# Patient Record
Sex: Male | Born: 1960
Health system: Southern US, Community
[De-identification: ages and names within clinical notes are randomized; demographics above are authoritative.]

## PROBLEM LIST (undated history)

## (undated) DIAGNOSIS — I639 Cerebral infarction, unspecified: Secondary | ICD-10-CM

## (undated) DIAGNOSIS — R569 Unspecified convulsions: Secondary | ICD-10-CM

---

## 2007-08-02 ENCOUNTER — Emergency Department (HOSPITAL_COMMUNITY): Admission: EM | Admit: 2007-08-02 | Discharge: 2007-08-02 | Payer: Self-pay | Admitting: Family Medicine

## 2007-08-07 ENCOUNTER — Emergency Department (HOSPITAL_COMMUNITY): Admission: EM | Admit: 2007-08-07 | Discharge: 2007-08-07 | Payer: Self-pay | Admitting: Emergency Medicine

## 2007-08-14 ENCOUNTER — Emergency Department (HOSPITAL_COMMUNITY): Admission: EM | Admit: 2007-08-14 | Discharge: 2007-08-14 | Payer: Self-pay | Admitting: Family Medicine

## 2010-05-31 DIAGNOSIS — J189 Pneumonia, unspecified organism: Secondary | ICD-10-CM

## 2010-05-31 HISTORY — DX: Pneumonia, unspecified organism: J18.9

## 2011-02-22 LAB — I-STAT 8, (EC8 V) (CONVERTED LAB)
Acid-base deficit: 1
BUN: 10
Bicarbonate: 24.9 — ABNORMAL HIGH
Chloride: 109
Glucose, Bld: 94
HCT: 45
Hemoglobin: 15.3
Operator id: 151321
Potassium: 4.3
Sodium: 139
TCO2: 26
pCO2, Ven: 45.6
pH, Ven: 7.345 — ABNORMAL HIGH

## 2011-02-22 LAB — DIFFERENTIAL
Basophils Absolute: 0
Basophils Relative: 1
Eosinophils Absolute: 0.3
Eosinophils Relative: 6 — ABNORMAL HIGH
Lymphocytes Relative: 28
Lymphs Abs: 1.7
Monocytes Absolute: 0.5
Monocytes Relative: 9
Neutro Abs: 3.3
Neutrophils Relative %: 56

## 2011-02-22 LAB — CBC
HCT: 40.5
Hemoglobin: 13.6
MCHC: 33.7
MCV: 90.6
Platelets: 295
RBC: 4.47
RDW: 13
WBC: 5.9

## 2011-02-22 LAB — POCT I-STAT CREATININE
Creatinine, Ser: 1.2
Operator id: 151321

## 2012-02-19 ENCOUNTER — Emergency Department (HOSPITAL_COMMUNITY): Payer: Self-pay

## 2012-02-19 ENCOUNTER — Encounter (HOSPITAL_COMMUNITY): Payer: Self-pay | Admitting: *Deleted

## 2012-02-19 ENCOUNTER — Emergency Department (HOSPITAL_COMMUNITY)
Admission: EM | Admit: 2012-02-19 | Discharge: 2012-02-19 | Disposition: A | Discharge status: Home / Self Care | Payer: Self-pay | Attending: Emergency Medicine | Admitting: Emergency Medicine

## 2012-02-19 DIAGNOSIS — M25469 Effusion, unspecified knee: Secondary | ICD-10-CM | POA: Insufficient documentation

## 2012-02-19 DIAGNOSIS — M25569 Pain in unspecified knee: Secondary | ICD-10-CM | POA: Insufficient documentation

## 2012-02-19 LAB — BASIC METABOLIC PANEL
BUN: 12 mg/dL (ref 6–23)
CO2: 26 mEq/L (ref 19–32)
Calcium: 9.9 mg/dL (ref 8.4–10.5)
Chloride: 101 mEq/L (ref 96–112)
Creatinine, Ser: 1.17 mg/dL (ref 0.50–1.35)
GFR calc Af Amer: 82 mL/min — ABNORMAL LOW (ref >90)
GFR calc non Af Amer: 71 mL/min — ABNORMAL LOW (ref >90)
Glucose, Bld: 136 mg/dL — ABNORMAL HIGH (ref 70–99)
Potassium: 4.3 mEq/L (ref 3.5–5.1)
Sodium: 136 mEq/L (ref 135–145)

## 2012-02-19 LAB — CBC WITH DIFFERENTIAL/PLATELET
Basophils Absolute: 0 10*3/uL (ref 0.0–0.1)
Basophils Relative: 0 % (ref 0–1)
Eosinophils Absolute: 0 10*3/uL (ref 0.0–0.7)
Eosinophils Relative: 0 % (ref 0–5)
HCT: 41 % (ref 39.0–52.0)
Hemoglobin: 14.1 g/dL (ref 13.0–17.0)
Lymphocytes Relative: 8 % — ABNORMAL LOW (ref 12–46)
Lymphs Abs: 1 10*3/uL (ref 0.7–4.0)
MCH: 31 pg (ref 26.0–34.0)
MCHC: 34.4 g/dL (ref 30.0–36.0)
MCV: 90.1 fL (ref 78.0–100.0)
Monocytes Absolute: 1.2 10*3/uL — ABNORMAL HIGH (ref 0.1–1.0)
Monocytes Relative: 10 % (ref 3–12)
Neutro Abs: 10.4 10*3/uL — ABNORMAL HIGH (ref 1.7–7.7)
Neutrophils Relative %: 82 % — ABNORMAL HIGH (ref 43–77)
Platelets: 268 10*3/uL (ref 150–400)
RBC: 4.55 MIL/uL (ref 4.22–5.81)
RDW: 12.1 % (ref 11.5–15.5)
WBC: 12.6 10*3/uL — ABNORMAL HIGH (ref 4.0–10.5)

## 2012-02-19 LAB — URIC ACID: Uric Acid, Serum: 5.5 mg/dL (ref 4.0–7.8)

## 2012-02-19 LAB — SEDIMENTATION RATE: Sed Rate: 10 mm/hr (ref 0–16)

## 2012-02-19 MED ORDER — OXYCODONE-ACETAMINOPHEN 5-325 MG PO TABS
2.0000 | ORAL_TABLET | Freq: Once | ORAL | Status: AC
  Administered 2012-02-19: 2 via ORAL
  Filled 2012-02-19: qty 2

## 2012-02-19 MED ORDER — OXYCODONE-ACETAMINOPHEN 5-325 MG PO TABS: ORAL_TABLET | ORAL | Status: DC

## 2012-02-19 NOTE — ED Notes (Signed)
Pt c/o R knee pain and swelling starting yesterday afternoon when driving a new vehicle. Pt states pain decreased last night and worsened today after driving same car again. Pt last took 800 mg ibuprofen today at 1400 and applied ice w/o relief today. Pt states he is unable to bend, straighten, or bear weight on R knee at this time.

## 2012-02-19 NOTE — ED Provider Notes (Addendum)
History     CSN: [id number]  Arrival date & time 02/19/12  1818   First MD Initiated Contact with Patient 02/19/12 1924      Chief Complaint  Patient presents with  . Knee Pain    (Consider location/radiation/quality/duration/timing/severity/associated sxs/prior treatment) HPI  51 y.o. male in no acute dress complaining of pain to right knee worsening over the course of the last day. Patient denies any trauma. States that he was driving a new vehicle and it became uncomfortable yesterday. Denies trauma. Pain is minimally relieved with 800mg  Ibuprofen. Denies fever, penile discharge, history of STDs  History reviewed. No pertinent past medical history.  History reviewed. No pertinent past surgical history.  History reviewed. No pertinent family history.  History  Substance Use Topics  . Smoking status: Former Smoker    Types: Cigars  . Smokeless tobacco: Not on file  . Alcohol Use: Yes     occasionally      Review of Systems  Constitutional: Negative for fever.  Respiratory: Negative for shortness of breath.   Cardiovascular: Negative for chest pain.  Gastrointestinal: Negative for nausea, vomiting, abdominal pain and diarrhea.  Musculoskeletal: Positive for arthralgias.  All other systems reviewed and are negative.    Allergies  Review of patient's allergies indicates no known allergies.  Home Medications   Current Outpatient Rx  Name Route Sig Dispense Refill  . IBUPROFEN 200 MG PO TABS Oral Take 800 mg by mouth every 6 (six) hours as needed. pain      BP 115/78  Pulse 85  Temp 98.3 F (36.8 C) (Oral)  Resp 20  SpO2 98%  Physical Exam  Nursing note and vitals reviewed. Constitutional: He is oriented to person, place, and time. He appears well-developed and well-nourished. No distress.  HENT:  Head: Normocephalic.  Eyes: Conjunctivae normal and EOM are normal.  Cardiovascular: Normal rate.   Pulmonary/Chest: Effort normal. No stridor.  Abdominal:  Soft.  Musculoskeletal: Normal range of motion.       Right Knee: No deformity, erythema or abrasions. trace warmth  FROM.  mild effusion, No crepitance. Anterior and posterior drawer show no abnormal laxity. Stable to valgus and varus stress. Joint lines are non-tender. Neurovascularly intact. Pt ambulates with mildly antalgic gait.   Neurological: He is alert and oriented to person, place, and time.  Psychiatric: He has a normal mood and affect.    ED Course  Procedures (including critical care time)  Labs Reviewed  CBC WITH DIFFERENTIAL - Abnormal; Notable for the following:    WBC 12.6 (*)     Neutrophils Relative 82 (*)     Neutro Abs 10.4 (*)     Lymphocytes Relative 8 (*)     Monocytes Absolute 1.2 (*)     All other components within normal limits  BASIC METABOLIC PANEL - Abnormal; Notable for the following:    Glucose, Bld 136 (*)     GFR calc non Af Amer 71 (*)     GFR calc Af Amer 82 (*)     All other components within normal limits  URIC ACID  SEDIMENTATION RATE   Dg Knee Complete 4 Views Right  02/19/2012  *RADIOLOGY REPORT*  Clinical Data: Knee pain/swelling  RIGHT KNEE - COMPLETE 4+ VIEW  Comparison: None.  Findings: No fracture or dislocation is seen.  Mild degenerative changes in the patellofemoral joint. Suprapatellar and infrapatellar enthesopathy.  Moderate suprapatellar knee joint effusion.  IMPRESSION: No fracture or dislocation is seen.  Moderate suprapatellar  knee joint effusion.   Original Report Authenticated By: Charline Bills, M.D.      1. Knee pain, acute   2. Knee effusion       MDM  Afebrile male with significantly worsening right knee pain and swelling over the last 24 hours. The knee is warm to the touch.  X-ray is negative patient has a slightly elevated white blood cell count at 12.6. However uric acid and sedimentation rate are negative. Discussed case with attending Dr. Effie Shy who believes he is stable for DC with orthopedic  followup.  Patient will be given crutches, Ace wrap, prescription for Percocet and orthopedic referral.  New Prescriptions   OXYCODONE-ACETAMINOPHEN (PERCOCET/ROXICET) 5-325 MG PER TABLET    1 to 2 tabs PO q6hrs  PRN for pain        Wynetta Emery, PA-C 02/19/12 2228  Wynetta Emery, PA-C 03/15/12 1544

## 2012-02-22 NOTE — ED Provider Notes (Signed)
Medical screening examination/treatment/procedure(s) were performed by non-physician practitioner and as supervising physician I was immediately available for consultation/collaboration.  Flint Melter, MD 02/22/12 [phone]

## 2012-03-16 NOTE — ED Provider Notes (Signed)
Medical screening examination/treatment/procedure(s) were performed by non-physician practitioner and as supervising physician I was immediately available for consultation/collaboration.  Flint Melter, MD 03/16/12 [phone]

## 2016-03-20 ENCOUNTER — Emergency Department (HOSPITAL_COMMUNITY): Payer: Self-pay

## 2016-03-20 ENCOUNTER — Encounter (HOSPITAL_COMMUNITY): Payer: Self-pay | Admitting: *Deleted

## 2016-03-20 ENCOUNTER — Emergency Department (HOSPITAL_COMMUNITY)
Admission: EM | Admit: 2016-03-20 | Discharge: 2016-03-20 | Disposition: A | Discharge status: Home / Self Care | Payer: Self-pay | Attending: Emergency Medicine | Admitting: Emergency Medicine

## 2016-03-20 DIAGNOSIS — Z87891 Personal history of nicotine dependence: Secondary | ICD-10-CM | POA: Insufficient documentation

## 2016-03-20 DIAGNOSIS — J189 Pneumonia, unspecified organism: Secondary | ICD-10-CM | POA: Insufficient documentation

## 2016-03-20 LAB — URINALYSIS, ROUTINE W REFLEX MICROSCOPIC
Glucose, UA: NEGATIVE mg/dL
Ketones, ur: 40 mg/dL — AB
Nitrite: POSITIVE — AB
Protein, ur: 100 mg/dL — AB
Specific Gravity, Urine: 1.034 — ABNORMAL HIGH (ref 1.005–1.030)
pH: 6 (ref 5.0–8.0)

## 2016-03-20 LAB — COMPREHENSIVE METABOLIC PANEL
ALT: 69 U/L — ABNORMAL HIGH (ref 17–63)
AST: 38 U/L (ref 15–41)
Albumin: 4.2 g/dL (ref 3.5–5.0)
Alkaline Phosphatase: 75 U/L (ref 38–126)
Anion gap: 10 (ref 5–15)
BUN: 16 mg/dL (ref 6–20)
CO2: 24 mmol/L (ref 22–32)
Calcium: 9.4 mg/dL (ref 8.9–10.3)
Chloride: 101 mmol/L (ref 101–111)
Creatinine, Ser: 1.28 mg/dL — ABNORMAL HIGH (ref 0.61–1.24)
GFR calc Af Amer: >60 mL/min (ref >60)
GFR calc non Af Amer: >60 mL/min (ref >60)
Glucose, Bld: 112 mg/dL — ABNORMAL HIGH (ref 65–99)
Potassium: 3.9 mmol/L (ref 3.5–5.1)
Sodium: 135 mmol/L (ref 135–145)
Total Bilirubin: 2.4 mg/dL — ABNORMAL HIGH (ref 0.3–1.2)
Total Protein: 8.6 g/dL — ABNORMAL HIGH (ref 6.5–8.1)

## 2016-03-20 LAB — CBC WITH DIFFERENTIAL/PLATELET
Basophils Absolute: 0 10*3/uL (ref 0.0–0.1)
Basophils Relative: 0 %
Eosinophils Absolute: 0.1 10*3/uL (ref 0.0–0.7)
Eosinophils Relative: 1 %
HCT: 37.1 % — ABNORMAL LOW (ref 39.0–52.0)
Hemoglobin: 12.4 g/dL — ABNORMAL LOW (ref 13.0–17.0)
Lymphocytes Relative: 5 %
Lymphs Abs: 0.6 10*3/uL — ABNORMAL LOW (ref 0.7–4.0)
MCH: 30.5 pg (ref 26.0–34.0)
MCHC: 33.4 g/dL (ref 30.0–36.0)
MCV: 91.2 fL (ref 78.0–100.0)
Monocytes Absolute: 1.3 10*3/uL — ABNORMAL HIGH (ref 0.1–1.0)
Monocytes Relative: 11 %
Neutro Abs: 9.5 10*3/uL — ABNORMAL HIGH (ref 1.7–7.7)
Neutrophils Relative %: 83 %
Platelets: 290 10*3/uL (ref 150–400)
RBC: 4.07 MIL/uL — ABNORMAL LOW (ref 4.22–5.81)
RDW: 12.5 % (ref 11.5–15.5)
WBC: 11.5 10*3/uL — ABNORMAL HIGH (ref 4.0–10.5)

## 2016-03-20 LAB — I-STAT CG4 LACTIC ACID, ED: Lactic Acid, Venous: 1.62 mmol/L (ref 0.5–1.9)

## 2016-03-20 LAB — URINE MICROSCOPIC-ADD ON: Squamous Epithelial / LPF: NONE SEEN

## 2016-03-20 MED ORDER — SODIUM CHLORIDE 0.9 % IV BOLUS (SEPSIS)
1000.0000 mL | Freq: Once | INTRAVENOUS | Status: AC
  Administered 2016-03-20: 1000 mL via INTRAVENOUS

## 2016-03-20 MED ORDER — AZITHROMYCIN 250 MG PO TABS: 250.0000 mg | ORAL_TABLET | Freq: Every day | ORAL | 0 refills | Status: DC

## 2016-03-20 MED ORDER — ACETAMINOPHEN 325 MG PO TABS
650.0000 mg | ORAL_TABLET | Freq: Once | ORAL | Status: AC
  Administered 2016-03-20: 650 mg via ORAL
  Filled 2016-03-20: qty 2

## 2016-03-20 MED ORDER — DIPHENHYDRAMINE HCL 50 MG/ML IJ SOLN
25.0000 mg | Freq: Once | INTRAMUSCULAR | Status: AC
  Administered 2016-03-20: 25 mg via INTRAVENOUS
  Filled 2016-03-20: qty 1

## 2016-03-20 MED ORDER — METOCLOPRAMIDE HCL 5 MG/ML IJ SOLN
10.0000 mg | Freq: Once | INTRAMUSCULAR | Status: AC
  Administered 2016-03-20: 10 mg via INTRAVENOUS
  Filled 2016-03-20: qty 2

## 2016-03-20 MED ORDER — AZITHROMYCIN 250 MG PO TABS
500.0000 mg | ORAL_TABLET | Freq: Once | ORAL | Status: AC
  Administered 2016-03-20: 500 mg via ORAL
  Filled 2016-03-20: qty 2

## 2016-03-20 MED ORDER — DEXTROSE 5 % IV SOLN
1.0000 g | Freq: Once | INTRAVENOUS | Status: AC
  Administered 2016-03-20: 1 g via INTRAVENOUS
  Filled 2016-03-20: qty 10

## 2016-03-20 NOTE — ED Notes (Signed)
RN Ramond Marrow drawing labs

## 2016-03-20 NOTE — ED Triage Notes (Addendum)
Patient states he has had chills, headache, generalized body aches and loss of appetite x4 days.  Patient's highest temperature at home was 103.  He is afebrile in triage, but feels warm to touch.  Patient endorses nausea since yesterday, but denies vomiting and diarrhea.  Patient denies cough, sore throat, otalgia and rhinorrhea.

## 2016-03-20 NOTE — ED Provider Notes (Signed)
[facility] DEPT Provider Note   CSN: [id number] Arrival date & time: 03/20/16  1013     History   Chief Complaint CC: fever  HPI John Watson is a 55 y.o. male.  Patient with no significant PMH -- presents with 4 day history of headache, fever, decreased appetite, nausea and vomiting, mild cough. Patient denies ear pain, nasal congestion, sore throat. No vision change or loss. No neck pain or stiffness. No shortness of breath or chest pain. No diarrhea or urinary symptoms. No skin rashes or tick bites. 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 He has been taking Tylenol and TheraFlu prior to arrival. Denies IV drug use, dental infections. The onset of this condition was acute. The course is constant. Aggravating factors: none. Alleviating factors: none.        No past medical history on file.  There are no active problems to display for this patient.   History reviewed. No pertinent surgical history.     Home Medications    Prior to Admission medications   Medication Sig Start Date End Date Taking? Authorizing Provider  ibuprofen (ADVIL,MOTRIN) 200 MG tablet Take 800 mg by mouth every 6 (six) hours as needed for moderate pain.    Yes Historical Provider, MD  oxyCODONE-acetaminophen (PERCOCET/ROXICET) 5-325 MG per tablet 1 to 2 tabs PO q6hrs  PRN for pain Patient taking differently: Take 1-2 tablets by mouth every 6 (six) hours as needed for moderate pain.  02/19/12  Yes Nicole Pisciotta, PA-C  azithromycin (ZITHROMAX) 250 MG tablet Take 1 tablet (250 mg total) by mouth daily. 03/20/16   Carlisle Cater, PA-C    Family History No family history on file.  Social History Social History  Substance Use Topics  . Smoking status: Former Smoker    Types: Cigars  . Smokeless tobacco: Never Used  . Alcohol use Yes     Comment: occasionally     Allergies   Review of patient's allergies indicates no known allergies.   Review of  Systems Review of Systems  Constitutional: Positive for appetite change, fatigue and fever.  HENT: Negative for rhinorrhea and sore throat.   Eyes: Negative for redness.  Respiratory: Positive for cough (mild). Negative for shortness of breath and wheezing.   Cardiovascular: Negative for chest pain.  Gastrointestinal: Positive for nausea. Negative for abdominal pain, diarrhea and vomiting.  Genitourinary: Negative for dysuria.  Musculoskeletal: Negative for myalgias.  Skin: Negative for rash.  Neurological: Positive for headaches.     Physical Exam Updated Vital Signs BP 145/95   Pulse 107   Temp 98.7 F (37.1 C) (Oral)   Resp 20   Ht 5\' 11"  (1.803 m)   Wt 88 kg   SpO2 97%   BMI 27.06 kg/m   Physical Exam  Constitutional: He appears well-developed and well-nourished.  HENT:  Head: Normocephalic and atraumatic.  Right Ear: Tympanic membrane, external ear and ear canal normal.  Left Ear: Tympanic membrane, external ear and ear canal normal.  Nose: Nose normal.  Mouth/Throat: Oropharynx is clear and moist and mucous membranes are normal. No oropharyngeal exudate, posterior oropharyngeal edema or posterior oropharyngeal erythema.  Eyes: Conjunctivae are normal. Right eye exhibits no discharge. Left eye exhibits no discharge.  Neck: Normal range of motion. Neck supple.  Cardiovascular: Regular rhythm and normal heart sounds.  Tachycardia present.   No murmur heard. Pulmonary/Chest: Effort normal and breath sounds normal. No respiratory distress. He has no wheezes. He has  no rales.  Abdominal: Soft. He exhibits no mass. There is no tenderness. There is no guarding.  Musculoskeletal: Normal range of motion.  Neurological: He is alert.  Skin: Skin is warm and dry.  No splinter hemorrhages or nail findings.   Psychiatric: He has a normal mood and affect.  Nursing note and vitals reviewed.    ED Treatments / Results  Labs (all labs ordered are listed, but only abnormal  results are displayed) Labs Reviewed  COMPREHENSIVE METABOLIC PANEL - Abnormal; Notable for the following:       Result Value   Glucose, Bld 112 (*)    Creatinine, Ser 1.28 (*)    Total Protein 8.6 (*)    ALT 69 (*)    Total Bilirubin 2.4 (*)    All other components within normal limits  CBC WITH DIFFERENTIAL/PLATELET - Abnormal; Notable for the following:    WBC 11.5 (*)    RBC 4.07 (*)    Hemoglobin 12.4 (*)    HCT 37.1 (*)    Neutro Abs 9.5 (*)    Lymphs Abs 0.6 (*)    Monocytes Absolute 1.3 (*)    All other components within normal limits  URINALYSIS, ROUTINE W REFLEX MICROSCOPIC (NOT AT [facility]) - Abnormal; Notable for the following:    Color, Urine ORANGE (*)    Specific Gravity, Urine 1.034 (*)    Hgb urine dipstick LARGE (*)    Bilirubin Urine MODERATE (*)    Ketones, ur 40 (*)    Protein, ur 100 (*)    Nitrite POSITIVE (*)    Leukocytes, UA SMALL (*)    All other components within normal limits  URINE MICROSCOPIC-ADD ON - Abnormal; Notable for the following:    Bacteria, UA RARE (*)    All other components within normal limits  I-STAT CG4 LACTIC ACID, ED    EKG  EKG Interpretation None       Radiology Dg Chest 2 View  Result Date: 03/20/2016 CLINICAL DATA:  Cough and headache for 2 days. EXAM: CHEST  2 VIEW COMPARISON:  None. FINDINGS: Cardiomediastinal silhouette is unremarkable. There is patchy infiltrate/ pneumonia in lingula and left infrahilar region. Follow-up to resolution is recommended. IMPRESSION: Patchy infiltrate/pneumonia in lingula and left infrahilar region. Follow-up to resolution is recommended. No pulmonary edema. Electronically Signed   By: Lahoma Crocker M.D.   On: 03/20/2016 11:22    Procedures Procedures (including critical care time)  Medications Ordered in ED Medications  sodium chloride 0.9 % bolus 1,000 mL (0 mLs Intravenous Stopped 03/20/16 1333)  metoCLOPramide (REGLAN) injection 10 mg (10 mg Intravenous Given 03/20/16 1135)    diphenhydrAMINE (BENADRYL) injection 25 mg (25 mg Intravenous Given 03/20/16 1136)  azithromycin (ZITHROMAX) tablet 500 mg (500 mg Oral Given 03/20/16 1243)  sodium chloride 0.9 % bolus 1,000 mL (0 mLs Intravenous Stopped 03/20/16 1522)  acetaminophen (TYLENOL) tablet 650 mg (650 mg Oral Given 03/20/16 1332)  cefTRIAXone (ROCEPHIN) 1 g in dextrose 5 % 50 mL IVPB (0 g Intravenous Stopped 03/20/16 1606)     Initial Impression / Assessment and Plan / ED Course  I have reviewed the triage vital signs and the nursing notes.  Pertinent labs & imaging results that were available during my care of the patient were reviewed by me and considered in my medical decision making (see chart for details).  Clinical Course  Value Comment By Time  DG Chest 2 View (Reviewed) Leonard Schwartz, MD 10/21 1423   Patient seen and  examined. Medications ordered.   Vital signs reviewed and are as follows: BP 146/96   Pulse 115   Temp 99.6 F (37.6 C) (Oral)   Resp 18   Ht 5\' 11"  (1.803 m)   Wt 88 kg   SpO2 95%   BMI 27.06 kg/m   Additional fluids and tylenol ordered 2/2 elevated heart rate.   CXR Reviewed by myself and demonstrates a left-sided infiltrate. Patient treated with azithromycin.  Urine and case reviewed with Dr. Audie Pinto. Recommends giving a dose of Rocephin here. Otherwise, patient can be discharged to home. Patient told to return for recheck if symptoms worsen, if he experiences shortness of breath, difficulty breathing, chest pain, new symptoms or other concerns. Patient verbalizes understanding.  Heart rate improved with fluids and temperature control. Patient looks great at time of discharge. Breathing without distress.   BP 124/86   Pulse 100   Temp 100.9 F (38.3 C)   Resp 18   Ht 5\' 11"  (1.803 m)   Wt 88 kg   SpO2 93%   BMI 27.06 kg/m    Final Clinical Impressions(s) / ED Diagnoses   Final diagnoses:  Community acquired pneumonia of left lung, unspecified part of lung    Patient with Fever, workup demonstrates pneumonia. Lactate is normal. Patient tachycardic, improved with fluids. No difficulty breathing or respiratory distress. UA likely false positive nitrite due to urine color. Do not suspect UTI. Patient appears well, nontoxic. Vital signs improved with treatment in the ED. He appears well and stable for discharge to home. Return instructions as above.   New Prescriptions Discharge Medication List as of 03/20/2016  3:54 PM    START taking these medications   Details  azithromycin (ZITHROMAX) 250 MG tablet Take 1 tablet (250 mg total) by mouth daily., Starting Sat 03/20/2016, Print         Carlisle Cater, PA-C 03/20/16 1616    Leonard Schwartz, MD 03/21/16 [phone]

## 2016-03-20 NOTE — Discharge Instructions (Signed)
Please read and follow all provided instructions.  Your diagnoses today include:  1. Community acquired pneumonia of left lung, unspecified part of lung     Tests performed today include:  Blood counts and electrolytes  Chest x-ray -- shows pneumonia  Vital signs. See below for your results today.   Medications prescribed:   Azithromycin - antibiotic for respiratory infection  You have been prescribed an antibiotic medicine: take the entire course of medicine even if you are feeling better. Stopping early can cause the antibiotic not to work.  Take any prescribed medications only as directed.  Home care instructions:  Follow any educational materials contained in this packet.  Take the complete course of antibiotics that you were prescribed.   BE VERY CAREFUL not to take multiple medicines containing Tylenol (also called acetaminophen). Doing so can lead to an overdose which can damage your liver and cause liver failure and possibly death.   Follow-up instructions: Please follow-up with your primary care provider in the next 3 days for further evaluation of your symptoms and to ensure resolution of your infection.   Return instructions:   Please return to the Emergency Department if you experience worsening symptoms.   Return immediately with worsening breathing, worsening shortness of breath, or if you feel it is taking you more effort to breathe.   Please return if you have any other emergent concerns.  Additional Information:  Your vital signs today were: BP 125/86    Pulse 104    Temp 100.9 F (38.3 C)    Resp 18    Ht 5\' 11"  (1.803 m)    Wt 88 kg    SpO2 93%    BMI 27.06 kg/m  If your blood pressure (BP) was elevated above 135/85 this visit, please have this repeated by your doctor within one month. --------------

## 2020-01-14 ENCOUNTER — Inpatient Hospital Stay (HOSPITAL_COMMUNITY)
Admission: EM | Admit: 2020-01-14 | Discharge: 2020-01-17 | DRG: 041 | Disposition: A | Discharge status: Home / Self Care | Payer: Self-pay | Attending: Neurology | Admitting: Neurology

## 2020-01-14 ENCOUNTER — Inpatient Hospital Stay (HOSPITAL_COMMUNITY): Payer: Self-pay

## 2020-01-14 ENCOUNTER — Emergency Department (HOSPITAL_COMMUNITY): Payer: Self-pay

## 2020-01-14 ENCOUNTER — Encounter (HOSPITAL_COMMUNITY): Payer: Self-pay

## 2020-01-14 ENCOUNTER — Other Ambulatory Visit: Payer: Self-pay

## 2020-01-14 DIAGNOSIS — G40909 Epilepsy, unspecified, not intractable, without status epilepticus: Secondary | ICD-10-CM

## 2020-01-14 DIAGNOSIS — Z20822 Contact with and (suspected) exposure to covid-19: Secondary | ICD-10-CM | POA: Diagnosis present

## 2020-01-14 DIAGNOSIS — N179 Acute kidney failure, unspecified: Secondary | ICD-10-CM | POA: Diagnosis present

## 2020-01-14 DIAGNOSIS — I6340 Cerebral infarction due to embolism of unspecified cerebral artery: Principal | ICD-10-CM | POA: Diagnosis present

## 2020-01-14 DIAGNOSIS — Z87891 Personal history of nicotine dependence: Secondary | ICD-10-CM

## 2020-01-14 DIAGNOSIS — E785 Hyperlipidemia, unspecified: Secondary | ICD-10-CM | POA: Diagnosis present

## 2020-01-14 DIAGNOSIS — G939 Disorder of brain, unspecified: Secondary | ICD-10-CM | POA: Diagnosis present

## 2020-01-14 DIAGNOSIS — Z83438 Family history of other disorder of lipoprotein metabolism and other lipidemia: Secondary | ICD-10-CM

## 2020-01-14 DIAGNOSIS — Z8249 Family history of ischemic heart disease and other diseases of the circulatory system: Secondary | ICD-10-CM

## 2020-01-14 DIAGNOSIS — F17200 Nicotine dependence, unspecified, uncomplicated: Secondary | ICD-10-CM | POA: Diagnosis present

## 2020-01-14 DIAGNOSIS — R29701 NIHSS score 1: Secondary | ICD-10-CM | POA: Diagnosis present

## 2020-01-14 DIAGNOSIS — Z801 Family history of malignant neoplasm of trachea, bronchus and lung: Secondary | ICD-10-CM

## 2020-01-14 DIAGNOSIS — I639 Cerebral infarction, unspecified: Secondary | ICD-10-CM | POA: Diagnosis present

## 2020-01-14 DIAGNOSIS — G8194 Hemiplegia, unspecified affecting left nondominant side: Secondary | ICD-10-CM | POA: Diagnosis present

## 2020-01-14 DIAGNOSIS — R253 Fasciculation: Secondary | ICD-10-CM | POA: Diagnosis not present

## 2020-01-14 DIAGNOSIS — R569 Unspecified convulsions: Secondary | ICD-10-CM

## 2020-01-14 HISTORY — DX: Cerebral infarction, unspecified: I63.9

## 2020-01-14 LAB — COMPREHENSIVE METABOLIC PANEL
ALT: 18 U/L (ref 0–44)
AST: 18 U/L (ref 15–41)
Albumin: 4.3 g/dL (ref 3.5–5.0)
Alkaline Phosphatase: 43 U/L (ref 38–126)
Anion gap: 10 (ref 5–15)
BUN: 15 mg/dL (ref 6–20)
CO2: 25 mmol/L (ref 22–32)
Calcium: 9.5 mg/dL (ref 8.9–10.3)
Chloride: 104 mmol/L (ref 98–111)
Creatinine, Ser: 1.25 mg/dL — ABNORMAL HIGH (ref 0.61–1.24)
GFR calc Af Amer: >60 mL/min (ref >60)
GFR calc non Af Amer: >60 mL/min (ref >60)
Glucose, Bld: 135 mg/dL — ABNORMAL HIGH (ref 70–99)
Potassium: 4.2 mmol/L (ref 3.5–5.1)
Sodium: 139 mmol/L (ref 135–145)
Total Bilirubin: 1.2 mg/dL (ref 0.3–1.2)
Total Protein: 7.4 g/dL (ref 6.5–8.1)

## 2020-01-14 LAB — DIFFERENTIAL
Abs Immature Granulocytes: 0.02 10*3/uL (ref 0.00–0.07)
Basophils Absolute: 0 10*3/uL (ref 0.0–0.1)
Basophils Relative: 1 %
Eosinophils Absolute: 0.1 10*3/uL (ref 0.0–0.5)
Eosinophils Relative: 1 %
Immature Granulocytes: 0 %
Lymphocytes Relative: 24 %
Lymphs Abs: 1.2 10*3/uL (ref 0.7–4.0)
Monocytes Absolute: 0.5 10*3/uL (ref 0.1–1.0)
Monocytes Relative: 9 %
Neutro Abs: 3.3 10*3/uL (ref 1.7–7.7)
Neutrophils Relative %: 65 %

## 2020-01-14 LAB — URINALYSIS, ROUTINE W REFLEX MICROSCOPIC
Bacteria, UA: NONE SEEN
Bilirubin Urine: NEGATIVE
Glucose, UA: NEGATIVE mg/dL
Hgb urine dipstick: NEGATIVE
Ketones, ur: NEGATIVE mg/dL
Leukocytes,Ua: NEGATIVE
Nitrite: NEGATIVE
Protein, ur: 30 mg/dL — AB
Specific Gravity, Urine: 1.018 (ref 1.005–1.030)
pH: 8 (ref 5.0–8.0)

## 2020-01-14 LAB — CBC
HCT: 42.3 % (ref 39.0–52.0)
Hemoglobin: 13.7 g/dL (ref 13.0–17.0)
MCH: 30.6 pg (ref 26.0–34.0)
MCHC: 32.4 g/dL (ref 30.0–36.0)
MCV: 94.6 fL (ref 80.0–100.0)
Platelets: 270 10*3/uL (ref 150–400)
RBC: 4.47 MIL/uL (ref 4.22–5.81)
RDW: 11.9 % (ref 11.5–15.5)
WBC: 5 10*3/uL (ref 4.0–10.5)
nRBC: 0 % (ref 0.0–0.2)

## 2020-01-14 LAB — RAPID URINE DRUG SCREEN, HOSP PERFORMED
Amphetamines: NOT DETECTED
Barbiturates: NOT DETECTED
Benzodiazepines: NOT DETECTED
Cocaine: NOT DETECTED
Opiates: NOT DETECTED
Tetrahydrocannabinol: NOT DETECTED

## 2020-01-14 LAB — APTT: aPTT: 27 seconds (ref 24–36)

## 2020-01-14 LAB — ETHANOL: Alcohol, Ethyl (B): <10 mg/dL (ref <10)

## 2020-01-14 LAB — CBG MONITORING, ED: Glucose-Capillary: 141 mg/dL — ABNORMAL HIGH (ref 70–99)

## 2020-01-14 LAB — PROTIME-INR
INR: 1 (ref 0.8–1.2)
Prothrombin Time: 13 seconds (ref 11.4–15.2)

## 2020-01-14 LAB — SARS CORONAVIRUS 2 BY RT PCR (HOSPITAL ORDER, PERFORMED IN ~~LOC~~ HOSPITAL LAB): SARS Coronavirus 2: NEGATIVE

## 2020-01-14 MED ORDER — SODIUM CHLORIDE 0.9 % IV SOLN: 50.0000 mL | Freq: Once | INTRAVENOUS | Status: DC

## 2020-01-14 MED ORDER — ALTEPLASE (STROKE) FULL DOSE INFUSION: 0.9000 mg/kg | Freq: Once | INTRAVENOUS | Status: DC

## 2020-01-14 MED ORDER — LEVETIRACETAM IN NACL 1500 MG/100ML IV SOLN
1500.0000 mg | Freq: Once | INTRAVENOUS | Status: AC
  Administered 2020-01-14: 1500 mg via INTRAVENOUS
  Filled 2020-01-14: qty 100

## 2020-01-14 MED ORDER — IOHEXOL 350 MG/ML SOLN: 100.0000 mL | Freq: Once | INTRAVENOUS | Status: DC | PRN

## 2020-01-14 MED ORDER — STROKE: EARLY STAGES OF RECOVERY BOOK
Freq: Once | Status: AC
  Filled 2020-01-14: qty 1

## 2020-01-14 MED ORDER — LEVETIRACETAM IN NACL 500 MG/100ML IV SOLN
500.0000 mg | Freq: Two times a day (BID) | INTRAVENOUS | Status: DC
  Administered 2020-01-14: 500 mg via INTRAVENOUS
  Filled 2020-01-14 (×2): qty 100

## 2020-01-14 MED ORDER — ACETAMINOPHEN 325 MG PO TABS
650.0000 mg | ORAL_TABLET | ORAL | Status: DC | PRN
  Administered 2020-01-15 – 2020-01-16 (×4): 650 mg via ORAL
  Filled 2020-01-14 (×4): qty 2

## 2020-01-14 MED ORDER — GADOBUTROL 1 MMOL/ML IV SOLN
9.0000 mL | Freq: Once | INTRAVENOUS | Status: AC | PRN
  Administered 2020-01-14: 9 mL via INTRAVENOUS

## 2020-01-14 MED ORDER — ACETAMINOPHEN 650 MG RE SUPP: 650.0000 mg | RECTAL | Status: DC | PRN

## 2020-01-14 MED ORDER — ACETAMINOPHEN 160 MG/5ML PO SOLN: 650.0000 mg | ORAL | Status: DC | PRN

## 2020-01-14 MED ORDER — ALTEPLASE 100 MG IV SOLR
INTRAVENOUS | Status: AC
  Administered 2020-01-14: 80.9 mg via INTRAVENOUS
  Filled 2020-01-14: qty 100

## 2020-01-14 MED ORDER — ALTEPLASE (STROKE) FULL DOSE INFUSION
0.9000 mg/kg | Freq: Once | INTRAVENOUS | Status: AC
  Filled 2020-01-14: qty 100

## 2020-01-14 MED ORDER — SODIUM CHLORIDE 0.9 % IV SOLN: INTRAVENOUS | Status: DC

## 2020-01-14 MED ORDER — PANTOPRAZOLE SODIUM 40 MG IV SOLR
40.0000 mg | Freq: Every day | INTRAVENOUS | Status: DC
  Administered 2020-01-14 – 2020-01-15 (×2): 40 mg via INTRAVENOUS
  Filled 2020-01-14 (×2): qty 40

## 2020-01-14 MED ORDER — SENNOSIDES-DOCUSATE SODIUM 8.6-50 MG PO TABS: 1.0000 | ORAL_TABLET | Freq: Every evening | ORAL | Status: DC | PRN

## 2020-01-14 MED ORDER — IOHEXOL 350 MG/ML SOLN
150.0000 mL | Freq: Once | INTRAVENOUS | Status: AC | PRN
  Administered 2020-01-14: 100 mL via INTRAVENOUS

## 2020-01-14 MED ORDER — LORAZEPAM 2 MG/ML IJ SOLN
1.0000 mg | Freq: Once | INTRAMUSCULAR | Status: AC
  Administered 2020-01-14: 1 mg via INTRAVENOUS
  Filled 2020-01-14: qty 1

## 2020-01-14 NOTE — ED Notes (Signed)
CT perfusion in progress

## 2020-01-14 NOTE — ED Provider Notes (Signed)
[facility] EMERGENCY DEPARTMENT Provider Note   CSN: [id number] Arrival date & time: 01/14/20  0865     History Chief Complaint  Patient presents with  . Weakness    John Watson is a 59 y.o. male.  Patient is a 59 year old male with no significant past medical history.  He presents today for evaluation of left arm and leg weakness/numbness.  Patient tells me he woke this morning at 6 AM feeling well.  Close to 7 AM, he went to the bathroom and noticed that his left leg was weak, numb, and not doing what he wanted it to do.  Shortly thereafter, he developed a similar sensation to his left arm.  EMS was called and the patient was transported here.  His arm weakness has since resolved, but symptoms persist in the left leg.  He denies any headache or visual disturbances.  He denies any injury or trauma.  He denies any fevers or chills.  The history is provided by the patient.  Weakness Severity:  Moderate Onset quality:  Sudden Duration:  90 minutes Timing:  Constant Progression:  Improving Chronicity:  New Relieved by:  Nothing Worsened by:  Nothing Ineffective treatments:  None tried      History reviewed. No pertinent past medical history.  There are no problems to display for this patient.   History reviewed. No pertinent surgical history.     No family history on file.  Social History   Tobacco Use  . Smoking status: Former Smoker    Types: Cigars  . Smokeless tobacco: Never Used  Substance Use Topics  . Alcohol use: Yes    Comment: occasionally  . Drug use: No    Home Medications Prior to Admission medications   Medication Sig Start Date End Date Taking? Authorizing Provider  azithromycin (ZITHROMAX) 250 MG tablet Take 1 tablet (250 mg total) by mouth daily. 03/20/16   Carlisle Cater, PA-C  ibuprofen (ADVIL,MOTRIN) 200 MG tablet Take 800 mg by mouth every 6 (six) hours as needed for moderate pain.     [provider]   oxyCODONE-acetaminophen (PERCOCET/ROXICET) 5-325 MG per tablet 1 to 2 tabs PO q6hrs  PRN for pain Patient taking differently: Take 1-2 tablets by mouth every 6 (six) hours as needed for moderate pain.  02/19/12   Pisciotta, Elmyra Ricks, PA-C    Allergies    Patient has no known allergies.  Review of Systems   Review of Systems  Neurological: Positive for weakness.  All other systems reviewed and are negative.   Physical Exam Updated Vital Signs BP (!) 154/89   Pulse 89   Temp 97.7 F (36.5 C) (Oral)   Resp 13   Ht 5\' 11"  (1.803 m)   Wt 83.9 kg   SpO2 95%   BMI 25.80 kg/m   Physical Exam Vitals and nursing note reviewed.  Constitutional:      General: He is not in acute distress.    Appearance: He is well-developed. He is not diaphoretic.  HENT:     Head: Normocephalic and atraumatic.  Eyes:     Extraocular Movements: Extraocular movements intact.     Pupils: Pupils are equal, round, and reactive to light.  Cardiovascular:     Rate and Rhythm: Normal rate and regular rhythm.     Heart sounds: No murmur heard.  No friction rub.  Pulmonary:     Effort: Pulmonary effort is normal. No respiratory distress.     Breath sounds: Normal breath sounds. No  wheezing or rales.  Abdominal:     General: Bowel sounds are normal. There is no distension.     Palpations: Abdomen is soft.     Tenderness: There is no abdominal tenderness.  Musculoskeletal:        General: Normal range of motion.     Cervical back: Normal range of motion and neck supple.  Skin:    General: Skin is warm and dry.  Neurological:     Mental Status: He is alert and oriented to person, place, and time.     Cranial Nerves: No cranial nerve deficit.     Sensory: No sensory deficit.     Motor: Weakness present.     Coordination: Coordination normal.     Comments: Hand grip and biceps flexion is normal to both upper extremities.  Patient has decreased strength with plantar flexion, dorsiflexion of the foot, and  hip flexion.     ED Results / Procedures / Treatments   Labs (all labs ordered are listed, but only abnormal results are displayed) Labs Reviewed  ETHANOL  PROTIME-INR  APTT  CBC  DIFFERENTIAL  COMPREHENSIVE METABOLIC PANEL  RAPID URINE DRUG SCREEN, HOSP PERFORMED  URINALYSIS, ROUTINE W REFLEX MICROSCOPIC  I-STAT CHEM 8, ED    EKG EKG Interpretation  Date/Time:  Monday January 14 2020 08:17:14 EDT Ventricular Rate:  91 PR Interval:    QRS Duration: 83 QT Interval:  373 QTC Calculation: 459 R Axis:   8 Text Interpretation: Sinus rhythm Borderline T wave abnormalities Baseline Wander Confirmed by Veryl Speak [phone]) on 01/14/2020 8:20:13 AM   Radiology No results found.  Procedures Procedures (including critical care time)  Medications Ordered in ED Medications - No data to display  ED Course  I have reviewed the triage vital signs and the nursing notes.  Pertinent labs & imaging results that were available during my care of the patient were reviewed by me and considered in my medical decision making (see chart for details).    MDM Rules/Calculators/A&P  Patient is a 59 year old male with no significant past medical history presenting with complaints of left arm and left leg weakness/numbness.  This began this morning at approximately 7 to 7:30 AM.  Due to the nature and time of onset of the symptoms, I initiated a code stroke after the patient's arrival here in the ER.  Initial head CT was negative and laboratory studies are essentially unremarkable.  Patient was evaluated by teleneurology.  His symptoms appear to be improving during their exam and additional studies including MRI was ordered.  While awaiting this test, the patient's symptoms began to worsen.  He is describing increased numbness and weakness to the left arm and left leg.  I then spoke with the teleneurologist who reevaluated him.  At this point, she felt as though administration of TPA was  appropriate.  This has been ordered and is being infused.  Additional studies including CTA of the head and neck as well as cerebral perfusion scan will be obtained.  I have also discussed the care with Dr. Leonel Ramsay from [facility] neurology.  Patient will be transferred to the [facility] ER due to no inpatient beds.  He will be evaluated by neurology and they will determine the final disposition.  CRITICAL CARE Performed by: Veryl Speak Total critical care time: 45 minutes Critical care time was exclusive of separately billable procedures and treating other patients. Critical care was necessary to treat or prevent imminent or life-threatening deterioration. Critical care  was time spent personally by me on the following activities: development of treatment plan with patient and/or surrogate as well as nursing, discussions with consultants, evaluation of patient's response to treatment, examination of patient, obtaining history from patient or surrogate, ordering and performing treatments and interventions, ordering and review of laboratory studies, ordering and review of radiographic studies, pulse oximetry and re-evaluation of patient's condition.   Final Clinical Impression(s) / ED Diagnoses Final diagnoses:  None    Rx / DC Orders ED Discharge Orders    None       Veryl Speak, MD 01/14/20 1427

## 2020-01-14 NOTE — Consult Note (Addendum)
[facility]urology Consult Services   Date of Service:   01/14/2020 08:38:17  Impression:     .  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 The symptoms are fluctuating whereby currently there is numbness and a heavy feeling in the L hand only. NIHSS 1 for sensory asymmetry. Would recommend stroke work-up as well as C-spine imaging to evaluate.  Metrics: Last Known Well: 01/14/2020 06:30:08 [facility] Notification Time: 01/14/2020 08:38:17 Arrival Time: 01/14/2020 08:10:42 Stamp Time: 01/14/2020 08:38:17 Time First Login Attempt: 01/14/2020 08:41:44 Symptoms: L sided weakness and sensory loss. NIHSS Start Assessment Time: 01/14/2020 08:45:44 Patient is not a candidate for Thrombolytic. Thrombolytic Medical Decision: 01/14/2020 08:50:56 Patient was not deemed candidate for Thrombolytic because of following reasons: Resolved symptoms (no residual disabling symptoms).  CT head showed no acute hemorrhage or acute core infarct.  ED Physician notified of diagnostic impression and management plan on 01/14/2020 08:58:03  Advanced Imaging: Advanced Imaging Not Recommended because:  Clinical Presentation is not Suggestive of LVO and NIHSS is <6   Our recommendations are outlined below.  Recommendations:     .  Activate Stroke Protocol Admission/Order Set     .  Stroke/Telemetry Floor     .  Neuro Checks     .  Bedside Swallow Eval     .  DVT Prophylaxis     .  IV Fluids, Normal Saline     .  Head of Bed 30 Degrees     .  Euglycemia and Avoid Hyperthermia (PRN Acetaminophen)     .  Initiate Aspirin 325 MG Daily     .  MRI brain and C-spine.  Routine Consultation with [facility] for Follow up Care  Sign Out:     .  Discussed with Emergency Department  Provider    ------------------------------------------------------------------------------  History of Present Illness: Patient is a 59 year old Male.  Patient was brought by EMS for symptoms of L sided weakness and sensory loss.  Pt woke up at 6:30am got up and went to the restroom ok but then when he turned to leave the bathroom he had sudden onset LLE weakness followed by LUE numbness. The LUE numbness seems to be fluctuating and LLE is twitching upon arrival. Meds: azithro, oxy, gabapentin    Past Medical History:     . There is NO history of Hypertension     . There is NO history of Diabetes Mellitus     . There is NO history of Hyperlipidemia     . There is NO history of Atrial Fibrillation     . There is NO history of Coronary Artery Disease     . There is NO history of Stroke     . drinks socially  Social History: Smoking: No Alcohol Use: No Drug Use: No  Review of System:  14 Points Review of Systems was performed and was negative except mentioned in HPI.  Anticoagulant use:  No  Antiplatelet use: No     Examination: BP(154/89), Pulse(89), Blood Glucose(141) 1A: Level of Consciousness - Alert; keenly responsive + 0 1B: Ask Month and Age - Both Questions Right + 0 1C: Blink Eyes & Squeeze Hands - Performs Both Tasks + 0 2: Test Horizontal Extraocular Movements - Normal + 0 3: Test Visual Fields - No Visual Loss + 0 4: Test Facial Palsy (Use Grimace if Obtunded) - Normal symmetry + 0  5A: Test Left Arm Motor Drift - No Drift for 10 Seconds + 0 5B: Test Right Arm Motor Drift - No Drift for 10 Seconds + 0 6A: Test Left Leg Motor Drift - No Drift for 5 Seconds + 0 6B: Test Right Leg Motor Drift - No Drift for 5 Seconds + 0 7: Test Limb Ataxia (FNF/Heel-Shin) - No Ataxia + 0 8: Test Sensation - Mild-Moderate Loss: Less Sharp/More Dull + 1 9: Test Language/Aphasia - Normal; No aphasia + 0 10: Test Dysarthria - Normal + 0 11: Test Extinction/Inattention - No  abnormality + 0  NIHSS Score: 1  Pre-Morbid Modified Rankin Scale: 0 Points = No symptoms at all   Patient/Family was informed the Neurology Consult would occur via TeleHealth consult by way of interactive audio and video telecommunications and consented to receiving care in this manner.   Patient is being evaluated for possible acute neurologic impairment and high probability of imminent or life-threatening deterioration. I spent total of 35 minutes providing care to this patient, including time for face to face visit via telemedicine, review of medical records, imaging studies and discussion of findings with providers, the patient and/or family.   Dr Elzie Rings   [facility] [phone]  Case [id number]  ER physician called back to report pt's symptoms are returning. Upon repeat evaluation via video, pt with L sided weakness and sensory loss. He is still within the window for IV tpa therefore after discussion with the pt we administered the drug. CTA head/neck showed no LVO therefore not a candidate for NIR. Would continue plan as above including post-IV tpa monitoring per protocol.  Recommendations: IV Alteplase recommended.  Thrombolytic bolus given Without Complication.   IV Alteplase/Activase Total Dose - 80.9 mg IV Alteplase/Activase Bolus Dose - 8.1 mg IV Alteplase/Activase Infusion Dose - 72.8 mg   Routine post Thrombolytic monitoring including neuro checks and blood pressure control during/after treatment Monitor blood pressure Check blood pressure and neuro assessment every 15 min for 2 h, then every 30 min for 6 h, and finally every hour for 16 h.  Manage Blood Pressure per post Thrombolytic protocol.      .  Admission to ICU     .  CT brain 24 hours post Thrombolytic     .  NPO until swallowing screen performed and passed     .  No antiplatelet agents or anticoagulants (including heparin for DVT prophylaxis) in first 24 hours     .  No Foley  catheter, nasogastric tube, arterial catheter or central venous catheter for 24 hr, unless absolutely necessary     .  Telemetry     .  Bedside swallow evaluation     .  HOB less than 30 degrees     .  Euglycemia     .  Avoid hyperthermia, PRN acetaminophen     .  DVT prophylaxis     .  Inpatient Neurology Consultation     .  Stroke evaluation as per inpatient neurology recommendations  Discussed with ED physician

## 2020-01-14 NOTE — Progress Notes (Signed)
Patient arrived to unit, VSS, NAD noted. [employer] patient information forms provided, patient agreeable, denies questions/concerns at this time.

## 2020-01-14 NOTE — Plan of Care (Signed)
Patient stable, discussed POC with patient, agreeable with plan. Discussed stroke education per stroke handbook via teach back, verbalizes understanding, denies question/concerns at this time.    Problem: Education: Goal: Knowledge of General Education information will improve Description: Including pain rating scale, medication(s)/side effects and non-pharmacologic comfort measures Outcome: Progressing   Problem: Coping: Goal: Level of anxiety will decrease Outcome: Progressing   Problem: Education: Goal: Knowledge of disease or condition will improve Outcome: Progressing

## 2020-01-14 NOTE — ED Notes (Signed)
TPA complete

## 2020-01-14 NOTE — Procedures (Signed)
Patient Name: John Watson  MRN: [id number]  Epilepsy Attending: Lora Havens  Referring Physician/Provider: Etta Quill, PA Date: 01/14/2020 Duration: 22.03 minutes  Patient history: 59 year old male with sudden onset left upper and lower extremity weakness.  EEG to evaluate for seizures.  Level of alertness: Awake  AEDs during EEG study: Keppra  Technical aspects: This EEG study was done with scalp electrodes positioned according to the 10-20 International system of electrode placement. Electrical activity was acquired at a sampling rate of 500Hz  and reviewed with a high frequency filter of 70Hz  and a low frequency filter of 1Hz . EEG data were recorded continuously and digitally stored.   Description: The posterior dominant rhythm consists of 9.5 Hz activity of moderate voltage (25-35 uV) seen predominantly in posterior head regions, symmetric and reactive to eye opening and eye closing. EEG showed intermittent 3 to 6 Hz theta-delta slowing in the right frontotemporal region.  Hyperventilation and photic stimulation were not performed.     ABNORMALITY -Intermittent slow, right frontotemporal region  IMPRESSION: This study is suggestive of cortical dysfunction in right frontotemporal region, nonspecific etiology.  No seizures or epileptiform discharges were seen throughout the recording.  Margot Oriordan Barbra Sarks

## 2020-01-14 NOTE — ED Notes (Signed)
TPA bolus given 957, continuous started 958TPA completed at 1050 hrs. Lett for CT again at 1055.  2nd neuro consult started 935am , on tv screen 936am

## 2020-01-14 NOTE — ED Notes (Signed)
CT calledCode STROKE

## 2020-01-14 NOTE — Progress Notes (Signed)
EEG complete - results pending Dr. Leonel Ramsay would like LTM after MRI new leads to be used

## 2020-01-14 NOTE — ED Notes (Signed)
[facility] called and truck is having issues. A different truck will come

## 2020-01-14 NOTE — ED Notes (Addendum)
Pt reports he woke up approx 0630 and went to BR  Pt states he flushed toilet and turned and felt his left leg did not fell like it was working, then reports at approx 071 his left arm went numb for approx 45 secs. Pt reports at this time left leg twitching and left arm feels numb again. Pt transported to CT

## 2020-01-14 NOTE — ED Triage Notes (Signed)
EMS brought pt from home c/o left side weakness. Pt moved himself from stretcher to bed, left arm moving to get out of the way off techs to hook up to monitors, left leg moving also. Episodes of light headness. Stated po intake good.

## 2020-01-14 NOTE — ED Notes (Signed)
Pt has received Covid vaccine x2.

## 2020-01-14 NOTE — ED Provider Notes (Signed)
Briefly seen after arrival to the ED from [place]. S/p TPA. L side still feels weak but overall improved. No new complaints since transfer. Neurology notified.    Virgel Manifold, MD 01/14/20 1320

## 2020-01-14 NOTE — ED Notes (Signed)
[facility] report

## 2020-01-14 NOTE — H&P (Addendum)
Neurology history and physical    CC: Left-sided decreased sensation of both arm and leg, now only leg.  History is obtained from: Patient  HPI: John Watson is a 59 y.o. male with no past medical history.  Patient states that he got up to go to the bathroom just prior to 630.  After finishing patient went to turn around and noticed that his left leg felt significantly weak and numb.  He was having a hard time walking with his leg.  About 30 minutes after he noticed that his left arm felt extremely heavy and had decreased sensation.  This only lasted for 45 seconds to 1 minute.  However his leg did not improve as far as sensation and it felt like dead weight.  Patient went to [facility], ER.  At that point teleneurology had seen the patient and consent was given to give TPA.  Patient was then transferred to [facility].  Currently patient states he still does have some decrease sensation subjectively, and doing fine movements with the left leg are difficult however the sensation has improved.  While talking to the patient he did mention that his left leg and left arm have been twitching intermittently and it is rhythmic.  At times it would be both leg and arm and at times will be either the leg and or the arm.  He states that this is not normal for him and started today.  He is completely alert and awake when it occurs but does not have control over it.  LKW: 0630 tpa given?: yes Premorbid modified Rankin scale (mRS): 0 NIHSS 1a Level of Conscious.: 0 1b LOC Questions: 0 1c LOC Commands: 0 2 Best Gaze: 0 3 Visual: 0 4 Facial Palsy: 0 5a Motor Arm - left: 0 5b Motor Arm - Right: 0 6a Motor Leg - Left: 0 6b Motor Leg - Right: 0 7 Limb Ataxia: 0 8 Sensory: 0 9 Best Language: 0 10 Dysarthria: 0 11 Extinct. and Inatten.: 0 TOTAL: 0  History reviewed. No pertinent past medical history on file and confirmed by patient   Family History  Problem Relation Age of Onset  .  Hypertension Mother   . Lung cancer Father   . Hyperlipidemia Sister   . Hyperlipidemia Brother    Social History:   reports that he has quit smoking. His smoking use included cigars. He has never used smokeless tobacco. He reports current alcohol use. He reports that he does not use drugs.  Medications  Current Facility-Administered Medications:  .   stroke: mapping our early stages of recovery book, , Does not apply, Once, Marliss Coots, PA-C .  0.9 %  sodium chloride infusion, , Intravenous, Continuous, Marliss Coots, PA-C .  acetaminophen (TYLENOL) tablet 650 mg, 650 mg, Oral, Q4H PRN **OR** acetaminophen (TYLENOL) 160 MG/5ML solution 650 mg, 650 mg, Per Tube, Q4H PRN **OR** acetaminophen (TYLENOL) suppository 650 mg, 650 mg, Rectal, Q4H PRN, Marliss Coots, PA-C .  alteplase (ACTIVASE) 1 mg/mL infusion 80.9 mg, 0.9 mg/kg, Intravenous, Once, Pirmohamed, Fermina, MD .  alteplase (ACTIVASE) 1 mg/mL injection, , , ,  .  iohexol (OMNIPAQUE) 350 MG/ML injection 100 mL, 100 mL, Intravenous, Once PRN, Veryl Speak, MD .  pantoprazole (PROTONIX) injection 40 mg, 40 mg, Intravenous, QHS, Smith, David R, PA-C .  senna-docusate (Senokot-S) tablet 1 tablet, 1 tablet, Oral, QHS PRN, Marliss Coots, PA-C  Current Outpatient Medications:  .  azithromycin (ZITHROMAX) 250 MG tablet,  Take 1 tablet (250 mg total) by mouth daily. (Patient not taking: Reported on 01/14/2020), Disp: 4 tablet, Rfl: 0 .  oxyCODONE-acetaminophen (PERCOCET/ROXICET) 5-325 MG per tablet, 1 to 2 tabs PO q6hrs  PRN for pain (Patient not taking: Reported on 01/14/2020), Disp: 15 tablet, Rfl: 0  ROS: .   General ROS: negative for - chills, fatigue, fever, night sweats, weight gain or weight loss Psychological ROS: negative for - behavioral disorder, hallucinations, memory difficulties, mood swings or suicidal ideation Ophthalmic ROS: negative for - blurry vision, double vision, eye pain or loss of vision ENT ROS: negative for -  epistaxis, nasal discharge, oral lesions, sore throat, tinnitus or vertigo Allergy and Immunology ROS: negative for - hives or itchy/watery eyes Hematological and Lymphatic ROS: negative for - bleeding problems, bruising or swollen lymph nodes Endocrine ROS: negative for - galactorrhea, hair pattern changes, polydipsia/polyuria or temperature intolerance Respiratory ROS: negative for - cough, hemoptysis, shortness of breath or wheezing Cardiovascular ROS: negative for - chest pain, dyspnea on exertion, edema or irregular heartbeat Gastrointestinal ROS: negative for - abdominal pain, diarrhea, hematemesis, nausea/vomiting or stool incontinence Genito-Urinary ROS: negative for - dysuria, hematuria, incontinence or urinary frequency/urgency Musculoskeletal ROS: Positive for - muscular weakness Neurological ROS: as noted in HPI Dermatological ROS: negative for rash and skin lesion changes  Exam: Current vital signs: BP (!) 145/83 (BP Location: Right Arm)   Pulse 95   Temp 98.3 F (36.8 C) (Oral)   Resp 17   Ht 5\' 11"  (1.803 m)   Wt 89.9 kg   SpO2 99%   BMI 27.63 kg/m  Vital signs in last 24 hours: Temp:  [97.7 F (36.5 C)-98.3 F (36.8 C)] 98.3 F (36.8 C) (08/16 1310) Pulse Rate:  [83-102] 95 (08/16 1310) Resp:  [9-24] 17 (08/16 1310) BP: (129-165)/(83-105) 145/83 (08/16 1310) SpO2:  [95 %-100 %] 99 % (08/16 1312) Weight:  [83.9 kg-89.9 kg] 89.9 kg (08/16 0947)   Constitutional: Appears well-developed and well-nourished.  Psych: Affect appropriate to situation Eyes: No scleral injection HENT: No OP obstrucion Head: Normocephalic.  Cardiovascular: Normal rate and regular rhythm.  Respiratory: Effort normal, non-labored breathing GI: Soft.  No distension. There is no tenderness.  Skin: WDI  Neuro: Mental Status: Patient is awake, alert, oriented to person, place, month, year, and situation.  Speech is clear without any dysarthria or aphasia.  Naming, repeating,  comprehension are intact.  Patient is able to follow complex commands.  He is also able to give a good history Cranial Nerves: II: Visual Fields are full.  III,IV, VI: EOMI without ptosis or diploplia. Pupils equal, round and reactive to light V: Facial sensation is symmetric to temperature VII: Facial movement is symmetric.  VIII: hearing is intact to voice X: Palat elevates symmetrically XI: Shoulder shrug is symmetric. XII: tongue is midline without atrophy or fasciculations.  Motor: Tone is normal. Bulk is normal. 5/5 strength was present in all four extremities.  He does not have a drift to the left leg but it does Mikki Santee.  Sensory: Sensation is symmetric to light touch and temperature in the arms and legs. DSS intact Deep Tendon Reflexes: 2+ and symmetric in the biceps and patellae.  Plantars: Mute bilaterally Cerebellar: Finger-nose-finger bilaterally intact heel-to-shin was intact on the right but the left he was having difficulty with.  Labs I have reviewed labs in epic and the results pertinent to this consultation are:  CBC    Component Value Date/Time   WBC 5.0 01/14/2020 0835  RBC 4.47 01/14/2020 0835   HGB 13.9 01/14/2020 0937   HCT 41.0 01/14/2020 0937   PLT 270 01/14/2020 0835   MCV 94.6 01/14/2020 0835   MCH 30.6 01/14/2020 0835   MCHC 32.4 01/14/2020 0835   RDW 11.9 01/14/2020 0835   LYMPHSABS 1.2 01/14/2020 0835   MONOABS 0.5 01/14/2020 0835   EOSABS 0.1 01/14/2020 0835   BASOSABS 0.0 01/14/2020 0835    CMP     Component Value Date/Time   NA 139 01/14/2020 0937   K 6.8 (HH) 01/14/2020 0937   CL 104 01/14/2020 0937   CO2 25 01/14/2020 0835   GLUCOSE 126 (H) 01/14/2020 0937   BUN 19 01/14/2020 0937   CREATININE 1.30 (H) 01/14/2020 0937   CALCIUM 9.5 01/14/2020 0835   PROT 7.4 01/14/2020 0835   ALBUMIN 4.3 01/14/2020 0835   AST 18 01/14/2020 0835   ALT 18 01/14/2020 0835   ALKPHOS 43 01/14/2020 0835   BILITOT 1.2 01/14/2020 0835   GFRNONAA  >60 01/14/2020 0835   GFRAA >60 01/14/2020 0835    Imaging I have reviewed the images obtained:  CT-scan of the brain-no acute intracranial hemorrhage or evidence of acute infarction.  CT angio of head and neck along with perfusion-no large vessel occlusion or hemodynamically significant stenosis.  Perfusion images demonstrates no evidence of core infarction and/or territory at rest.   Assessment:  This is a 59 year old male with no past medical history that he knows of.  Patient does not smoke and or use illicit drugs.  Patient does not take aspirin.  Patient had sudden onset of left arm and leg weakness along with decreased sensation.  Patient's arm weakness resolved quickly however he still feels as though his left leg is heavy and has subjective decreased sensation however not to light touch.  Patient did receive TPA at [facility] and was transferred to [facility].  Currently his main issue is heaviness in his leg.  As noted above with imaging there was no large vessel occlusion and or perfusion images demonstrating cord infarction and/or territory at risk.  Plan: -Admit to: 3 W. stepdown -Start simvastatin at 80 mg daily  -Hold Aspirin until 24 hour post tPA neuroimaging is stable and without evidence of bleeding -Blood pressure control, goal of SYS <180 -MRI/ECHO/A1C/Lipid panel. -PT/OT/ST therapies and recommendations when able  CNS -Close neuro monitoring  CV -Aggressive BP control, goal SBP <180 -Titrate oral agents, if need be use IV agents such as Cleviprex.  IV hydration Normal saline 75 cc/h  Patient has passed swallow screen  Etta Quill PA-C [facility] [phone]  M-F  (9:00 am- 5:00 PM)  01/14/2020, 2:01 PM   I have seen the patient reviewed the above note.  He had an acute change with left-sided deficits that started this morning.  The twitching that he has described could be consistent with simple partial seizures, but new onset  simple partial status epilepticus is unusual, and stroke would need to be considered as an etiology even in that situation.  I would favor giving 1 mg IV Ativan and loading with Keppra, but would not be overly aggressive given his mild deficits and preservation of his mental status.  Nonorganic etiology could also be considered, though I do not have significant reason to suspect that at this time.  He will need to be admitted for post TPA monitoring.  I will get an MRI, and then connected continuous EEG to assess for intermittent seizures.  Roland Rack, MD  [facility] [phone]  If 7pm- 7am, please page neurology on call as listed in Man.

## 2020-01-15 ENCOUNTER — Inpatient Hospital Stay (HOSPITAL_COMMUNITY): Payer: Self-pay

## 2020-01-15 ENCOUNTER — Encounter (HOSPITAL_COMMUNITY): Payer: Self-pay | Admitting: Neurology

## 2020-01-15 DIAGNOSIS — I6389 Other cerebral infarction: Secondary | ICD-10-CM

## 2020-01-15 DIAGNOSIS — I639 Cerebral infarction, unspecified: Secondary | ICD-10-CM

## 2020-01-15 LAB — I-STAT CHEM 8, ED
BUN: 19 mg/dL (ref 6–20)
Calcium, Ion: 1.16 mmol/L (ref 1.15–1.40)
Chloride: 104 mmol/L (ref 98–111)
Creatinine, Ser: 1.3 mg/dL — ABNORMAL HIGH (ref 0.61–1.24)
Glucose, Bld: 126 mg/dL — ABNORMAL HIGH (ref 70–99)
HCT: 41 % (ref 39.0–52.0)
Hemoglobin: 13.9 g/dL (ref 13.0–17.0)
Potassium: 6.8 mmol/L (ref 3.5–5.1)
Sodium: 139 mmol/L (ref 135–145)
TCO2: 27 mmol/L (ref 22–32)

## 2020-01-15 LAB — ECHOCARDIOGRAM COMPLETE
Area-P 1/2: 4.15 cm2
Height: 71 in
S' Lateral: 2.4 cm
Weight: 3169.6 oz

## 2020-01-15 LAB — BASIC METABOLIC PANEL
Anion gap: 8 (ref 5–15)
BUN: 10 mg/dL (ref 6–20)
CO2: 25 mmol/L (ref 22–32)
Calcium: 9.4 mg/dL (ref 8.9–10.3)
Chloride: 107 mmol/L (ref 98–111)
Creatinine, Ser: 1.07 mg/dL (ref 0.61–1.24)
GFR calc Af Amer: >60 mL/min (ref >60)
GFR calc non Af Amer: >60 mL/min (ref >60)
Glucose, Bld: 99 mg/dL (ref 70–99)
Potassium: 3.5 mmol/L (ref 3.5–5.1)
Sodium: 140 mmol/L (ref 135–145)

## 2020-01-15 LAB — HEMOGLOBIN A1C
Hgb A1c MFr Bld: 4.8 % (ref 4.8–5.6)
Mean Plasma Glucose: 91.06 mg/dL

## 2020-01-15 LAB — LIPID PANEL
Cholesterol: 221 mg/dL — ABNORMAL HIGH (ref 0–200)
HDL: 44 mg/dL (ref >40)
LDL Cholesterol: 153 mg/dL — ABNORMAL HIGH (ref 0–99)
Total CHOL/HDL Ratio: 5 RATIO
Triglycerides: 118 mg/dL (ref <150)
VLDL: 24 mg/dL (ref 0–40)

## 2020-01-15 LAB — HIV ANTIBODY (ROUTINE TESTING W REFLEX): HIV Screen 4th Generation wRfx: NONREACTIVE

## 2020-01-15 MED ORDER — KETOROLAC TROMETHAMINE 30 MG/ML IJ SOLN
30.0000 mg | Freq: Three times a day (TID) | INTRAMUSCULAR | Status: DC | PRN
  Administered 2020-01-15: 30 mg via INTRAVENOUS
  Filled 2020-01-15: qty 1

## 2020-01-15 MED ORDER — ONDANSETRON HCL 4 MG/2ML IJ SOLN
4.0000 mg | Freq: Once | INTRAMUSCULAR | Status: AC
  Administered 2020-01-15: 4 mg via INTRAVENOUS
  Filled 2020-01-15: qty 2

## 2020-01-15 MED ORDER — SODIUM CHLORIDE 0.9 % IV SOLN
750.0000 mg | Freq: Two times a day (BID) | INTRAVENOUS | Status: DC
  Administered 2020-01-15 – 2020-01-16 (×2): 750 mg via INTRAVENOUS
  Filled 2020-01-15 (×5): qty 7.5

## 2020-01-15 MED ORDER — ASPIRIN EC 81 MG PO TBEC: 81.0000 mg | DELAYED_RELEASE_TABLET | Freq: Every day | ORAL | Status: DC

## 2020-01-15 MED ORDER — IOHEXOL 9 MG/ML PO SOLN
ORAL | Status: AC
  Administered 2020-01-15: 500 mL
  Filled 2020-01-15: qty 1000

## 2020-01-15 MED ORDER — IOHEXOL 9 MG/ML PO SOLN
500.0000 mL | ORAL | Status: AC
  Administered 2020-01-15: 500 mL via ORAL

## 2020-01-15 MED ORDER — ATORVASTATIN CALCIUM 80 MG PO TABS
80.0000 mg | ORAL_TABLET | Freq: Every day | ORAL | Status: DC
  Administered 2020-01-15 – 2020-01-16 (×2): 80 mg via ORAL
  Filled 2020-01-15 (×2): qty 1

## 2020-01-15 MED ORDER — ONDANSETRON HCL 4 MG/2ML IJ SOLN: 4.0000 mg | Freq: Four times a day (QID) | INTRAMUSCULAR | Status: DC | PRN

## 2020-01-15 MED ORDER — LEVETIRACETAM IN NACL 1000 MG/100ML IV SOLN
1000.0000 mg | Freq: Once | INTRAVENOUS | Status: AC
  Administered 2020-01-15: 1000 mg via INTRAVENOUS
  Filled 2020-01-15: qty 100

## 2020-01-15 MED ORDER — IOHEXOL 350 MG/ML SOLN
100.0000 mL | Freq: Once | INTRAVENOUS | Status: AC | PRN
  Administered 2020-01-15: 100 mL via INTRAVENOUS

## 2020-01-15 NOTE — Evaluation (Signed)
Physical Therapy Evaluation Patient Details Name: John Watson MRN: [id number] DOB: June 20, 1960 Today's Date: 01/15/2020   History of Present Illness  59 y.o. male with no past medical history presenting to [facility] with transient L sided weakness and decreased sensation w/ recurrent episodes. +twitching LUE and LLE with EEG showing slowing R frontotemporal region. Treated w/ tPA 01/14/2020 at 0957 and transferred to [place]. 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 Neurosurgery consulted and does not feel biopsy yet indicated. Recommending repeat MRI 4-6 weeks.   Clinical Impression   Pt admitted with above symptoms with above "unusual" MRI results. Mobility assessment limited by patient's dizziness (he describes it as feeling disoriented) and increasing headache in sitting. Orthostatic BPs assessed in supine and sitting were normal (did not attempt standing due to above symptoms). Did note twitching of LUE while sitting. Asked pt to squeeze my hands and he was then unable to relax his grip with left hand. Twitching appeared to resolve once returned to supine, however pt reported he could still feel his entire left side "tensing and relaxing."  RN made aware. Pt currently with functional limitations due to the deficits listed below (see PT Problem List). Pt will benefit from skilled PT to increase their independence and safety with mobility. Further assessment needed to determine appropriate discharge recommendations.       Follow Up Recommendations Other (comment) (TBD as medically tolerates incr activity)    Equipment Recommendations  Other (comment) (TBA with incr mobility)    Recommendations for Other Services OT consult     Precautions / Restrictions Precautions Precautions: Fall Precaution Comments: +dizziness; not orthostatic      Mobility  Bed Mobility Overal bed mobility: Needs  Assistance Bed Mobility: Supine to Sit;Sit to Supine     Supine to sit: Min guard Sit to supine: Min guard   General bed mobility comments: with return to supine, his torso began to pull/draw to his left while he was trying to lie down to his right; pt able to self-correct  Transfers                 General transfer comment: deferred due to pt's increasing dizziness, headache, and nausea  Ambulation/Gait             General Gait Details: NT; RN reports near fall when walking from bathroom with nursing  Stairs            Wheelchair Mobility    Modified Rankin (Stroke Patients Only) Modified Rankin (Stroke Patients Only) Pre-Morbid Rankin Score: No symptoms Modified Rankin: Severe disability     Balance Overall balance assessment: Needs assistance Sitting-balance support: No upper extremity supported;Feet supported Sitting balance-Leahy Scale: Fair Sitting balance - Comments: >fair not tested due to dizziness                                     Pertinent Vitals/Pain Pain Assessment: No/denies pain (in supine; with sitting incr headache; resolved in supine)    Home Living Family/patient expects to be discharged to:: Private residence Living Arrangements: Other relatives (brother and sister-in-law) Available Help at Discharge: Family;Available 24 hours/day (both retired) Type of Home: House Home Access: Stairs to enter Entrance Stairs-Rails: Psychiatric nurse of Steps: 6 Home Layout: One level Home Equipment: None      Prior Function Level of Independence: Independent  Comments: works in Water engineer at [employer]        Extremity/Trunk Assessment   Upper Extremity Assessment Upper Extremity Assessment: Defer to OT evaluation (noted rhythmic contractions in sitting)    Lower Extremity Assessment Lower Extremity Assessment: LLE deficits/detail LLE Deficits / Details:  strength 5/5; describes LLE feels heavy and feels a tensing and relaxing through his entire left side; reports numbness top/bottom of toes    Cervical / Trunk Assessment Cervical / Trunk Assessment: Normal  Communication   Communication: No difficulties  Cognition Arousal/Alertness: Awake/alert Behavior During Therapy: WFL for tasks assessed/performed (but fatigued) Overall Cognitive Status: Within Functional Limits for tasks assessed                                        General Comments General comments (skin integrity, edema, etc.): Pt described dizziness as "disoriented" and denied sense of spinning (although reports his entire life he gets spinning for a few seconds when he lies down    Exercises     Assessment/Plan    PT Assessment Patient needs continued PT services  PT Problem List Decreased activity tolerance;Decreased balance;Decreased mobility;Decreased knowledge of use of DME;Impaired sensation       PT Treatment Interventions DME instruction;Gait training;Stair training;Functional mobility training;Therapeutic activities;Therapeutic exercise;Balance training;Neuromuscular re-education;Patient/family education    PT Goals (Current goals can be found in the Care Plan section)  Acute Rehab PT Goals Patient Stated Goal: get well and go home PT Goal Formulation: With patient Time For Goal Achievement: 01/15/20 Potential to Achieve Goals: Good    Frequency Min 4X/week   Barriers to discharge        Co-evaluation               AM-PAC PT "6 Clicks" Mobility  Outcome Measure Help needed turning from your back to your side while in a flat bed without using bedrails?: None Help needed moving from lying on your back to sitting on the side of a flat bed without using bedrails?: None Help needed moving to and from a bed to a chair (including a wheelchair)?: A Lot Help needed standing up from a chair using your arms (e.g., wheelchair or bedside  chair)?: A Lot Help needed to walk in hospital room?: A Lot Help needed climbing 3-5 steps with a railing? : Total 6 Click Score: 15    End of Session   Activity Tolerance: Treatment limited secondary to medical complications (Comment) (dizziness) Patient left: in bed;with call bell/phone within reach;with bed alarm set;with family/visitor present;with SCD's reapplied Nurse Communication: Other (comment) (dizziness and LUE ?fasiculations) PT Visit Diagnosis: Dizziness and giddiness (R42);Other symptoms and signs involving the nervous system (R29.898)    Time: 6629-4765 PT Time Calculation (min) (ACUTE ONLY): 32 min   Charges:   PT Evaluation $PT Eval Low Complexity: 1 Low           Arby Barrette, PT Pager [phone]   Rexanne Mano 01/15/2020, 5:27 PM

## 2020-01-15 NOTE — Progress Notes (Signed)
OT Cancellation Note  Patient Details Name: [name] MRN: [id number] DOB: 08/29/1960   Cancelled Treatment:    Reason Eval/Treat Not Completed: Active bedrest order- pt on strict bedrest.  Will follow and see as able once activity orders are updated.    Jolaine Artist, OT Acute Rehabilitation Services Pager [phone] Office [phone]    [name] 01/15/2020, 7:31 AM

## 2020-01-15 NOTE — Consult Note (Signed)
   Providing Compassionate, Quality Care - Together  Neurosurgery Consult  Referring physician: Dr. Leonie Man Reason for referral: Brain lesion  Chief Complaint: Left-sided weakness  History of Present Illness: This is a 59 year old male with no significant past medical history that noted yesterday just after going to the restroom he had left-sided weakness and numbness.  He stated it started in his leg.  He also noted this similarly in his arm about 30 minutes later.  It lasted approximately 45 seconds to 1 minute.  The sensation changes are mostly still present, especially in the foot at this time.  He notes that they seem to come and go but they are more often and they are than not there.  He denies any bowel or bladder changes.  He denies any trauma.  He denies any history of fevers, recent travel, malignancy.  He was originally seen at [facility] in which he was given TPA.  MRI was performed which showed 2 right frontal parietal lesions, one enhancing concerning for either stroke or low-grade glioma.  He also had an episode of twitching on the left side which was concerning for seizures therefore he was given antiepileptics and an EEG was performed which was negative.  He works in the Occupational psychologist at [employer].  He also complains of headaches bilaterally that reach 8 out of 10 pain and are described as a pressure pain.  History reviewed. No pertinent past medical history. History reviewed. No pertinent surgical history.  Medications: I have reviewed the patient's current medications. Allergies: No Known Allergies  History reviewed. No pertinent family history. Social History:  has no history on file for tobacco use, alcohol use, and drug use.  ROS: 14 point review of systems was obtained which all pertinent positives and negatives are listed in HPI above.  Physical Exam:  Vital signs in last 24 hours: Temp:  [98 F (36.7 C)-98.3 F (36.8 C)] 98 F (36.7 C) (07/25 1814) Pulse  Rate:  [58-128] 65 (07/26 0746) Resp:  [11-18] 14 (07/26 0217) BP: (138-182)/(65-125) 153/88 (07/26 0700) SpO2:  [91 %-98 %] 96 % (07/26 0746) PE: A&O x3 NAD NCAT PERRLA Face symmetric Cranial nerves II through XII intact Sensory intact to light touch except for left toes and dorsum of the foot. Strength full bilateral upper and lower extremities Deep tendon reflexes, patellar and Achilles normal. No Hoffmann's bilaterally EOMI    Impression/Assessment:  59 year old male with  1.  Right frontal and right parietal lesions 2.  Left-sided weakness, resolved  Plan:  -MRI reviewed, given the multifocal nature of the lesions I recommend repeat MRI in 4 to 6 weeks at this time.  There is no significant mass-effect or edema surrounding the lesions.  The right frontal lesion has some small enhancement which could be a low-grade glioma however a low-grade glioma would not be multifocal.  Therefore I recommend repeat imaging in the short interval. -CT chest abdomen pelvis for eval of malignancy -Stroke work-up negative -I recommend no steroids at this time given there is no significant mass-effect. -Continue antiepileptics per neurology -PT OT -Pain control    Thank you for allowing me to participate in this patient's care.  Please do not hesitate to call with questions or concerns.   Elwin Sleight, [facility] Cell: [phone]

## 2020-01-15 NOTE — Progress Notes (Signed)
PT Cancellation Note  Patient Details Name: John Watson MRN: [id number] DOB: 03-02-1961   Cancelled Treatment:    Reason Eval/Treat Not Completed: Active bedrest order  pt on strict bedrest.  Will follow and see as able once activity orders are updated.     Arby Barrette, PT Pager [phone]  Rexanne Mano 01/15/2020, 8:44 AM

## 2020-01-15 NOTE — Progress Notes (Signed)
Pt c/o feeling bad with light headiness and dizziness with a 8/10 headache. Notified neurology of findings. Returned page with orders to continue to monitor for now.

## 2020-01-15 NOTE — Progress Notes (Signed)
Brief Neuro Note:  Was notified by RN Collie Siad that patient was experiencing Left sided symptoms. On my evaluation, patient endorses spasms of the left head, arm and leg that have been ongoing for the last 2 hours. I was unable to witness the spasm. He endorses that these are more subjective. He also endorsed headache, about 6-7 out of 10.  Recs: - I ordered Keppra 500mg  IV once and increased maintenance Keppra to 750mg  BID. - I ordered a CTH without contrast, given he is post tPA and has a CNS malignancy, important to rule out ICH.

## 2020-01-15 NOTE — Evaluation (Signed)
Speech Language Pathology Evaluation Patient Details Name: John Watson MRN: [id number] DOB: Jul 16, 1960 Today's Date: 01/15/2020 Time: 4627-0350 SLP Time Calculation (min) (ACUTE ONLY): 22 min  Problem List:  Patient Active Problem List   Diagnosis Date Noted  . 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 No pertinent past medical history. Past Surgical History: History reviewed. No pertinent surgical history. HPI:  Pt is a 59 y.o. male with no past medical history who presented to the ED at [facility] secondary to left leg weakness and numbness. TPA was given and pt was transferred to [facility]. MRI brain w/o contrast: Cortical edema with restricted diffusion in the right medial frontal lobe. Subacute infarct vs seizure related activity, and less likely tumor. 23 mm cortically based lesion in the right parietal lobe with mild mass-effect. MRI brain with contrast: Area of poorly circumscribed contrast enhancement associated with the right frontal lesion, but not with the right parietal lesion. 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 On 8/16 pt reported spasms of the left head, arm and leg that had been ongoing for 2 hours. CT head was conducted to rule out ICH and showed low-density right parietal and high-density right frontal lesions. EEG 8/16: Intermittent slow, right frontotemporal region; cortical dysfunction in right frontotemporal region.   Assessment / Plan / Recommendation Clinical Impression  Pt participated in speech/language/cognition evaluation evaluation with his brother present. Pt reported that he has an associate's degree in criminal justice. He is employed full time at [employer] in the [employer] and is known to this SLP within that context. He denied any baseline deficits or acute changes in speech, language, or cognition. Pt reported that he was fatigued and "has never been good at  [employer]". The impact of these factors on his performance is considered. [facility] Mental Status Examination was completed to evaluate the pt's cognitive-linguistic skills. He achieved a score of 26/30 which is slightly below the normal limits of 27 or more out of 30. No speech or language deficits were noted. His performance on informal cognitive-linguistic tasks was within normal limits. Acute SLP services will be discontinued at this time, but pt has agreed to follow up with his MD if he notices increased cognitive-linguistic difficulty after discharge.     SLP Assessment  SLP Recommendation/Assessment: Patient does not need any further Speech Lanaguage Pathology Services SLP Visit Diagnosis: Cognitive communication deficit (R41.841)    Follow Up Recommendations  None    Frequency and Duration           SLP Evaluation Cognition  Overall Cognitive Status: Within Functional Limits for tasks assessed Arousal/Alertness: Awake/alert Orientation Level: Oriented X4 Attention: Focused;Sustained Focused Attention: Appears intact Sustained Attention: Appears intact Memory: Appears intact (Immediate: 5/5; delayed: 5/5) Awareness: Appears intact Problem Solving: Appears intact Executive Function: Reasoning;Sequencing;Organizing Reasoning: Appears intact Sequencing: Appears intact (Clock drawing 2/4; did not place hour markers) Organizing: Appears intact (Backward digit span: 3/3)       Comprehension  Auditory Comprehension Overall Auditory Comprehension: Appears within functional limits for tasks assessed Yes/No Questions: Within Functional Limits Commands: Within Functional Limits Conversation: Complex Visual Recognition/Discrimination Discrimination: Within Function Limits Reading Comprehension Reading Status: Within funtional limits    Expression Expression Primary Mode of Expression: Verbal Verbal Expression Overall Verbal Expression: Appears within functional  limits for tasks assessed Initiation: No impairment Level of Generative/Spontaneous Verbalization: Conversation Repetition: No impairment Naming: No impairment Pragmatics: No impairment   Oral / Motor  Oral Motor/Sensory Function Overall Oral Motor/Sensory Function: Within functional limits Motor Speech Overall Motor Speech: Appears within functional limits for tasks assessed Respiration: Within functional limits Phonation: Normal Resonance: Within functional limits Articulation: Within functional limitis Intelligibility: Intelligible   John Watson I. Hardin Negus, [place], [place] Office number [phone] Pager [phone]                    Horton Marshall 01/15/2020, 5:33 PM

## 2020-01-15 NOTE — Discharge Summary (Addendum)
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 Swallow whole. Start taking on: January 18, 2020   atorvastatin 80 MG tablet Commonly known as: LIPITOR Take 1 tablet (80 mg total) by mouth daily.   clopidogrel 75 MG tablet Commonly known as: PLAVIX Take 1 tablet (75 mg total) by mouth daily for 21 days. Start taking on: January 18, 2020   levETIRAcetam 750 MG tablet Commonly known as: KEPPRA Take 1 tablet (750 mg total) by mouth 2 (two) times daily.        LABORATORY STUDIES CBC    Component Value Date/Time   WBC 5.0 01/14/2020 0835   RBC 4.47 01/14/2020 0835   HGB 13.9 01/14/2020 0937   HCT 41.0 01/14/2020 0937   PLT 270 01/14/2020 0835   MCV 94.6 01/14/2020 0835   MCH 30.6 01/14/2020 0835   MCHC 32.4 01/14/2020 0835   RDW 11.9 01/14/2020 0835   LYMPHSABS 1.2 01/14/2020 0835   MONOABS 0.5 01/14/2020 0835   EOSABS 0.1 01/14/2020 0835   BASOSABS 0.0 01/14/2020 0835   CMP    Component Value Date/Time   NA 140 01/15/2020 0158   K 3.5 01/15/2020 0158   CL 107 01/15/2020 0158   CO2 25 01/15/2020 0158   GLUCOSE  99 01/15/2020 0158   BUN 10 01/15/2020 0158   CREATININE 1.07 01/15/2020 0158   CALCIUM 9.4 01/15/2020 0158   PROT 7.4 01/14/2020 0835   ALBUMIN 4.3 01/14/2020 0835   AST 18 01/14/2020 0835   ALT 18 01/14/2020 0835   ALKPHOS 43 01/14/2020 0835   BILITOT 1.2 01/14/2020 0835   GFRNONAA >60 01/15/2020 0158   GFRAA >60 01/15/2020 0158   COAGS Lab Results  Component Value Date   INR 1.0 01/14/2020   Lipid Panel    Component Value Date/Time   CHOL 221 (H) 01/15/2020 0158   TRIG 118 01/15/2020 0158   HDL 44 01/15/2020 0158   CHOLHDL 5.0 01/15/2020 0158   VLDL 24 01/15/2020 0158   LDLCALC 153 (H) 01/15/2020 0158   HgbA1C  Lab Results  Component Value Date   HGBA1C 4.8 01/15/2020   Urinalysis    Component Value Date/Time   COLORURINE YELLOW 01/14/2020 1040   APPEARANCEUR CLEAR 01/14/2020 1040   LABSPEC 1.018 01/14/2020 1040   PHURINE 8.0 01/14/2020 1040   GLUCOSEU NEGATIVE 01/14/2020 1040   HGBUR NEGATIVE 01/14/2020 1040   BILIRUBINUR NEGATIVE 01/14/2020 1040   KETONESUR NEGATIVE 01/14/2020 1040   PROTEINUR 30 (A) 01/14/2020 1040   NITRITE NEGATIVE 01/14/2020 1040   LEUKOCYTESUR  NEGATIVE 01/14/2020 1040   Urine Drug Screen     Component Value Date/Time   LABOPIA NONE DETECTED 01/14/2020 1040   COCAINSCRNUR NONE DETECTED 01/14/2020 1040   LABBENZ NONE DETECTED 01/14/2020 1040   AMPHETMU NONE DETECTED 01/14/2020 1040   THCU NONE DETECTED 01/14/2020 1040   LABBARB NONE DETECTED 01/14/2020 1040    Alcohol Level    Component Value Date/Time   ETH <10 01/14/2020 0835     SIGNIFICANT DIAGNOSTIC STUDIES EEG  Result Date: 01/14/2020 Lora Havens, MD     01/14/2020  3:43 PM Patient Name: John Watson MRN: [id number] Epilepsy Attending: Lora Havens Referring Physician/Provider: Etta Quill, PA Date: 01/14/2020 Duration: 22.03 minutes Patient history: 59 year old male with sudden onset left upper and lower extremity weakness.  EEG to evaluate for seizures.  Level of alertness: Awake AEDs during EEG study: Keppra Technical aspects: This EEG study was done with scalp electrodes positioned according to the 10-20 International system of electrode placement. Electrical activity was acquired at a sampling rate of '500Hz'  and reviewed with a high frequency filter of '70Hz'  and a low frequency filter of '1Hz' . EEG data were recorded continuously and digitally stored. Description: The posterior dominant rhythm consists of 9.5 Hz activity of moderate voltage (25-35 uV) seen predominantly in posterior head regions, symmetric and reactive to eye opening and eye closing. EEG showed intermittent 3 to 6 Hz theta-delta slowing in the right frontotemporal region.  Hyperventilation and photic stimulation were not performed.   ABNORMALITY -Intermittent slow, right frontotemporal region IMPRESSION: This study is suggestive of cortical dysfunction in right frontotemporal region, nonspecific etiology.  No seizures or epileptiform discharges were seen throughout the recording. Lora Havens   CT HEAD WO CONTRAST  Result Date: 01/15/2020 CLINICAL DATA:  Stroke follow-up EXAM: CT HEAD WITHOUT CONTRAST TECHNIQUE: Contiguous axial images were obtained from the base of the skull through the vertex without intravenous contrast. COMPARISON:  Head CT from yesterday FINDINGS: Brain: The parasagittal right posterior frontal lesion is mildly dense by CT. The lateral right parietal lesion is low-density. No hematoma, hydrocephalus, shift, or interval finding. Small remote left cerebellar infarction. Vascular: No hyperdense vessel or unexpected calcification. Skull: Negative Sinuses/Orbits: Negative IMPRESSION: Stable low-density right parietal and high-density right frontal lesions, see preceding brain MRI. Electronically Signed   By: Monte Fantasia M.D.   On: 01/15/2020 05:44   MR BRAIN WO CONTRAST  Result Date: 01/14/2020 CLINICAL DATA:  Follow-up stroke EXAM: MRI HEAD WITHOUT CONTRAST  TECHNIQUE: Multiplanar, multiecho pulse sequences of the brain and surrounding structures were obtained without intravenous contrast. COMPARISON:  CT head 01/14/2020 FINDINGS: Brain: Cortical edema in the right medial frontal lobe posteriorly with facilitated diffusion. Cortical edema in the right parietal cortex without abnormality on diffusion. This is higher signal on T2 than the lesion in the right frontal lobe and appears to have some masslike characteristics. No abnormality on diffusion-weighted imaging. Possible cortical tumor. This area measures approximately 23 mm in diameter. Few small scattered hyperintensities in the white matter bilaterally. Negative for hemorrhage or mass. Vascular: Normal arterial flow voids. Skull and upper cervical spine: No focal skeletal lesion. Sinuses/Orbits: Mild mucosal edema paranasal sinuses. Negative orbit Other: None IMPRESSION: Cortical edema with restricted diffusion in the right medial frontal lobe. Differential diagnosis includes subacute infarct, seizure related activity, and less likely tumor. 23 mm cortically based lesion in the right parietal lobe with mild mass-effect. 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 Negative for acute infarct Postcontrast imaging may be helpful.  Follow-up imaging may also be helpful to evaluate for evolutionary changes of ischemia. These results were called by telephone at the time of interpretation on 01/14/2020 at 4:24 pm to provider Leonel Ramsay, who verbally acknowledged these results. Electronically Signed   By: Franchot Gallo M.D.   On: 01/14/2020 16:25   MR BRAIN W CONTRAST  Result Date: 01/14/2020 CLINICAL DATA:  Brain mass EXAM: MRI HEAD WITH CONTRAST TECHNIQUE: Multiplanar, multiecho pulse sequences of the brain and surrounding structures were obtained with intravenous contrast. CONTRAST:  40m GADAVIST GADOBUTROL 1 MMOL/ML IV SOLN COMPARISON:  Brain MRI without contrast 01/14/2020 FINDINGS: In the paramedian superior right  frontal lobe, there is an area of poorly circumscribed contrast enhancement in the region of the FLAIR abnormality shown on the earlier study. The area of enhancement measures approximately 16 x 9 mm. There is no contrast enhancement at the site of the posterior right parietal lobe lesion. IMPRESSION: Area of poorly circumscribed contrast enhancement associated with the right frontal lesion, but not with the right parietal lesion. 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 Follow-up MRI with and without contrast in 4-6 weeks might be helpful for differentiation. Electronically Signed   By: KUlyses JarredM.D.   On: 01/14/2020 21:52   CT CHEST ABDOMEN PELVIS W CONTRAST  Result Date: 01/15/2020 CLINICAL DATA:  Brain lesion, malignancy eval. EXAM: CT CHEST, ABDOMEN, AND PELVIS WITH CONTRAST TECHNIQUE: Multidetector CT imaging of the chest, abdomen and pelvis was performed following the standard protocol during bolus administration of intravenous contrast. CONTRAST:  1070mOMNIPAQUE IOHEXOL 350 MG/ML SOLN COMPARISON:  None. FINDINGS: CT CHEST FINDINGS Cardiovascular: Atherosclerosis of the thoracic aorta. No aortic dissection. No pulmonary embolus to the lobar level. Heart is normal in size. No pericardial effusion. Mediastinum/Nodes: No enlarged mediastinal or hilar lymph nodes. There is no axillary or supraclavicular adenopathy. No esophageal wall thickening. No visualized thyroid nodule. Lungs/Pleura: No dominant pulmonary mass. There is a 3 mm perifissural nodule in the right middle lobe, series 3, image 65. Dependent densities in both lungs typical of hypoventilatory atelectasis. Minimal subsegmental atelectasis in the lingula. Trachea and central bronchi are patent. There is no pleural fluid. No findings of pulmonary edema. Musculoskeletal: No focal osseous lesion. Mild degenerative change in the thoracic spine. No acute osseous abnormality. No chest wall or subcutaneous lesion.  CT ABDOMEN PELVIS FINDINGS Hepatobiliary: There are multiple small subcentimeter low-density lesions throughout the liver that are too small to accurately characterize. Largest lesion measures 8 mm in the subcapsular right lobe, series 3, image 41. Mild background diffusely decreased hepatic density typical of steatosis. High-density material in the gallbladder is likely vicarious excretion from head and neck CTA yesterday. This outlines multiple intraluminal gallstones. There is no biliary dilatation or pericholecystic inflammation. Pancreas: No evidence of pancreatic mass. No ductal dilatation or inflammation. Spleen: Normal in size without focal abnormality. Adrenals/Urinary Tract: No adrenal nodule. No hydronephrosis or perinephric edema. Homogeneous renal enhancement with symmetric excretion on delayed phase imaging. 7 mm low-density lesion in the lower left kidney is nonspecific but likely represent cysts. Urinary bladder is physiologically distended without wall thickening. Stomach/Bowel: Stomach is distended with enteric contrast. There is no gastric wall thickening. No duodenal wall thickening. Normal positioning of the ligament of Treitz. Enteric contrast reaches the mid small bowel. There is no small bowel obstruction or inflammation. Normal appendix. Cecum is located in the midline pelvis. Small to moderate volume of stool throughout the colon. No obvious colonic wall thickening or mass. There  is no pericolonic edema. Vascular/Lymphatic: Mild aorto bi-iliac atherosclerosis. No aortic aneurysm. The portal vein is patent. No enlarged lymph nodes in the abdomen or pelvis. Reproductive: Enlarged prostate gland spans 6.6 cm transverse. Other: No ascites. No omental thickening or evidence of peritoneal disease. Tiny fat containing umbilical hernia. Musculoskeletal: Tiny punctate density in the right iliac bone is nonspecific but favored to represent a bone island. No suspicious bone lesion. No subcutaneous or  soft tissue lesion. IMPRESSION: 1. No definitive evidence of primary malignancy in the chest, abdomen, or pelvis. 2. Multiple small subcentimeter low-density lesions throughout the liver that are too small to accurately characterize, largest measures 8 mm. These are nonspecific in the setting of questioned malignancy, but in the absence of additional signs of malignancy are statistically benign cysts. 3. Tiny 3 mm perifissural right middle lobe pulmonary nodule, nonspecific. This may represent an intrapulmonary lymph node. Consider follow-up in 1 year. 4. Mild hepatic steatosis. 5. Cholelithiasis without gallbladder inflammation. 6. Enlarged prostate gland. Aortic Atherosclerosis (ICD10-I70.0). Electronically Signed   By: Keith Rake M.D.   On: 01/15/2020 20:45   CT CEREBRAL PERFUSION W CONTRAST  Result Date: 01/14/2020 CLINICAL DATA:  Code stroke follow-up, left-sided weakness EXAM: CT ANGIOGRAPHY HEAD AND NECK CT PERFUSION BRAIN TECHNIQUE: Multidetector CT imaging of the head and neck was performed using the standard protocol during bolus administration of intravenous contrast. Multiplanar CT image reconstructions and MIPs were obtained to evaluate the vascular anatomy. Carotid stenosis measurements (when applicable) are obtained utilizing NASCET criteria, using the distal internal carotid diameter as the denominator. Multiphase CT imaging of the brain was performed following IV bolus contrast injection. Subsequent parametric perfusion maps were calculated using RAPID software. CONTRAST:  171m OMNIPAQUE IOHEXOL 350 MG/ML SOLN COMPARISON:  None. FINDINGS: CTA NECK FINDINGS Aortic arch: Great vessel origins are patent. Right carotid system: Patent. No measurable stenosis at the ICA origin. Left carotid system: Patent. Minimal calcified plaque at the ICA origin without measurable stenosis. Vertebral arteries: Patent and codominant. Skeleton: Degenerative changes of the cervical spine Other neck: No mass or  adenopathy. Upper chest: No apical lung mass. Review of the MIP images confirms the above findings CTA HEAD FINDINGS Anterior circulation: Intracranial internal carotid arteries patent. Anterior and middle cerebral arteries are patent. Posterior circulation: Intracranial vertebral arteries, basilar artery and posterior cerebral arteries are patent. Venous sinuses: As permitted by contrast timing, patent. Review of the MIP images confirms the above findings CT Brain Perfusion Findings: CBF (<30%) Volume: 022mPerfusion (Tmax>6.0s) volume: 58m35mismatch Volume: 58mL34mfarction Location: None. IMPRESSION: No large vessel occlusion or hemodynamically significant stenosis. Perfusion imaging demonstrates no evidence of core infarction or territory at risk. These results were called by telephone at the time of interpretation on 01/14/2020 at 11:27 am to provider Dr. DeloStark Jocko verbally acknowledged these results. Electronically Signed   By: PranMacy Mis.   On: 01/14/2020 11:27   VAS US TKoreaNSCRANIAL DOPPLER W BUBBLES  Result Date: 01/17/2020  Transcranial Doppler with Bubble Indications: Stroke. Performing Technologist: MegaAbram Sander  Examination Guidelines: A complete evaluation includes B-mode imaging, spectral Doppler, color Doppler, and power Doppler as needed of all accessible portions of each vessel. Bilateral testing is considered an integral part of a complete examination. Limited examinations for reoccurring indications may be performed as noted.  Summary: No HITS at rest or during Valsalva. Negative transcranial Doppler Bubble study with no evidence of right to left intracardiac communication.  A vascular evaluation was performed. The right middle cerebral  artery was studied. An IV was inserted into the patient's left forearm. Verbal informed consent was obtained.  Negative TCD Bubble study showing no significant right to left shunt *See table(s) above for TCD measurements and observations.  Diagnosing  physician: Antony Contras MD Electronically signed by Antony Contras MD on 01/17/2020 at 12:53:47 PM.    Final    ECHOCARDIOGRAM COMPLETE  Result Date: 01/15/2020    ECHOCARDIOGRAM REPORT   Patient Name:   John Watson Nikolov Date of Exam: 01/15/2020 Medical Rec #:  [id number]        Height:       71.0 in Accession #:    [id number]       Weight:       198.1 lb Date of Birth:  June 05, 1960        BSA:          2.100 m Patient Age:    75 years         BP:           123/75 mmHg Patient Gender: M                HR:           74 bpm. Exam Location:  Inpatient Procedure: 2D Echo, Color Doppler and Cardiac Doppler Indications:    Stroke i163.9  History:        Patient has no prior history of Echocardiogram examinations.  Sonographer:    Raquel Sarna Senior RDCS Referring Phys: [place]  1. Left ventricular ejection fraction, by estimation, is 65 to 70%. The left ventricle has normal function. The left ventricle has no regional wall motion abnormalities. There is moderate asymmetric left ventricular hypertrophy of the basal-septal segment. Left ventricular diastolic parameters were normal.  2. Right ventricular systolic function is normal. The right ventricular size is normal. Tricuspid regurgitation signal is inadequate for assessing PA pressure.  3. The mitral valve is normal in structure. No evidence of mitral valve regurgitation.  4. The aortic valve is tricuspid. Thickened leaflets. Aortic valve regurgitation is not visualized. Mild aortic valve sclerosis is present, with no evidence of aortic valve stenosis.  5. The interatrial septum was not well visualized Conclusion(s)/Recommendation(s): No clear source of embolism seen. Consider TEE if clinically indicated. FINDINGS  Left Ventricle: Left ventricular ejection fraction, by estimation, is 65 to 70%. The left ventricle has normal function. The left ventricle has no regional wall motion abnormalities. The left ventricular internal cavity size was normal in size.  There is  moderate asymmetric left ventricular hypertrophy of the basal-septal segment. Left ventricular diastolic parameters were normal. Right Ventricle: The right ventricular size is normal. Right vetricular wall thickness was not assessed. Right ventricular systolic function is normal. Tricuspid regurgitation signal is inadequate for assessing PA pressure. Left Atrium: Left atrial size was normal in size. Right Atrium: Right atrial size was normal in size. Pericardium: There is no evidence of pericardial effusion. Mitral Valve: The mitral valve is normal in structure. No evidence of mitral valve regurgitation. Tricuspid Valve: The tricuspid valve is normal in structure. Tricuspid valve regurgitation is trivial. Aortic Valve: The aortic valve is tricuspid. Aortic valve regurgitation is not visualized. Mild aortic valve sclerosis is present, with no evidence of aortic valve stenosis. Pulmonic Valve: The pulmonic valve was grossly normal. Pulmonic valve regurgitation is not visualized. Aorta: The aortic root and ascending aorta are structurally normal, with no evidence of dilitation. IAS/Shunts: The interatrial septum was not well visualized.  LEFT  VENTRICLE PLAX 2D LVIDd:         4.10 cm  Diastology LVIDs:         2.40 cm  LV e' lateral:   8.92 cm/s LV PW:         1.20 cm  LV E/e' lateral: 10.6 LV IVS:        1.20 cm  LV e' medial:    8.49 cm/s LVOT diam:     2.20 cm  LV E/e' medial:  11.1 LV SV:         79 LV SV Index:   38 LVOT Area:     3.80 cm  RIGHT VENTRICLE RV S prime:     15.70 cm/s TAPSE (M-mode): 2.4 cm LEFT ATRIUM             Index       RIGHT ATRIUM           Index LA diam:        3.40 cm 1.62 cm/m  RA Area:     13.60 cm LA Vol (A2C):   49.5 ml 23.57 ml/m RA Volume:   30.60 ml  14.57 ml/m LA Vol (A4C):   27.2 ml 12.95 ml/m LA Biplane Vol: 37.9 ml 18.05 ml/m  AORTIC VALVE LVOT Vmax:   104.00 cm/s LVOT Vmean:  71.600 cm/s LVOT VTI:    0.209 m  AORTA Ao Root diam: 3.40 cm Ao Asc diam:  3.50 cm  MITRAL VALVE MV Area (PHT): 4.15 cm    SHUNTS MV Decel Time: 183 msec    Systemic VTI:  0.21 m MV E velocity: 94.30 cm/s  Systemic Diam: 2.20 cm MV A velocity: 60.80 cm/s MV E/A ratio:  1.55 [name] MD Electronically signed by [name] MD Signature Date/Time: 01/15/2020/11:05:03 AM    Final    ECHO TEE  Result Date: 01/17/2020    TRANSESOPHOGEAL ECHO REPORT   Patient Name:   John Watson Date of Exam: 01/17/2020 Medical Rec #:  [id number]        Height:       71.0 in Accession #:    [id number]       Weight:       198.1 lb Date of Birth:  03-22-1961        BSA:          2.100 m Patient Age:    3 years         BP:           160/85 mmHg Patient Gender: M                HR:           87 bpm. Exam Location:  Inpatient Procedure: Transesophageal Echo, 3D Echo and Saline Contrast Bubble Study Indications:     stroke 434.91  History:         Patient has prior history of Echocardiogram examinations, most                  recent 01/15/2020. Risk Factors:Former Smoker and Dyslipidemia.  Sonographer:     Johny Chess Referring Phys:  [id number] [place] Diagnosing Phys: [name] MD PROCEDURE: After discussion of the risks and benefits of a TEE, an informed consent was obtained from the patient. The transesophogeal probe was passed without difficulty through the esophogus of the patient. Sedation performed by different physician. The patient was monitored while under deep sedation. Anesthestetic sedation was provided intravenously by  Anesthesiology: 331m of Propofol, 445mof Lidocaine. The patient developed no complications during the procedure. IMPRESSIONS  1. Left ventricular ejection fraction, by estimation, is 60 to 65%. The left ventricle has normal function. The left ventricle has no regional wall motion abnormalities. There is moderate left ventricular hypertrophy.  2. Right ventricular systolic function is normal. The right ventricular size is mildly enlarged.  3. No  left atrial/left atrial appendage thrombus was detected.  4. The mitral valve is normal in structure. Trivial mitral valve regurgitation.  5. The aortic valve is tricuspid. Aortic valve regurgitation is not visualized.  6. Agitated saline contrast bubble study was minimally positive, which could be consistent with very small PFO. FINDINGS  Left Ventricle: Left ventricular ejection fraction, by estimation, is 60 to 65%. The left ventricle has normal function. The left ventricle has no regional wall motion abnormalities. The left ventricular internal cavity size was normal in size. There is  moderate left ventricular hypertrophy. Right Ventricle: The right ventricular size is mildly enlarged. Right vetricular wall thickness was not assessed. Right ventricular systolic function is normal. Left Atrium: Left atrial size was normal in size. No left atrial/left atrial appendage thrombus was detected. Right Atrium: Right atrial size was normal in size. Pericardium: There is no evidence of pericardial effusion. Mitral Valve: The mitral valve is normal in structure. Trivial mitral valve regurgitation. Tricuspid Valve: The tricuspid valve is normal in structure. Tricuspid valve regurgitation is trivial. Aortic Valve: The aortic valve is tricuspid. Aortic valve regurgitation is not visualized. Pulmonic Valve: The pulmonic valve was grossly normal. Pulmonic valve regurgitation is not visualized. Aorta: The aortic root is normal in size and structure. IAS/Shunts: No atrial level shunt detected by color flow Doppler. Agitated saline contrast was given intravenously to evaluate for intracardiac shunting. Agitated saline contrast bubble study was positive with shunting observed within 3-6 cardiac cycles suggestive of interatrial shunt. ChOswaldo MilianD Electronically signed by ChOswaldo MilianD Signature Date/Time: 01/17/2020/3:28:46 PM    Final    CT HEAD CODE STROKE WO CONTRAST  Result Date: 01/14/2020 CLINICAL  DATA:  Code stroke. EXAM: CT HEAD WITHOUT CONTRAST TECHNIQUE: Contiguous axial images were obtained from the base of the skull through the vertex without intravenous contrast. COMPARISON:  None. FINDINGS: Brain: No acute intracranial hemorrhage, mass effect, or edema. Gray-white differentiation is preserved. Minimal patchy hypoattenuation in the supratentorial white matter, for example in the right parietal subcortical region. Vascular: No hyperdense vessel or unexpected calcification. Skull: Unremarkable. Sinuses/Orbits: No acute finding. Other: Mastoid air cells are clear. ASPECTS ([place]roke Program Early CT Score) - Ganglionic level infarction (caudate, lentiform nuclei, internal capsule, insula, M1-M3 cortex): 7 - Supraganglionic infarction (M4-M6 cortex): 3 Total score (0-10 with 10 being normal): 10 IMPRESSION: No acute intracranial hemorrhage or evidence of acute infarction. ASPECT score is 10. Minimal patchy hypoattenuation in the supratentorial white matter may reflect nonspecific gliosis/demyelination. Focus in the right parietal subcortical white matter could also reflect an area of recent infarction. These results were called by telephone at the time of interpretation on 01/14/2020 at 8:48 am to provider DOVeryl Speak who verbally acknowledged these results. Electronically Signed   By: PrMacy Mis.D.   On: 01/14/2020 08:53   CT ANGIO HEAD CODE STROKE  Result Date: 01/14/2020 CLINICAL DATA:  Code stroke follow-up, left-sided weakness EXAM: CT ANGIOGRAPHY HEAD AND NECK CT PERFUSION BRAIN TECHNIQUE: Multidetector CT imaging of the head and neck was performed using the standard protocol during bolus administration of intravenous contrast.  Multiplanar CT image reconstructions and MIPs were obtained to evaluate the vascular anatomy. Carotid stenosis measurements (when applicable) are obtained utilizing NASCET criteria, using the distal internal carotid diameter as the denominator. Multiphase CT  imaging of the brain was performed following IV bolus contrast injection. Subsequent parametric perfusion maps were calculated using RAPID software. CONTRAST:  184m OMNIPAQUE IOHEXOL 350 MG/ML SOLN COMPARISON:  None. FINDINGS: CTA NECK FINDINGS Aortic arch: Great vessel origins are patent. Right carotid system: Patent. No measurable stenosis at the ICA origin. Left carotid system: Patent. Minimal calcified plaque at the ICA origin without measurable stenosis. Vertebral arteries: Patent and codominant. Skeleton: Degenerative changes of the cervical spine Other neck: No mass or adenopathy. Upper chest: No apical lung mass. Review of the MIP images confirms the above findings CTA HEAD FINDINGS Anterior circulation: Intracranial internal carotid arteries patent. Anterior and middle cerebral arteries are patent. Posterior circulation: Intracranial vertebral arteries, basilar artery and posterior cerebral arteries are patent. Venous sinuses: As permitted by contrast timing, patent. Review of the MIP images confirms the above findings CT Brain Perfusion Findings: CBF (<30%) Volume: 023mPerfusion (Tmax>6.0s) volume: 72m372mismatch Volume: 72mL92mfarction Location: None. IMPRESSION: No large vessel occlusion or hemodynamically significant stenosis. Perfusion imaging demonstrates no evidence of core infarction or territory at risk. These results were called by telephone at the time of interpretation on 01/14/2020 at 11:27 am to provider Dr. DeloStark Jocko verbally acknowledged these results. Electronically Signed   By: PranMacy Mis.   On: 01/14/2020 11:27   CT ANGIO NECK CODE STROKE  Result Date: 01/14/2020 CLINICAL DATA:  Code stroke follow-up, left-sided weakness EXAM: CT ANGIOGRAPHY HEAD AND NECK CT PERFUSION BRAIN TECHNIQUE: Multidetector CT imaging of the head and neck was performed using the standard protocol during bolus administration of intravenous contrast. Multiplanar CT image reconstructions and MIPs were  obtained to evaluate the vascular anatomy. Carotid stenosis measurements (when applicable) are obtained utilizing NASCET criteria, using the distal internal carotid diameter as the denominator. Multiphase CT imaging of the brain was performed following IV bolus contrast injection. Subsequent parametric perfusion maps were calculated using RAPID software. CONTRAST:  1072mL94mIPAQUE IOHEXOL 350 MG/ML SOLN COMPARISON:  None. FINDINGS: CTA NECK FINDINGS Aortic arch: Great vessel origins are patent. Right carotid system: Patent. No measurable stenosis at the ICA origin. Left carotid system: Patent. Minimal calcified plaque at the ICA origin without measurable stenosis. Vertebral arteries: Patent and codominant. Skeleton: Degenerative changes of the cervical spine Other neck: No mass or adenopathy. Upper chest: No apical lung mass. Review of the MIP images confirms the above findings CTA HEAD FINDINGS Anterior circulation: Intracranial internal carotid arteries patent. Anterior and middle cerebral arteries are patent. Posterior circulation: Intracranial vertebral arteries, basilar artery and posterior cerebral arteries are patent. Venous sinuses: As permitted by contrast timing, patent. Review of the MIP images confirms the above findings CT Brain Perfusion Findings: CBF (<30%) Volume: 72mL P106musion (Tmax>6.0s) volume: 72mL Mi24mtch Volume: 72mL Inf71mtion Location: None. IMPRESSION: No large vessel occlusion or hemodynamically significant stenosis. Perfusion imaging demonstrates no evidence of core infarction or territory at risk. These results were called by telephone at the time of interpretation on 01/14/2020 at 11:27 am to provider Dr. Delo, WhStark Jockrbally acknowledged these results. Electronically Signed   By: Praneil Macy MisOn: 01/14/2020 11:27      HISTORY OF PRESENT ILLNESS John Watson John [age] y.o. 9le with no past medical history.  Patient states that he got up to go  to the bathroom just prior to 630  01/14/2020 (LKW).  After finishing patient went to turn around and noticed that his left leg felt significantly weak and numb.  He was having a hard time walking with his leg.  About 30 minutes after he noticed that his left arm felt extremely heavy and had decreased sensation.  This only lasted for 45 seconds to 1 minute.  However his leg did not improve as far as sensation and it felt like dead weight.  Patient went to [facility] ER.  At that point teleneurology had seen the patient and consent was given to give TPA.  Patient was then transferred to [facility].  On arrival patient states he still does have some decrease sensation subjectively, and doing fine movements with the left leg are difficult however the sensation has improved. Premorbid modified Rankin scale (mRS): 0. NIHSS 0.   While talking to the patient he did mention that his left leg and left arm have been twitching intermittently and it is rhythmic.  At times it would be both leg and arm and at times will be either the leg and or the arm.  He states that this is not normal for him and started today.  He is completely alert and awake when it occurs but does not have control over it.    HOSPITAL COURSE John Watson is a 59 y.o. male with no past medical history presenting to [facility] with transient L sided weakness and decreased sensation w/ recurrent L sided weakness when L leg weakness remains. Treated w/ tPA 01/14/2020 at 0957 and transferred to [place]. [facility].    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 ASPECTS 10. R supratentorial white matter patchy attenuation ? Etiology. Focus R parietal subcortical white matter ? Recent infarct  CTA head & neck Unremarkable  CT perfusion no core  MRI w/o  R medial frontal lobe cortical edema w/ restricted  diffusion. R parietal lobe 46m cortical lesion w/ mild mass effect concerning for glioma. No acute infarct.  MRI w/ contrast enhancement Contrast R frontal lesion. No contrast R parietal lesion. Infarct vs low-grade glioma. F/u contrast in 4-6 weeks 2D Echo EF 65-70%. No source of embolus  TEE Minimally positive bubble study, could be consistent with very small PFO  LE doppler not completed prior to d/c  Loop placed 8/19 to look for AF as possible source of stroke TCD bubble neg PFO LDL 153 HgbA1c 4.8 Given his young age, checked hypercoagulable tests (lupus anticoagulant, cardiolipin antibody) and vasculitic labs (C3, C4, CH50, ANA, ESR) as well as HIV and RPR as possible stroke sources. No antithrombotic prior to admission, add aspirin 81 and plavix 75 mg daily. Continue DAPT x 3 weeks then aspirin alone.   Therapy recommendations:  No therapy needs, no SLP Disposition:  Return home  Neurosurgery consult ([name]) - leaning toward embolic infarcts - CT abd neg for malignancy - recommend follow up MRI in 4-6 weeks Arrange outpatient LE dopplers   Seizure Intermittent L leg and arm rhythmic twitching EEG intermittent slowing R frontotemporal region Loaded w/ keppra followed by 750 bid  Continue keppra at d/c   Hyperlipidemia Home meds:  No statin LDL 153, goal < 70 Add Lipitor 80 Continue statin at discharge   Other Stroke Risk Factors Former  Cigarette smoker ETOH use, alcohol level <10, advised to drink no more than 2 drink(s) a day Possible small PFO   Other Active Problems AKI resolved 1.25-1.3-1.07   DISCHARGE EXAM Blood pressure (!) 165/81, pulse 90, temperature 98.3 F (36.8 C), temperature source Axillary, resp. rate 18, height '5\' 11"'  (1.803 m), weight 89.9 kg, SpO2 98 %. Pleasant middle-age African-American male not in distress. . Afebrile. Head is nontraumatic. Neck is supple without bruit.    Cardiac exam no murmur or gallop. Lungs are clear to auscultation. Distal  pulses are well felt. Neurological Exam ;  Awake  Alert oriented x 3. Normal speech and language.eye movements full without nystagmus.fundi were not visualized. Vision acuity and fields appear normal. Hearing is normal. Palatal movements are normal. Face symmetric. Tongue midline. Normal strength, tone, reflexes and coordination except trace left hip flexor weakness on double simultaneous testing.. Normal sensation. Gait deferred.  Discharge Diet   Regular thin liquids  DISCHARGE PLAN Disposition:  Return home  follow up MRI in 4-6 weeks to evaluate for possible glioma Continue Keppra at d/c aspirin 81 mg daily and clopidogrel 75 mg daily for secondary stroke prevention for 3 weeks then aspirin alone. Ongoing stroke risk factor control by Primary Care Physician at time of discharge Follow-up PCP Patient, No Pcp Per in 2 weeks. Follow-up in [facility] in 4 weeks, office to schedule an appointment.   32 minutes were spent preparing discharge.  Burnetta Sabin, MSN, APRN, ANVP-BC, AGPCNP-BC Advanced Practice Stroke Nurse [place] for Schedule & Pager information 01/17/2020 3:42 PM   I have personally obtained history,examined this patient, reviewed notes, independently viewed imaging studies, participated in medical decision making and plan of care.ROS completed by me personally and pertinent positives fully documented  I have made any additions or clarifications directly to the above note. Agree with note above.   Antony Contras, MD Medical Director [facility] Pager: [phone] 01/18/2020 1:35 PM

## 2020-01-15 NOTE — Progress Notes (Addendum)
STROKE TEAM PROGRESS NOTE   INTERVAL HISTORY I personally reviewed history of present illness with the patient, electronic medical records and imaging films in PACS.  John presented with left leg numbness and weakness followed by transient left hand weakness and numbness and MRI scan shows enhancing right parietal parasagittal frontal lesion as well as nonenhancing posterior right parietal wedge-shaped lesion possibly subacute infarcts though low-grade glioma is also possible.  Patient received IV TPA and seems to be doing better and left leg weakness and numbness are improving though not back to baseline.  Blood pressure adequately controlled.  Vital signs stable.  John denies any prior history of strokes TIAs significant neurological problems.  His LDL cholesterol is elevated 155 mg percent hemoglobin A1c is 4.8 John also had focal left-sided seizures and is started on Keppra and has had no further breakthrough seizures.  EEG shows right frontotemporal slowing but no epileptiform activity  Vitals:   01/15/20 0600 01/15/20 0700 01/15/20 0800 01/15/20 0900  BP: 127/87 117/78 115/79 123/75  Pulse: 88 91 79 76  Resp:   (!) 23 16  Temp: 98.1 F (36.7 C) 98.3 F (36.8 C) 98.3 F (36.8 C) 98.3 F (36.8 C)  TempSrc: Oral Oral Oral Oral  SpO2:   97% 99%  Weight:      Height:       CBC:  Recent Labs  Lab 01/14/20 0835 01/14/20 0937  WBC 5.0  --   NEUTROABS 3.3  --   HGB 13.7 13.9  HCT 42.3 41.0  MCV 94.6  --   PLT 270  --    Basic Metabolic Panel:  Recent Labs  Lab 01/14/20 0835 01/14/20 0835 01/14/20 0937 01/15/20 0158  NA 139   < > 139 140  K 4.2   < > 6.8* 3.5  CL 104   < > 104 107  CO2 25  --   --  25  GLUCOSE 135*   < > 126* 99  BUN 15   < > 19 10  CREATININE 1.25*   < > 1.30* 1.07  CALCIUM 9.5  --   --  9.4   < > = values in this interval not displayed.   Lipid Panel:  Recent Labs  Lab 01/15/20 0158  CHOL 221*  TRIG 118  HDL 44  CHOLHDL 5.0  VLDL 24  LDLCALC 153*    HgbA1c:  Recent Labs  Lab 01/15/20 0158  HGBA1C 4.8   Urine Drug Screen:  Recent Labs  Lab 01/14/20 1040  LABOPIA NONE DETECTED  COCAINSCRNUR NONE DETECTED  LABBENZ NONE DETECTED  AMPHETMU NONE DETECTED  THCU NONE DETECTED  LABBARB NONE DETECTED    Alcohol Level  Recent Labs  Lab 01/14/20 0835  ETH <10    IMAGING past 24 hours EEG  Result Date: 01/14/2020 John Watson     01/14/2020  3:43 PM Patient Name: John Watson MRN: [id number] Epilepsy Attending: Lora Watson Referring Physician/Provider: Etta Quill, Watson Date: 01/14/2020 Duration: 22.03 minutes Patient history: 59 year old male with sudden onset left upper and lower extremity weakness.  EEG to evaluate for seizures. Level of alertness: Awake AEDs during EEG study: Keppra Technical aspects: This EEG study was done with scalp electrodes positioned according to the 10-20 International system of electrode placement. Electrical activity was acquired at a sampling rate of '500Hz'  and reviewed with a high frequency filter of '70Hz'  and a low frequency filter of '1Hz' . EEG data were recorded continuously and digitally stored. Description:  The posterior dominant rhythm consists of 9.5 Hz activity of moderate voltage (25-35 uV) seen predominantly in posterior head regions, symmetric and reactive to eye opening and eye closing. EEG showed intermittent 3 to 6 Hz theta-delta slowing in the right frontotemporal region.  Hyperventilation and photic stimulation were not performed.   ABNORMALITY -Intermittent slow, right frontotemporal region IMPRESSION: This study is suggestive of cortical dysfunction in right frontotemporal region, nonspecific etiology.  No seizures or epileptiform discharges were seen throughout the recording. John Watson   CT HEAD WO CONTRAST  Result Date: 01/15/2020 CLINICAL DATA:  Stroke follow-up EXAM: CT HEAD WITHOUT CONTRAST TECHNIQUE: Contiguous axial images were obtained from the base of the skull  through the vertex without intravenous contrast. COMPARISON:  Head CT from yesterday FINDINGS: Brain: The parasagittal right posterior frontal lesion is mildly dense by CT. The lateral right parietal lesion is low-density. No hematoma, hydrocephalus, shift, or interval finding. Small remote left cerebellar infarction. Vascular: No hyperdense vessel or unexpected calcification. Skull: Negative Sinuses/Orbits: Negative IMPRESSION: Stable low-density right parietal and high-density right frontal lesions, see preceding brain MRI. Electronically Signed   By: Monte Fantasia M.D.   On: 01/15/2020 05:44   MR BRAIN WO CONTRAST  Result Date: 01/14/2020 CLINICAL DATA:  Follow-up stroke EXAM: MRI HEAD WITHOUT CONTRAST TECHNIQUE: Multiplanar, multiecho pulse sequences of the brain and surrounding structures were obtained without intravenous contrast. COMPARISON:  CT head 01/14/2020 FINDINGS: Brain: Cortical edema in the right medial frontal lobe posteriorly with facilitated diffusion. Cortical edema in the right parietal cortex without abnormality on diffusion. This is higher signal on T2 than the lesion in the right frontal lobe and appears to have some masslike characteristics. No abnormality on diffusion-weighted imaging. Possible cortical tumor. This area measures approximately 23 mm in diameter. Few small scattered hyperintensities in the white matter bilaterally. Negative for hemorrhage or mass. Vascular: Normal arterial flow voids. Skull and upper cervical spine: No focal skeletal lesion. Sinuses/Orbits: Mild mucosal edema paranasal sinuses. Negative orbit Other: None IMPRESSION: Cortical edema with restricted diffusion in the right medial frontal lobe. Differential diagnosis includes subacute infarct, seizure related activity, and less likely tumor. 23 mm cortically based lesion in the right parietal lobe with mild mass-effect. 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 Negative for acute infarct Postcontrast imaging  may be helpful. Follow-up imaging may also be helpful to evaluate for evolutionary changes of ischemia. These results were called by telephone at the time of interpretation on 01/14/2020 at 4:24 pm to provider Leonel Ramsay, who verbally acknowledged these results. Electronically Signed   By: Franchot Gallo M.D.   On: 01/14/2020 16:25   MR BRAIN W CONTRAST  Result Date: 01/14/2020 CLINICAL DATA:  Brain mass EXAM: MRI HEAD WITH CONTRAST TECHNIQUE: Multiplanar, multiecho pulse sequences of the brain and surrounding structures were obtained with intravenous contrast. CONTRAST:  99m GADAVIST GADOBUTROL 1 MMOL/ML IV SOLN COMPARISON:  Brain MRI without contrast 01/14/2020 FINDINGS: In the paramedian superior right frontal lobe, there is an area of poorly circumscribed contrast enhancement in the region of the FLAIR abnormality shown on the earlier study. The area of enhancement measures approximately 16 x 9 mm. There is no contrast enhancement at the site of the posterior right parietal lobe lesion. IMPRESSION: Area of poorly circumscribed contrast enhancement associated with the right frontal lesion, but not with the right parietal lesion. 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 Follow-up MRI with and without contrast in 4-6 weeks might be helpful for differentiation. Electronically Signed  By: Ulyses Jarred M.D.   On: 01/14/2020 21:52   ECHOCARDIOGRAM COMPLETE  Result Date: 01/15/2020    ECHOCARDIOGRAM REPORT   Patient Name:   SHIVAN HODES Hazelbaker Date of Exam: 01/15/2020 Medical Rec #:  [id number]        Height:       71.0 in Accession #:    [id number]       Weight:       198.1 lb Date of Birth:  [birth year]        BSA:          2.100 m Patient Age:    69 years         BP:           123/75 mmHg Patient Gender: M                HR:           74 bpm. Exam Location:  Inpatient Procedure: 2D Echo, Color Doppler and Cardiac Doppler Indications:    Stroke i163.9  History:        Patient has  no prior history of Echocardiogram examinations.  Sonographer:    Raquel Sarna Senior RDCS Referring Phys: Porter  1. Left ventricular ejection fraction, by estimation, is 65 to 70%. The left ventricle has normal function. The left ventricle has no regional wall motion abnormalities. There is moderate asymmetric left ventricular hypertrophy of the basal-septal segment. Left ventricular diastolic parameters were normal.  2. Right ventricular systolic function is normal. The right ventricular size is normal. Tricuspid regurgitation signal is inadequate for assessing Watson pressure.  3. The mitral valve is normal in structure. No evidence of mitral valve regurgitation.  4. The aortic valve is tricuspid. Thickened leaflets. Aortic valve regurgitation is not visualized. Mild aortic valve sclerosis is present, with no evidence of aortic valve stenosis.  5. The interatrial septum was not well visualized Conclusion(s)/Recommendation(s): No clear source of embolism seen. Consider TEE if clinically indicated. FINDINGS  Left Ventricle: Left ventricular ejection fraction, by estimation, is 65 to 70%. The left ventricle has normal function. The left ventricle has no regional wall motion abnormalities. The left ventricular internal cavity size was normal in size. There is  moderate asymmetric left ventricular hypertrophy of the basal-septal segment. Left ventricular diastolic parameters were normal. Right Ventricle: The right ventricular size is normal. Right vetricular wall thickness was not assessed. Right ventricular systolic function is normal. Tricuspid regurgitation signal is inadequate for assessing Watson pressure. Left Atrium: Left atrial size was normal in size. Right Atrium: Right atrial size was normal in size. Pericardium: There is no evidence of pericardial effusion. Mitral Valve: The mitral valve is normal in structure. No evidence of mitral valve regurgitation. Tricuspid Valve: The tricuspid valve is  normal in structure. Tricuspid valve regurgitation is trivial. Aortic Valve: The aortic valve is tricuspid. Aortic valve regurgitation is not visualized. Mild aortic valve sclerosis is present, with no evidence of aortic valve stenosis. Pulmonic Valve: The pulmonic valve was grossly normal. Pulmonic valve regurgitation is not visualized. Aorta: The aortic root and ascending aorta are structurally normal, with no evidence of dilitation. IAS/Shunts: The interatrial septum was not well visualized.  LEFT VENTRICLE PLAX 2D LVIDd:         4.10 cm  Diastology LVIDs:         2.40 cm  LV e' lateral:   8.92 cm/s LV PW:         1.20 cm  LV  E/e' lateral: 10.6 LV IVS:        1.20 cm  LV e' medial:    8.49 cm/s LVOT diam:     2.20 cm  LV E/e' medial:  11.1 LV SV:         79 LV SV Index:   38 LVOT Area:     3.80 cm  RIGHT VENTRICLE RV S prime:     15.70 cm/s TAPSE (M-mode): 2.4 cm LEFT ATRIUM             Index       RIGHT ATRIUM           Index LA diam:        3.40 cm 1.62 cm/m  RA Area:     13.60 cm LA Vol (A2C):   49.5 ml 23.57 ml/m RA Volume:   30.60 ml  14.57 ml/m LA Vol (A4C):   27.2 ml 12.95 ml/m LA Biplane Vol: 37.9 ml 18.05 ml/m  AORTIC VALVE LVOT Vmax:   104.00 cm/s LVOT Vmean:  71.600 cm/s LVOT VTI:    0.209 m  AORTA Ao Root diam: 3.40 cm Ao Asc diam:  3.50 cm MITRAL VALVE MV Area (PHT): 4.15 cm    SHUNTS MV Decel Time: 183 msec    Systemic VTI:  0.21 m MV E velocity: 94.30 cm/s  Systemic Diam: 2.20 cm MV A velocity: 60.80 cm/s MV E/A ratio:  1.55 [name] Watson Electronically signed by [name] Watson Signature Date/Time: 01/15/2020/11:05:03 AM    Final     PHYSICAL EXAM Pleasant middle-age African-American male not in distress. . Afebrile. Head is nontraumatic. Neck is supple without bruit.    Cardiac exam no murmur or gallop. Lungs are clear to auscultation. Distal pulses are well felt. Neurological Exam ;  Awake  Alert oriented x 3. Normal speech and language.eye movements full without  nystagmus.fundi were not visualized. Vision acuity and fields appear normal. Hearing is normal. Palatal movements are normal. Face symmetric. Tongue midline. Normal strength, tone, reflexes and coordination except trace left hip flexor weakness on double simultaneous testing.. Normal sensation. Gait deferred.  ASSESSMENT/PLAN Mr. OKIE BOGACZ is a 59 y.o. male with no past medical history presenting to [facility] with transient L sided weakness and decreased sensation w/ recurrent L sided weakness when L leg weakness remains. Treated w/ tPA 01/14/2020 at 0957 and transferred to [place].   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 ASPECTS 10. R supratentorial white matter patchy attenuation ? Etiology. Focus R parietal subcortical white matter ? Recent infarct   CTA head & neck Unremarkable   CT perfusion no core   MRI w/o  R medial frontal lobe cortical edema w/ restricted diffusion. R parietal lobe 32m cortical lesion w/ mild mass effect concerning for glioma. No acute infarct.   MRI w/ contrast enhancement Contrast R frontal lesion. No contrast R parietal lesion. Infarct vs low-grade glioma. F/u contrast in 4-6 weeks  2D Echo EF 65-70%. No source of embolus   TEE and Loop  - may do as an OP  pending   TCD bubble pending   LDL 153  HgbA1c 4.8  Given his young age, will check hypercoagulable tests (lupus anticoagulant, cardiolipin antibody) and vasculitic labs (C3, C4, CH50, ANA, ESR) as well as HIV and RPR as possible stroke sources.  VTE prophylaxis - SCDs  No antithrombotic prior to admission, add aspirin 81 mg daily   Therapy recommendations:  pending    Disposition:  Return home  Neurosurgery consult ([name]) - leaning toward embolic infarcts - recommend follow up MRI in 4-6 weeks  Seizure  Intermittent L leg and arm  rhythmic twitching  EEG intermittent slowing R frontotemporal region  Loaded w/ keppra followed by 750 bid   Hyperlipidemia  Home meds:  No statin  LDL 153, goal < 70  Add Lipitor 80  Continue statin at discharge  Other Stroke Risk Factors  Former Cigarette smoker  ETOH use, alcohol level <10, advised to drink no more than 2 drink(s) a day  Other Active Problems  AKI resolved 1.25-1.3-1.07  Hospital day # 1  John presented with a strokelike episode with sudden onset of left leg weakness and numbness but MRI shows enhancing right frontal parasagittal lesion possibly tumor rather than a subacute infarct but however nonenhancing right posterior parietal wedge-shaped lesion is most suspicious for chronic infarct.  Recommend continue ongoing stroke work-up check echocardiogram, transcranial Doppler bubble study for PFO, lab work for vasculitis and hypercoagulability.  TEE for cardiac source of embolism.  Consult neurosurgery for possible biopsy of lesion electively after recovery.  Mobilize out of bed.  Therapy consults.  Continue close neurological monitoring and strict blood pressure control as per post TPA protocol.  Continue Keppra for seizures and the current dose of 750 mg twice daily long discussion patient and answered questions.  Discussed with Dr. Reatha Armour neurosurgery This patient is critically ill and at significant risk of neurological worsening, death and care requires constant monitoring of vital signs, hemodynamics,respiratory and cardiac monitoring, extensive review of multiple databases, frequent neurological assessment, discussion with family, other specialists and medical decision making of high complexity.I have made any additions or clarifications directly to the above note.This critical care time does not reflect procedure time, or teaching time or supervisory time of Watson/NP/Med Resident etc but could involve care discussion time.  I spent 30 minutes of neurocritical care  time  in the care of  this patient.    Antony Contras, Watson  To contact Stroke Continuity provider, please refer to [email or web address] After hours, contact General Neurology

## 2020-01-15 NOTE — Progress Notes (Signed)
Echocardiogram 2D Echocardiogram has been performed.  Oneal Deputy Dylin Breeden 01/15/2020, 8:58 AM

## 2020-01-16 ENCOUNTER — Inpatient Hospital Stay (HOSPITAL_COMMUNITY): Payer: Self-pay

## 2020-01-16 DIAGNOSIS — F17200 Nicotine dependence, unspecified, uncomplicated: Secondary | ICD-10-CM | POA: Diagnosis present

## 2020-01-16 DIAGNOSIS — I639 Cerebral infarction, unspecified: Secondary | ICD-10-CM

## 2020-01-16 DIAGNOSIS — E785 Hyperlipidemia, unspecified: Secondary | ICD-10-CM | POA: Diagnosis present

## 2020-01-16 DIAGNOSIS — G40909 Epilepsy, unspecified, not intractable, without status epilepticus: Secondary | ICD-10-CM

## 2020-01-16 DIAGNOSIS — R569 Unspecified convulsions: Secondary | ICD-10-CM

## 2020-01-16 LAB — SEDIMENTATION RATE: Sed Rate: 5 mm/hr (ref 0–16)

## 2020-01-16 LAB — HIV ANTIBODY (ROUTINE TESTING W REFLEX): HIV Screen 4th Generation wRfx: NONREACTIVE

## 2020-01-16 LAB — SURGICAL PCR SCREEN
MRSA, PCR: NEGATIVE
Staphylococcus aureus: POSITIVE — AB

## 2020-01-16 MED ORDER — LEVETIRACETAM 750 MG PO TABS
750.0000 mg | ORAL_TABLET | Freq: Two times a day (BID) | ORAL | Status: DC
  Administered 2020-01-16 (×2): 750 mg via ORAL
  Filled 2020-01-16 (×2): qty 1

## 2020-01-16 MED ORDER — MUPIROCIN 2 % EX OINT
1.0000 "application " | TOPICAL_OINTMENT | Freq: Two times a day (BID) | CUTANEOUS | Status: DC
  Administered 2020-01-17: 1 via NASAL
  Filled 2020-01-16: qty 22

## 2020-01-16 MED ORDER — PANTOPRAZOLE SODIUM 40 MG PO TBEC
40.0000 mg | DELAYED_RELEASE_TABLET | Freq: Every day | ORAL | Status: DC
  Administered 2020-01-16: 40 mg via ORAL
  Filled 2020-01-16: qty 1

## 2020-01-16 MED ORDER — CLOPIDOGREL BISULFATE 75 MG PO TABS
75.0000 mg | ORAL_TABLET | Freq: Every day | ORAL | Status: DC
  Administered 2020-01-16: 75 mg via ORAL
  Filled 2020-01-16: qty 1

## 2020-01-16 MED ORDER — CHLORHEXIDINE GLUCONATE CLOTH 2 % EX PADS: 6.0000 | MEDICATED_PAD | Freq: Once | CUTANEOUS | Status: DC

## 2020-01-16 MED ORDER — CHLORHEXIDINE GLUCONATE CLOTH 2 % EX PADS: 6.0000 | MEDICATED_PAD | Freq: Every day | CUTANEOUS | Status: DC

## 2020-01-16 MED ORDER — CHLORHEXIDINE GLUCONATE CLOTH 2 % EX PADS
6.0000 | MEDICATED_PAD | Freq: Once | CUTANEOUS | Status: AC
  Administered 2020-01-16: 6 via TOPICAL

## 2020-01-16 MED ORDER — ASPIRIN EC 81 MG PO TBEC
81.0000 mg | DELAYED_RELEASE_TABLET | Freq: Every day | ORAL | Status: DC
  Administered 2020-01-16: 81 mg via ORAL
  Filled 2020-01-16: qty 1

## 2020-01-16 MED ORDER — SODIUM CHLORIDE 0.9 % IV SOLN: INTRAVENOUS | Status: DC

## 2020-01-16 NOTE — Evaluation (Signed)
Occupational Therapy Evaluation Patient Details Name: John Watson MRN: [id number] DOB: Feb 06, 1961 Today's Date: 01/16/2020    History of Present Illness 59 y.o. male with no past medical history presenting to [facility] with transient L sided weakness and decreased sensation w/ recurrent episodes. +twitching LUE and LLE with EEG showing slowing R frontotemporal region. Treated w/ tPA 01/14/2020 at 0957 and transferred to 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 Neurosurgery consulted and does not feel biopsy yet indicated. Recommending repeat MRI 4-6 weeks.    Clinical Impression   PTA patient independent and working. Admitted for above and limited by problem list below, including impaired dynamic balance and intermittent decreased coordination/sensation of L UE.  Patient completing bed mobility with supervision, transfers with min guard, and ADLs with min guard for safety.  He demonstrates 1 episode of L UE discoordination/tingling when donning mask, but otherwise reports L UE feels normal; 1 LOB when exiting bathroom staggering to L with min guard to correct. Patient unable to report pattern or fatigue level changing symptoms, but reports will try to determine today; encouraged him to use his L UE as much as possible.  Will follow acutely, but anticipate no further OT needs after dc home.     Follow Up Recommendations  No OT follow up;Supervision/Assistance - 24 hour (inital)    Equipment Recommendations  None recommended by OT    Recommendations for Other Services       Precautions / Restrictions Precautions Precautions: Fall Precaution Comments: +dizziness; not orthostatic Restrictions Weight Bearing Restrictions: No      Mobility Bed Mobility Overal bed mobility: Needs Assistance Bed Mobility: Supine to Sit     Supine to sit: Supervision     General bed mobility comments:  transiton to EOB with supervision   Transfers Overall transfer level: Needs assistance Equipment used: None Transfers: Sit to/from Stand Sit to Stand: Min guard         General transfer comment: for safety/balance    Balance Overall balance assessment: Needs assistance Sitting-balance support: No upper extremity supported;Feet supported Sitting balance-Leahy Scale: Fair Sitting balance - Comments: able manage socks with supervision    Standing balance support: No upper extremity supported;During functional activity Standing balance-Leahy Scale: Fair Standing balance comment: patient requires support for dynamic balance                           ADL either performed or assessed with clinical judgement   ADL Overall ADL's : Needs assistance/impaired Eating/Feeding: Modified independent Eating/Feeding Details (indicate cue type and reason): per patient report Grooming: Min guard;Standing   Upper Body Bathing: Set up;Sitting   Lower Body Bathing: Sit to/from stand;Minimal assistance   Upper Body Dressing : Set up;Sitting   Lower Body Dressing: Minimal assistance;Sit to/from stand Lower Body Dressing Details (indicate cue type and reason): for dynamic standing tasks  Toilet Transfer: Min guard;Ambulation           Functional mobility during ADLs: Min guard General ADL Comments: pt limited by impaired dynamic balance      Vision Patient Visual Report: No change from baseline Vision Assessment?: No apparent visual deficits     Perception     Praxis      Pertinent Vitals/Pain Pain Assessment: No/denies pain     Hand Dominance Right   Extremity/Trunk Assessment Upper Extremity Assessment Upper Extremity Assessment: LUE deficits/detail LUE Deficits /  Details: strength 5/5, intermittent tingling and decreased Arthur but inconsistent; able to use functionally  LUE Sensation:  (intermittent tingling, 1x resolved in 5 sec )   Lower Extremity  Assessment LLE Deficits / Details: strength 5/5; describes LLE feels heavy and feels a tensing and relaxing through his entire left side; reports numbness top/bottom of toes   Cervical / Trunk Assessment Cervical / Trunk Assessment: Normal   Communication Communication Communication: No difficulties   Cognition Arousal/Alertness: Awake/alert Behavior During Therapy: WFL for tasks assessed/performed Overall Cognitive Status: Within Functional Limits for tasks assessed                                     General Comments  pt has intermittent episode of decreased L hand coordination and tingling when donning mask, lasting for 5-10 seconds, then resolves. Encouraged him to use L hand as much as possible, determine if there is a pattern to symptoms or if fatigue increases symptoms     Exercises     Shoulder Instructions      Home Living Family/patient expects to be discharged to:: Private residence Living Arrangements: Other relatives (brother and sister-in-law) Available Help at Discharge: Family;Available 24 hours/day (both retired) Type of Home: House Home Access: Stairs to enter Technical brewer of Steps: 6 Entrance Stairs-Rails: Right;Left Home Layout: One level     Bathroom Shower/Tub: Corporate investment banker: Standard     Home Equipment: Grab bars - tub/shower          Prior Functioning/Environment Level of Independence: Independent        Comments: works in Games developer Problem List: Impaired balance (sitting and/or standing);Impaired sensation      OT Treatment/Interventions: Self-care/ADL training;Neuromuscular education;DME and/or AE instruction;Therapeutic activities;Patient/family education;Balance training    OT Goals(Current goals can be found in the care plan section) Acute Rehab OT Goals Patient Stated Goal: get well and go home OT Goal Formulation: With patient Time For Goal  Achievement: 01/30/20 Potential to Achieve Goals: Good  OT Frequency: Min 2X/week   Barriers to D/C:            Co-evaluation PT/OT/SLP Co-Evaluation/Treatment: Yes Reason for Co-Treatment: For patient/therapist safety;To address functional/ADL transfers   OT goals addressed during session: ADL's and self-care      AM-PAC OT "6 Clicks" Daily Activity     Outcome Measure Help from another person eating meals?: None Help from another person taking care of personal grooming?: A Little Help from another person toileting, which includes using toliet, bedpan, or urinal?: A Little Help from another person bathing (including washing, rinsing, drying)?: A Little Help from another person to put on and taking off regular upper body clothing?: A Little Help from another person to put on and taking off regular lower body clothing?: A Little 6 Click Score: 19   End of Session Equipment Utilized During Treatment: Gait belt Nurse Communication: Mobility status  Activity Tolerance: Patient tolerated treatment well Patient left: in chair;with call bell/phone within reach;with chair alarm set  OT Visit Diagnosis: Other abnormalities of gait and mobility (R26.89);Other symptoms and signs involving the nervous system (R29.898)                Time: 1607-3710 OT Time Calculation (min): 24 min Charges:  OT General Charges $OT Visit: 1 Visit OT Evaluation $OT Eval Moderate Complexity: 1 Mod  Jolaine Artist, OT Acute Rehabilitation Services Pager [phone] Office [phone]   [name] 01/16/2020, 11:47 AM

## 2020-01-16 NOTE — Progress Notes (Signed)
STROKE TEAM PROGRESS NOTE   INTERVAL HISTORY Patient states is doing well and left leg weakness is improved.  He has no complaints today.  Vital signs are stable.  TCD bubble study done at the bedside is negative for PFO.  TEE scheduled for tomorrow.  Vitals:   01/15/20 2322 01/16/20 0403 01/16/20 0730 01/16/20 0735  BP: 118/83 122/84  121/86  Pulse: 83 74 84 84  Resp: '14 20 16 18  ' Temp: 98.6 F (37 C) 98.2 F (36.8 C)  98.6 F (37 C)  TempSrc: Axillary Oral  Oral  SpO2: 98% 97% 100% 99%  Weight:      Height:       CBC:  Recent Labs  Lab 01/14/20 0835 01/14/20 0937  WBC 5.0  --   NEUTROABS 3.3  --   HGB 13.7 13.9  HCT 42.3 41.0  MCV 94.6  --   PLT 270  --    Basic Metabolic Panel:  Recent Labs  Lab 01/14/20 0835 01/14/20 0835 01/14/20 0937 01/15/20 0158  NA 139   < > 139 140  K 4.2   < > 6.8* 3.5  CL 104   < > 104 107  CO2 25  --   --  25  GLUCOSE 135*   < > 126* 99  BUN 15   < > 19 10  CREATININE 1.25*   < > 1.30* 1.07  CALCIUM 9.5  --   --  9.4   < > = values in this interval not displayed.   Lipid Panel:  Recent Labs  Lab 01/15/20 0158  CHOL 221*  TRIG 118  HDL 44  CHOLHDL 5.0  VLDL 24  LDLCALC 153*   HgbA1c:  Recent Labs  Lab 01/15/20 0158  HGBA1C 4.8   Urine Drug Screen:  Recent Labs  Lab 01/14/20 1040  LABOPIA NONE DETECTED  COCAINSCRNUR NONE DETECTED  LABBENZ NONE DETECTED  AMPHETMU NONE DETECTED  THCU NONE DETECTED  LABBARB NONE DETECTED    Alcohol Level  Recent Labs  Lab 01/14/20 0835  ETH <10    IMAGING past 24 hours CT CHEST ABDOMEN PELVIS W CONTRAST  Result Date: 01/15/2020 CLINICAL DATA:  Brain lesion, malignancy eval. EXAM: CT CHEST, ABDOMEN, AND PELVIS WITH CONTRAST TECHNIQUE: Multidetector CT imaging of the chest, abdomen and pelvis was performed following the standard protocol during bolus administration of intravenous contrast. CONTRAST:  130m OMNIPAQUE IOHEXOL 350 MG/ML SOLN COMPARISON:  None. FINDINGS: CT CHEST  FINDINGS Cardiovascular: Atherosclerosis of the thoracic aorta. No aortic dissection. No pulmonary embolus to the lobar level. Heart is normal in size. No pericardial effusion. Mediastinum/Nodes: No enlarged mediastinal or hilar lymph nodes. There is no axillary or supraclavicular adenopathy. No esophageal wall thickening. No visualized thyroid nodule. Lungs/Pleura: No dominant pulmonary mass. There is a 3 mm perifissural nodule in the right middle lobe, series 3, image 65. Dependent densities in both lungs typical of hypoventilatory atelectasis. Minimal subsegmental atelectasis in the lingula. Trachea and central bronchi are patent. There is no pleural fluid. No findings of pulmonary edema. Musculoskeletal: No focal osseous lesion. Mild degenerative change in the thoracic spine. No acute osseous abnormality. No chest wall or subcutaneous lesion. CT ABDOMEN PELVIS FINDINGS Hepatobiliary: There are multiple small subcentimeter low-density lesions throughout the liver that are too small to accurately characterize. Largest lesion measures 8 mm in the subcapsular right lobe, series 3, image 41. Mild background diffusely decreased hepatic density typical of steatosis. High-density material in the gallbladder is likely vicarious  excretion from head and neck CTA yesterday. This outlines multiple intraluminal gallstones. There is no biliary dilatation or pericholecystic inflammation. Pancreas: No evidence of pancreatic mass. No ductal dilatation or inflammation. Spleen: Normal in size without focal abnormality. Adrenals/Urinary Tract: No adrenal nodule. No hydronephrosis or perinephric edema. Homogeneous renal enhancement with symmetric excretion on delayed phase imaging. 7 mm low-density lesion in the lower left kidney is nonspecific but likely represent cysts. Urinary bladder is physiologically distended without wall thickening. Stomach/Bowel: Stomach is distended with enteric contrast. There is no gastric wall  thickening. No duodenal wall thickening. Normal positioning of the ligament of Treitz. Enteric contrast reaches the mid small bowel. There is no small bowel obstruction or inflammation. Normal appendix. Cecum is located in the midline pelvis. Small to moderate volume of stool throughout the colon. No obvious colonic wall thickening or mass. There is no pericolonic edema. Vascular/Lymphatic: Mild aorto bi-iliac atherosclerosis. No aortic aneurysm. The portal vein is patent. No enlarged lymph nodes in the abdomen or pelvis. Reproductive: Enlarged prostate gland spans 6.6 cm transverse. Other: No ascites. No omental thickening or evidence of peritoneal disease. Tiny fat containing umbilical hernia. Musculoskeletal: Tiny punctate density in the right iliac bone is nonspecific but favored to represent a bone island. No suspicious bone lesion. No subcutaneous or soft tissue lesion. IMPRESSION: 1. No definitive evidence of primary malignancy in the chest, abdomen, or pelvis. 2. Multiple small subcentimeter low-density lesions throughout the liver that are too small to accurately characterize, largest measures 8 mm. These are nonspecific in the setting of questioned malignancy, but in the absence of additional signs of malignancy are statistically benign cysts. 3. Tiny 3 mm perifissural right middle lobe pulmonary nodule, nonspecific. This may represent an intrapulmonary lymph node. Consider follow-up in 1 year. 4. Mild hepatic steatosis. 5. Cholelithiasis without gallbladder inflammation. 6. Enlarged prostate gland. Aortic Atherosclerosis (ICD10-I70.0). Electronically Signed   By: Keith Rake M.D.   On: 01/15/2020 20:45    PHYSICAL EXAM   Pleasant middle-age African-American male not in distress. . Afebrile. Head is nontraumatic. Neck is supple without bruit.    Cardiac exam no murmur or gallop. Lungs are clear to auscultation. Distal pulses are well felt. Neurological Exam ;  Awake  Alert oriented x 3. Normal  speech and language.eye movements full without nystagmus.fundi were not visualized. Vision acuity and fields appear normal. Hearing is normal. Palatal movements are normal. Face symmetric. Tongue midline. Normal strength, tone, reflexes and coordination except trace left hip flexor weakness on double simultaneous testing.. Normal sensation. Gait deferred.   ASSESSMENT/PLAN John Watson is a 59 y.o. male with no past medical history presenting to [facility] with transient L sided weakness and decreased sensation w/ recurrent L sided weakness when L leg weakness remains. Treated w/ tPA 01/14/2020 at 0957 and transferred to [place].   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 ASPECTS 10. R supratentorial white matter patchy attenuation ? Etiology. Focus R parietal subcortical white matter ? Recent infarct   CTA head & neck Unremarkable   CT perfusion no core   MRI w/o  R medial frontal lobe cortical edema w/ restricted diffusion. R parietal lobe 32m cortical lesion w/ mild mass effect concerning for glioma. No acute infarct.   MRI w/ contrast enhancement Contrast R frontal lesion. No contrast R parietal lesion. Infarct vs low-grade glioma. F/u contrast in 4-6  weeks  2D Echo EF 65-70%. No source of embolus   TEE and Loop  - tomorrow   TCD bubble neg  LDL 153  HgbA1c 4.8  Given his young age, will check hypercoagulable tests (lupus anticoagulant, cardiolipin antibody) and vasculitic labs (C3, C4, CH50, ANA, ESR) as well as HIV and RPR as possible stroke sources.  VTE prophylaxis - SCDs   No antithrombotic prior to admission, add aspirin 81 and plavix 75 mg daily. Continue DAPT x 3 weeks then aspirin alone.    Therapy recommendations:  No therapy needs, no SLP  Disposition:  Return home   Neurosurgery consult ([name]) - leaning  toward embolic infarcts - CT abd neg for malignancy - recommend follow up MRI in 4-6 weeks  Seizure  Intermittent L leg and arm rhythmic twitching  EEG intermittent slowing R frontotemporal region  Loaded w/ keppra followed by 750 bid   Hyperlipidemia  Home meds:  No statin  LDL 153, goal < 70  Add Lipitor 80  Continue statin at discharge  Other Stroke Risk Factors  Former Cigarette smoker  ETOH use, alcohol level <10, advised to drink no more than 2 drink(s) a day  Other Active Problems  AKI resolved 1.25-1.3-1.07  Hospital day # 2 Continue aspirin and Plavix for 3 weeks and Keppra for seizures.  Mobilize out of bed.  Ongoing therapy consults.  TEE tomorrow likely discharge home after TEE.  He will need follow-up MRI scan of the brain in 1 to 2 months to follow his unusual brain lesion.  Greater than 50% time during this 25-minute visit was spent on counseling and coordination of care about his stroke and discussion of plan of care and discussion with care team and answering questions   Antony Contras, MD  To contact Stroke Continuity provider, please refer to [email or web address] After hours, contact General Neurology

## 2020-01-16 NOTE — TOC Initial Note (Signed)
Transition of Care [facility]) - Initial/Assessment Note    Patient Details  Name: John Watson MRN: [id number] Date of Birth: March 13, 1961  [facility]) CM/SW Contact:    Pollie Friar, RN Phone Number: 01/16/2020, 3:18 PM  Clinical Narrative:                 Pt live with brother at home. Recommendations for outpatient therapy. CM met with the patient and he prefers therapy in [place]. CM entered in epic and placed information on the AVS. Pt without a PCP. Pt asking for PCP in [place]. CM was able to get an appt and placed it on the AVS.  Pt has transport home when medically ready.  Expected Discharge Plan: OP Rehab Barriers to Discharge: Continued Medical Work up   Patient Goals and CMS Choice     Choice offered to / list presented to : Patient  Expected Discharge Plan and Services Expected Discharge Plan: OP Rehab   Discharge Planning Services: CM Consult   Living arrangements for the past 2 months: Single Family Home                                      Prior Living Arrangements/Services Living arrangements for the past 2 months: Single Family Home Lives with:: Siblings Patient language and need for interpreter reviewed:: Yes Do you feel safe going back to the place where you live?: Yes      Need for Family Participation in Patient Care: No (Comment) Care giver support system in place?: Yes (comment)   Criminal Activity/Legal Involvement Pertinent to Current Situation/Hospitalization: No - Comment as needed  Activities of Daily Living      Permission Sought/Granted                  Emotional Assessment Appearance:: Appears stated age Attitude/Demeanor/Rapport: Engaged Affect (typically observed): Accepting Orientation: : Oriented to Self, Oriented to Place, Oriented to  Time, Oriented to Situation   Psych Involvement: No (comment)  Admission diagnosis:  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 Seizures ([place]) 01/16/2020  . Hyperlipidemia LDL goal <70 01/16/2020  . Tobacco use disorder 01/16/2020  . 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

## 2020-01-16 NOTE — Progress Notes (Signed)
    [facility] has been requested to perform a transesophageal echocardiogram on Jonetta Speak for stroke.  After careful review of history and examination, the risks and benefits of transesophageal echocardiogram have been explained including risks of esophageal damage, perforation (1:10,000 risk), bleeding, pharyngeal hematoma as well as other potential complications associated with conscious sedation including aspiration, arrhythmia, respiratory failure and death. Alternatives to treatment were discussed, questions were answered. Patient is willing to proceed.   Strother Everitt Ninfa Meeker, PA-C  01/16/2020 1:57 PM

## 2020-01-16 NOTE — Progress Notes (Signed)
PHARMACIST - PHYSICIAN COMMUNICATION  DR:   Leonie Man  CONCERNING: IV to Oral Route Change Policy  RECOMMENDATION: This patient is receiving Protonix by the intravenous route.  Based on criteria approved by the Pharmacy and Therapeutics Committee, the intravenous medication(s) is/are being converted to the equivalent oral dose form(s).   DESCRIPTION: These criteria include:  The patient is eating (either orally or via tube) and/or has been taking other orally administered medications for a least 24 hours  The patient has no evidence of active gastrointestinal bleeding or impaired GI absorption (gastrectomy, short bowel, patient on TNA or NPO).  If you have questions about this conversion, please contact the Pharmacy Department  []   [phone] )  [facility] []   [phone] )  [facility] []   [phone] )  [facility] []   [phone] )  [facility] []   [phone] )  [place], [facility] 01/16/2020 9:20 AM

## 2020-01-16 NOTE — Progress Notes (Signed)
Physical Therapy Treatment Patient Details Name: John Watson MRN: [id number] DOB: 1961/03/07 Today's Date: 01/16/2020    History of Present Illness 59 y.o. male with no past medical history presenting to [facility] with transient L sided weakness and decreased sensation w/ recurrent episodes. +twitching LUE and LLE with EEG showing slowing R frontotemporal region. Treated w/ tPA 01/14/2020 at 0957 and transferred to 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 Neurosurgery consulted and does not feel biopsy yet indicated. Recommending repeat MRI 4-6 weeks.     PT Comments    Patient much improved compared to 8/17 PT evaluation. Reports LLE feels weak, but no longer numb or "squeezing" sensation. Patient did have one loss of balance to his left with initial gait (required min assist to recover), however gait continuously improved with incr distance. He scored a 38/56 on Berg Balance assessment which demonstrates he is at high risk for a fall. He reports family will be with him 24/7 and can assist if needed. Encouraged to walk with nursing assist to/from bathroom (vs using urinal) and later today in the hall with nursing assist.     Follow Up Recommendations  Outpatient PT     Equipment Recommendations  None recommended by PT    Recommendations for Other Services       Precautions / Restrictions Precautions Precautions: Fall Precaution Comments: +dizziness; not orthostatic Restrictions Weight Bearing Restrictions: No    Mobility  Bed Mobility Overal bed mobility: Needs Assistance Bed Mobility: Supine to Sit     Supine to sit: Supervision     General bed mobility comments: transiton to EOB with supervision, no rail; no dizziness or headache  Transfers Overall transfer level: Needs assistance Equipment used: None Transfers: Sit to/from Stand Sit to Stand: Min guard          General transfer comment: for safety/balance  Ambulation/Gait Ambulation/Gait assistance: Min assist Gait Distance (Feet): 10 Feet (85 in the room) Assistive device: None Gait Pattern/deviations: Step-through pattern;Wide base of support;Staggering left     General Gait Details: pt with staggering LOB to his left within first 20 feet of ambulation; remainder of time he did not lose his balance while walking and gait dynamics improved   Stairs             Wheelchair Mobility    Modified Rankin (Stroke Patients Only) Modified Rankin (Stroke Patients Only) Pre-Morbid Rankin Score: No symptoms Modified Rankin: Moderately severe disability     Balance Overall balance assessment: Needs assistance Sitting-balance support: No upper extremity supported;Feet supported Sitting balance-Leahy Scale: Fair Sitting balance - Comments: able manage socks with supervision    Standing balance support: No upper extremity supported;During functional activity Standing balance-Leahy Scale: Fair Standing balance comment: patient requires support for dynamic balance                 Standardized Balance Assessment Standardized Balance Assessment : Berg Balance Test Berg Balance Test Sit to Stand: Able to stand  independently using hands Standing Unsupported: Able to stand 2 minutes with supervision Sitting with Back Unsupported but Feet Supported on Floor or Stool: Able to sit safely and securely 2 minutes Stand to Sit: Controls descent by using hands Transfers: Able to transfer with verbal cueing and /or supervision Standing Unsupported with Eyes Closed: Able to stand 10 seconds with supervision Standing Ubsupported with Feet Together: Able to place feet together independently and stand for 1 minute with  supervision From Standing, Reach Forward with Outstretched Arm: Can reach confidently >25 cm (10") From Standing Position, Pick up Object from Floor: Able to pick up shoe safely and  easily From Standing Position, Turn to Look Behind Over each Shoulder: Looks behind one side only/other side shows less weight shift Turn 360 Degrees: Able to turn 360 degrees safely but slowly Standing Unsupported, Alternately Place Feet on Step/Stool: Able to complete >2 steps/needs minimal assist Standing Unsupported, One Foot in Front: Able to take small step independently and hold 30 seconds Standing on One Leg: Tries to lift leg/unable to hold 3 seconds but remains standing independently Total Score: 38        Cognition Arousal/Alertness: Awake/alert Behavior During Therapy: WFL for tasks assessed/performed Overall Cognitive Status: Within Functional Limits for tasks assessed                                        Exercises Other Exercises Other Exercises: Educated to walk with nursing (to/from bathroom; out in hall during second shift when fewer staff members will recognize him and bombard him with questions). Educated to perform AROM of bil legs while seated or supine    General Comments General comments (skin integrity, edema, etc.): denies numbness, tingling, heaviness in LLE; states LLE does feel weak      Pertinent Vitals/Pain Pain Assessment: No/denies pain    Home Living Family/patient expects to be discharged to:: Private residence Living Arrangements: Other relatives (brother and sister-in-law) Available Help at Discharge: Family;Available 24 hours/day (both retired) Type of Home: House Home Access: Stairs to enter Entrance Stairs-Rails: Right;Left Home Layout: One level Home Equipment: Grab bars - tub/shower      Prior Function Level of Independence: Independent      Comments: works in [employer]    PT Goals (current goals can now be found in the care plan section) Acute Rehab PT Goals Patient Stated Goal: get well and go home Time For Goal Achievement: 01/15/20 Potential to Achieve Goals: Good Progress towards PT goals:  Progressing toward goals    Frequency    Min 4X/week      PT Plan Discharge plan needs to be updated    Co-evaluation PT/OT/SLP Co-Evaluation/Treatment: Yes Reason for Co-Treatment: Complexity of the patient's impairments (multi-system involvement);For patient/therapist safety (based on abilities 8/17 PT eval)   OT goals addressed during session: ADL's and self-care      AM-PAC PT "6 Clicks" Mobility   Outcome Measure  Help needed turning from your back to your side while in a flat bed without using bedrails?: None Help needed moving from lying on your back to sitting on the side of a flat bed without using bedrails?: None Help needed moving to and from a bed to a chair (including a wheelchair)?: A Little Help needed standing up from a chair using your arms (e.g., wheelchair or bedside chair)?: A Little Help needed to walk in hospital room?: A Little Help needed climbing 3-5 steps with a railing? : Total 6 Click Score: 18    End of Session Equipment Utilized During Treatment: Gait belt Activity Tolerance: Patient tolerated treatment well Patient left: with call bell/phone within reach;in chair;with chair alarm set Nurse Communication: Mobility status PT Visit Diagnosis: Dizziness and giddiness (R42);Other symptoms and signs involving the nervous system (S93.734)     Time: 2876-8115 PT Time Calculation (min) (ACUTE ONLY): 25 min  Charges:  $  Gait Training: 8-22 mins                      Arby Barrette, PT Pager [phone]    Rexanne Mano 01/16/2020, 12:49 PM

## 2020-01-16 NOTE — Progress Notes (Signed)
TCD Bubble study has been completed.   Preliminary results in CV Proc.   John Watson 01/16/2020 2:31 PM

## 2020-01-16 NOTE — H&P (View-Only) (Signed)
STROKE TEAM PROGRESS NOTE   INTERVAL HISTORY Patient states is doing well and left leg weakness is improved.  He has no complaints today.  Vital signs are stable.  TCD bubble study done at the bedside is negative for PFO.  TEE scheduled for tomorrow.  Vitals:   01/15/20 2322 01/16/20 0403 01/16/20 0730 01/16/20 0735  BP: 118/83 122/84  121/86  Pulse: 83 74 84 84  Resp: '14 20 16 18  ' Temp: 98.6 F (37 C) 98.2 F (36.8 C)  98.6 F (37 C)  TempSrc: Axillary Oral  Oral  SpO2: 98% 97% 100% 99%  Weight:      Height:       CBC:  Recent Labs  Lab 01/14/20 0835 01/14/20 0937  WBC 5.0  --   NEUTROABS 3.3  --   HGB 13.7 13.9  HCT 42.3 41.0  MCV 94.6  --   PLT 270  --    Basic Metabolic Panel:  Recent Labs  Lab 01/14/20 0835 01/14/20 0835 01/14/20 0937 01/15/20 0158  NA 139   < > 139 140  K 4.2   < > 6.8* 3.5  CL 104   < > 104 107  CO2 25  --   --  25  GLUCOSE 135*   < > 126* 99  BUN 15   < > 19 10  CREATININE 1.25*   < > 1.30* 1.07  CALCIUM 9.5  --   --  9.4   < > = values in this interval not displayed.   Lipid Panel:  Recent Labs  Lab 01/15/20 0158  CHOL 221*  TRIG 118  HDL 44  CHOLHDL 5.0  VLDL 24  LDLCALC 153*   HgbA1c:  Recent Labs  Lab 01/15/20 0158  HGBA1C 4.8   Urine Drug Screen:  Recent Labs  Lab 01/14/20 1040  LABOPIA NONE DETECTED  COCAINSCRNUR NONE DETECTED  LABBENZ NONE DETECTED  AMPHETMU NONE DETECTED  THCU NONE DETECTED  LABBARB NONE DETECTED    Alcohol Level  Recent Labs  Lab 01/14/20 0835  ETH <10    IMAGING past 24 hours CT CHEST ABDOMEN PELVIS W CONTRAST  Result Date: 01/15/2020 CLINICAL DATA:  Brain lesion, malignancy eval. EXAM: CT CHEST, ABDOMEN, AND PELVIS WITH CONTRAST TECHNIQUE: Multidetector CT imaging of the chest, abdomen and pelvis was performed following the standard protocol during bolus administration of intravenous contrast. CONTRAST:  194m OMNIPAQUE IOHEXOL 350 MG/ML SOLN COMPARISON:  None. FINDINGS: CT CHEST  FINDINGS Cardiovascular: Atherosclerosis of the thoracic aorta. No aortic dissection. No pulmonary embolus to the lobar level. Heart is normal in size. No pericardial effusion. Mediastinum/Nodes: No enlarged mediastinal or hilar lymph nodes. There is no axillary or supraclavicular adenopathy. No esophageal wall thickening. No visualized thyroid nodule. Lungs/Pleura: No dominant pulmonary mass. There is a 3 mm perifissural nodule in the right middle lobe, series 3, image 65. Dependent densities in both lungs typical of hypoventilatory atelectasis. Minimal subsegmental atelectasis in the lingula. Trachea and central bronchi are patent. There is no pleural fluid. No findings of pulmonary edema. Musculoskeletal: No focal osseous lesion. Mild degenerative change in the thoracic spine. No acute osseous abnormality. No chest wall or subcutaneous lesion. CT ABDOMEN PELVIS FINDINGS Hepatobiliary: There are multiple small subcentimeter low-density lesions throughout the liver that are too small to accurately characterize. Largest lesion measures 8 mm in the subcapsular right lobe, series 3, image 41. Mild background diffusely decreased hepatic density typical of steatosis. High-density material in the gallbladder is likely vicarious  excretion from head and neck CTA yesterday. This outlines multiple intraluminal gallstones. There is no biliary dilatation or pericholecystic inflammation. Pancreas: No evidence of pancreatic mass. No ductal dilatation or inflammation. Spleen: Normal in size without focal abnormality. Adrenals/Urinary Tract: No adrenal nodule. No hydronephrosis or perinephric edema. Homogeneous renal enhancement with symmetric excretion on delayed phase imaging. 7 mm low-density lesion in the lower left kidney is nonspecific but likely represent cysts. Urinary bladder is physiologically distended without wall thickening. Stomach/Bowel: Stomach is distended with enteric contrast. There is no gastric wall  thickening. No duodenal wall thickening. Normal positioning of the ligament of Treitz. Enteric contrast reaches the mid small bowel. There is no small bowel obstruction or inflammation. Normal appendix. Cecum is located in the midline pelvis. Small to moderate volume of stool throughout the colon. No obvious colonic wall thickening or mass. There is no pericolonic edema. Vascular/Lymphatic: Mild aorto bi-iliac atherosclerosis. No aortic aneurysm. The portal vein is patent. No enlarged lymph nodes in the abdomen or pelvis. Reproductive: Enlarged prostate gland spans 6.6 cm transverse. Other: No ascites. No omental thickening or evidence of peritoneal disease. Tiny fat containing umbilical hernia. Musculoskeletal: Tiny punctate density in the right iliac bone is nonspecific but favored to represent a bone island. No suspicious bone lesion. No subcutaneous or soft tissue lesion. IMPRESSION: 1. No definitive evidence of primary malignancy in the chest, abdomen, or pelvis. 2. Multiple small subcentimeter low-density lesions throughout the liver that are too small to accurately characterize, largest measures 8 mm. These are nonspecific in the setting of questioned malignancy, but in the absence of additional signs of malignancy are statistically benign cysts. 3. Tiny 3 mm perifissural right middle lobe pulmonary nodule, nonspecific. This may represent an intrapulmonary lymph node. Consider follow-up in 1 year. 4. Mild hepatic steatosis. 5. Cholelithiasis without gallbladder inflammation. 6. Enlarged prostate gland. Aortic Atherosclerosis (ICD10-I70.0). Electronically Signed   By: Keith Rake M.D.   On: 01/15/2020 20:45    PHYSICAL EXAM   Pleasant middle-age African-American male not in distress. . Afebrile. Head is nontraumatic. Neck is supple without bruit.    Cardiac exam no murmur or gallop. Lungs are clear to auscultation. Distal pulses are well felt. Neurological Exam ;  Awake  Alert oriented x 3. Normal  speech and language.eye movements full without nystagmus.fundi were not visualized. Vision acuity and fields appear normal. Hearing is normal. Palatal movements are normal. Face symmetric. Tongue midline. Normal strength, tone, reflexes and coordination except trace left hip flexor weakness on double simultaneous testing.. Normal sensation. Gait deferred.   ASSESSMENT/PLAN John Watson is a 59 y.o. male with no past medical history presenting to [facility] with transient L sided weakness and decreased sensation w/ recurrent L sided weakness when L leg weakness remains. Treated w/ tPA 01/14/2020 at 0957 and transferred to [facility].   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 ASPECTS 10. R supratentorial white matter patchy attenuation ? Etiology. Focus R parietal subcortical white matter ? Recent infarct   CTA head & neck Unremarkable   CT perfusion no core   MRI w/o  R medial frontal lobe cortical edema w/ restricted diffusion. R parietal lobe 69m cortical lesion w/ mild mass effect concerning for glioma. No acute infarct.   MRI w/ contrast enhancement Contrast R frontal lesion. No contrast R parietal lesion. Infarct vs low-grade glioma. F/u contrast in 4-6  weeks  2D Echo EF 65-70%. No source of embolus   TEE and Loop  - tomorrow   TCD bubble neg  LDL 153  HgbA1c 4.8  Given his young age, will check hypercoagulable tests (lupus anticoagulant, cardiolipin antibody) and vasculitic labs (C3, C4, CH50, ANA, ESR) as well as HIV and RPR as possible stroke sources.  VTE prophylaxis - SCDs   No antithrombotic prior to admission, add aspirin 81 and plavix 75 mg daily. Continue DAPT x 3 weeks then aspirin alone.    Therapy recommendations:  No therapy needs, no SLP  Disposition:  Return home   Neurosurgery consult ([name]) - leaning  toward embolic infarcts - CT abd neg for malignancy - recommend follow up MRI in 4-6 weeks  Seizure  Intermittent L leg and arm rhythmic twitching  EEG intermittent slowing R frontotemporal region  Loaded w/ keppra followed by 750 bid   Hyperlipidemia  Home meds:  No statin  LDL 153, goal < 70  Add Lipitor 80  Continue statin at discharge  Other Stroke Risk Factors  Former Cigarette smoker  ETOH use, alcohol level <10, advised to drink no more than 2 drink(s) a day  Other Active Problems  AKI resolved 1.25-1.3-1.07  Hospital day # 2 Continue aspirin and Plavix for 3 weeks and Keppra for seizures.  Mobilize out of bed.  Ongoing therapy consults.  TEE tomorrow likely discharge home after TEE.  He will need follow-up MRI scan of the brain in 1 to 2 months to follow his unusual brain lesion.  Greater than 50% time during this 25-minute visit was spent on counseling and coordination of care about his stroke and discussion of plan of care and discussion with care team and answering questions   Antony Contras, MD  To contact Stroke Continuity provider, please refer to [email or web address] After hours, contact General Neurology

## 2020-01-16 NOTE — Progress Notes (Signed)
   Providing Compassionate, Quality Care - Together  NEUROSURGERY PROGRESS NOTE   S: No issues overnight. Tingling better in leg, ha improved  O: EXAM:  BP 121/86 (BP Location: Right Arm)   Pulse 84   Temp 98.6 F (37 C) (Oral)   Resp 18   Ht 5\' 11"  (1.803 m)   Wt 89.9 kg   SpO2 99%   BMI 27.63 kg/m   Awake, alert, oriented  Speech fluent, appropriate  CNs grossly intact  RUE 5/5, LUE 4+/5 BLE 5/5 SILT except in distal LUE  ASSESSMENT:  59 y.o. male with  1.  Right frontal and right parietal lesions 2.  Left-sided weakness, resolved  Plan:  -MRI reviewed, given the multifocal nature of the lesions I recommend repeat MRI in 4 to 6 weeks at this time.  There is no significant mass-effect or edema surrounding the lesions.  The right frontal lesion has some small enhancement which could be a low-grade glioma however a low-grade glioma would not be multifocal.  Therefore I recommend repeat imaging in the short interval. -CT chest abdomen pelvis for eval of malignancy, neg -Stroke work-up negative -I recommend no steroids at this time given there is no significant mass-effect. -Continue antiepileptics per neurology -PT OT -Pain control -f/u in 6 weeks with repeat mri w/w/o brain -will sign off, please call with questions or concerns    Thank you for allowing me to participate in this patient's care.  Please do not hesitate to call with questions or concerns.   Elwin Sleight, [facility] Cell: [phone]

## 2020-01-17 ENCOUNTER — Encounter (HOSPITAL_COMMUNITY)
Admission: EM | Disposition: A | Discharge status: Home / Self Care | Payer: Self-pay | Source: Home / Self Care | Attending: Neurology

## 2020-01-17 ENCOUNTER — Inpatient Hospital Stay (HOSPITAL_COMMUNITY): Payer: Self-pay

## 2020-01-17 ENCOUNTER — Inpatient Hospital Stay (HOSPITAL_COMMUNITY): Payer: Self-pay | Admitting: Anesthesiology

## 2020-01-17 ENCOUNTER — Encounter (HOSPITAL_COMMUNITY): Payer: Self-pay | Admitting: Neurology

## 2020-01-17 DIAGNOSIS — I517 Cardiomegaly: Secondary | ICD-10-CM

## 2020-01-17 DIAGNOSIS — I639 Cerebral infarction, unspecified: Secondary | ICD-10-CM

## 2020-01-17 HISTORY — PX: BUBBLE STUDY: SHX6837

## 2020-01-17 HISTORY — PX: TEE WITHOUT CARDIOVERSION: SHX5443

## 2020-01-17 HISTORY — PX: LOOP RECORDER INSERTION: EP1214

## 2020-01-17 LAB — CARDIOLIPIN ANTIBODIES, IGG, IGM, IGA
Anticardiolipin IgA: <9 APL U/mL (ref 0–11)
Anticardiolipin IgG: <9 GPL U/mL (ref 0–14)
Anticardiolipin IgM: <9 MPL U/mL (ref 0–12)

## 2020-01-17 LAB — C4 COMPLEMENT: Complement C4, Body Fluid: 18 mg/dL (ref 12–38)

## 2020-01-17 LAB — C3 COMPLEMENT: C3 Complement: 108 mg/dL (ref 82–167)

## 2020-01-17 LAB — LUPUS ANTICOAGULANT PANEL
DRVVT: 37.4 s (ref 0.0–47.0)
PTT Lupus Anticoagulant: 34.2 s (ref 0.0–51.9)

## 2020-01-17 LAB — ANTINUCLEAR ANTIBODIES, IFA: ANA Ab, IFA: NEGATIVE

## 2020-01-17 LAB — RPR: RPR Ser Ql: NONREACTIVE — AB

## 2020-01-17 LAB — COMPLEMENT, TOTAL: Compl, Total (CH50): >60 U/mL (ref >41)

## 2020-01-17 SURGERY — LOOP RECORDER INSERTION
Anesthesia: LOCAL

## 2020-01-17 SURGERY — ECHOCARDIOGRAM, TRANSESOPHAGEAL
Anesthesia: Monitor Anesthesia Care

## 2020-01-17 MED ORDER — CHLORHEXIDINE GLUCONATE CLOTH 2 % EX PADS: 6.0000 | MEDICATED_PAD | Freq: Every day | CUTANEOUS | Status: DC

## 2020-01-17 MED ORDER — ATORVASTATIN CALCIUM 80 MG PO TABS: 80.0000 mg | ORAL_TABLET | Freq: Every day | ORAL | 2 refills | Status: DC

## 2020-01-17 MED ORDER — LEVETIRACETAM 750 MG PO TABS: 750.0000 mg | ORAL_TABLET | Freq: Two times a day (BID) | ORAL | 2 refills | Status: DC

## 2020-01-17 MED ORDER — PROPOFOL 500 MG/50ML IV EMUL
INTRAVENOUS | Status: DC | PRN
  Administered 2020-01-17: 125 ug/kg/min via INTRAVENOUS

## 2020-01-17 MED ORDER — LIDOCAINE 2% (20 MG/ML) 5 ML SYRINGE
INTRAMUSCULAR | Status: DC | PRN
  Administered 2020-01-17: 40 mg via INTRAVENOUS

## 2020-01-17 MED ORDER — LIDOCAINE-EPINEPHRINE 1 %-1:100000 IJ SOLN
INTRAMUSCULAR | Status: DC | PRN
  Administered 2020-01-17: 30 mL

## 2020-01-17 MED ORDER — LIDOCAINE-EPINEPHRINE 1 %-1:100000 IJ SOLN
INTRAMUSCULAR | Status: AC
  Filled 2020-01-17: qty 1

## 2020-01-17 MED ORDER — ASPIRIN 81 MG PO TBEC: 81.0000 mg | DELAYED_RELEASE_TABLET | Freq: Every day | ORAL | 11 refills | Status: DC

## 2020-01-17 MED ORDER — CLOPIDOGREL BISULFATE 75 MG PO TABS: 75.0000 mg | ORAL_TABLET | Freq: Every day | ORAL | 0 refills | Status: DC

## 2020-01-17 SURGICAL SUPPLY — 2 items
PACK LOOP INSERTION (CUSTOM PROCEDURE TRAY) ×2 IMPLANT
SYSTEM MONITOR REVEAL LINQ II (Prosthesis & Implant Heart) ×1 IMPLANT

## 2020-01-17 NOTE — CV Procedure (Signed)
     TRANSESOPHAGEAL ECHOCARDIOGRAM   NAME:  John Watson   MRN: [id number] DOB:  [birth year]   ADMIT DATE: 01/14/2020  INDICATIONS: CVA  PROCEDURE:   Informed consent was obtained prior to the procedure. The risks, benefits and alternatives for the procedure were discussed and the patient comprehended these risks.  Risks include, but are not limited to, cough, sore throat, vomiting, nausea, somnolence, esophageal and stomach trauma or perforation, bleeding, low blood pressure, aspiration, pneumonia, infection, trauma to the teeth and death.    After a procedural time-out, the oropharynx was anesthetized and the patient was sedated by the anesthesia service. The transesophageal probe was inserted in the esophagus and stomach without difficulty and multiple views were obtained. Anesthesia was monitored by Dr Ola Spurr    COMPLICATIONS:    There were no immediate complications.  FINDINGS:  Minimally positive bubble study, could be consistent with very small PFO   [name] MD [facility]  [address], [place], [place] [address] [phone]   12:04 PM

## 2020-01-17 NOTE — Consult Note (Signed)
ELECTROPHYSIOLOGY CONSULT NOTE  Patient ID: John Watson MRN: [id number], DOB/AGE: 08-03-1960   Admit date: 01/14/2020 Date of Consult: 01/17/2020  Primary Physician: Patient, No Pcp Per Primary Cardiologist: No primary care provider on file.  Primary Electrophysiologist: New to Dr. Quentin Watson Reason for Consultation: Cryptogenic stroke; recommendations regarding Implantable Loop Recorder Insurance: Commercial (Focus?) -> will confirm with CSW.  History of Present Illness EP has been asked to evaluate John Watson for placement of an implantable loop recorder to monitor for atrial fibrillation by Dr Leonie Man.  The patient was admitted on 01/14/2020 with left sided weakness. Treated with TPA 01/14/2020. 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 Neurosurgery consulted and leaning towards embolic infarcts with CT abd negative for malignancy. Planning follow up MRI in 4-6 weeks.  They have undergone workup for stroke including echocardiogram and CTA Head and Neck.  The patient has been monitored on telemetry which has demonstrated sinus rhythm with no arrhythmias.  Inpatient stroke work-up will require a TEE per Neurology.   Echocardiogram this admission demonstrated 65-70%.  Lab work is reviewed.  Prior to admission, the patient denies chest pain, shortness of breath, dizziness, palpitations, or syncope.  They are recovering from their stroke with plans to return home  at discharge.  History reviewed. No pertinent past medical history.   Surgical History: History reviewed. No pertinent surgical history.   Medications Prior to Admission  Medication Sig Dispense Refill Last Dose  . azithromycin (ZITHROMAX) 250 MG tablet Take 1 tablet (250 mg total) by mouth daily. (Patient not taking: Reported on 01/14/2020) 4 tablet 0 Not Taking at Unknown time  . oxyCODONE-acetaminophen (PERCOCET/ROXICET) 5-325 MG per tablet 1 to 2 tabs PO q6hrs  PRN for pain (Patient not  taking: Reported on 01/14/2020) 15 tablet 0 Not Taking at Unknown time    Inpatient Medications:  . aspirin EC  81 mg Oral Daily  . atorvastatin  80 mg Oral Daily  . Chlorhexidine Gluconate Cloth  6 each Topical Q0600  . clopidogrel  75 mg Oral Daily  . levETIRAcetam  750 mg Oral BID  . mupirocin ointment  1 application Nasal BID  . pantoprazole  40 mg Oral QHS    Allergies: No Known Allergies  Social History   Socioeconomic History  . Marital status: Single    Spouse name: Not on file  . Number of children: Not on file  . Years of education: Not on file  . Highest education level: Not on file  Occupational History  . Not on file  Tobacco Use  . Smoking status: Former Smoker    Types: Cigars  . Smokeless tobacco: Never Used  Substance and Sexual Activity  . Alcohol use: Yes    Comment: occasionally  . Drug use: No  . Sexual activity: Yes    Birth control/protection: None  Other Topics Concern  . Not on file  Social History Narrative  . Not on file   Social Determinants of Health   Financial Resource Strain:   . Difficulty of Paying Living Expenses: Not on file  Food Insecurity:   . Worried About Charity fundraiser in the Last Year: Not on file  . Ran Out of Food in the Last Year: Not on file  Transportation Needs:   . Lack of Transportation (Medical): Not on file  . Lack of Transportation (Non-Medical): Not on file  Physical Activity:   . Days of Exercise per Week: Not on file  .  Minutes of Exercise per Session: Not on file  Stress:   . Feeling of Stress : Not on file  Social Connections:   . Frequency of Communication with Friends and Family: Not on file  . Frequency of Social Gatherings with Friends and Family: Not on file  . Attends Religious Services: Not on file  . Active Member of Clubs or Organizations: Not on file  . Attends Archivist Meetings: Not on file  . Marital Status: Not on file  Intimate Partner Violence:   . Fear of Current  or Ex-Partner: Not on file  . Emotionally Abused: Not on file  . Physically Abused: Not on file  . Sexually Abused: Not on file     Family History  Problem Relation Age of Onset  . Hypertension Mother   . Lung cancer Father   . Hyperlipidemia Sister   . Hyperlipidemia Brother       Review of Systems: All other systems reviewed and are otherwise negative except as noted above.  Physical Exam: Vitals:   01/16/20 0735 01/16/20 2017 01/16/20 2354 01/17/20 0441  BP: 121/86 130/89 138/82 (!) 145/90  Pulse: 84 77 73 (!) 54  Resp: 18 17 17 13   Temp: 98.6 F (37 C) 98.2 F (36.8 C) 98.3 F (36.8 C) 97.8 F (36.6 C)  TempSrc: Oral  Oral Oral  SpO2: 99% 98% 100% 98%  Weight:      Height:        GEN- The patient is well appearing, alert and oriented x 3 today.   Head- normocephalic, atraumatic Eyes-  Sclera clear, conjunctiva pink Ears- hearing intact Oropharynx- clear Neck- supple Lungs- Clear to ausculation bilaterally, normal work of breathing Heart- Regular rate and rhythm, no murmurs, rubs or gallops  GI- soft, NT, ND, + BS Extremities- no clubbing, cyanosis, or edema MS- no significant deformity or atrophy Skin- no rash or lesion Psych- euthymic mood, full affect   Labs:   Lab Results  Component Value Date   WBC 5.0 01/14/2020   HGB 13.9 01/14/2020   HCT 41.0 01/14/2020   MCV 94.6 01/14/2020   PLT 270 01/14/2020    Recent Labs  Lab 01/14/20 0835 01/14/20 0937 01/15/20 0158  NA 139   < > 140  K 4.2   < > 3.5  CL 104   < > 107  CO2 25   < > 25  BUN 15   < > 10  CREATININE 1.25*   < > 1.07  CALCIUM 9.5   < > 9.4  PROT 7.4  --   --   BILITOT 1.2  --   --   ALKPHOS 43  --   --   ALT 18  --   --   AST 18  --   --   GLUCOSE 135*   < > 99   < > = values in this interval not displayed.     Radiology/Studies: EEG  Result Date: 01/14/2020 John Havens, MD     01/14/2020  3:43 PM Patient Name: John Watson MRN: [id number] Epilepsy Attending:  Lora Watson Referring Physician/Provider: Etta Quill, PA Date: 01/14/2020 Duration: 22.03 minutes Patient history: 59 year old male with sudden onset left upper and lower extremity weakness.  EEG to evaluate for seizures. Level of alertness: Awake AEDs during EEG study: Keppra Technical aspects: This EEG study was done with scalp electrodes positioned according to the 10-20 International system of electrode placement. Electrical activity was acquired at a  sampling rate of 500Hz  and reviewed with a high frequency filter of 70Hz  and a low frequency filter of 1Hz . EEG data were recorded continuously and digitally stored. Description: The posterior dominant rhythm consists of 9.5 Hz activity of moderate voltage (25-35 uV) seen predominantly in posterior head regions, symmetric and reactive to eye opening and eye closing. EEG showed intermittent 3 to 6 Hz theta-delta slowing in the right frontotemporal region.  Hyperventilation and photic stimulation were not performed.   ABNORMALITY -Intermittent slow, right frontotemporal region IMPRESSION: This study is suggestive of cortical dysfunction in right frontotemporal region, nonspecific etiology.  No seizures or epileptiform discharges were seen throughout the recording. John Watson   CT HEAD WO CONTRAST  Result Date: 01/15/2020 CLINICAL DATA:  Stroke follow-up EXAM: CT HEAD WITHOUT CONTRAST TECHNIQUE: Contiguous axial images were obtained from the base of the skull through the vertex without intravenous contrast. COMPARISON:  Head CT from yesterday FINDINGS: Brain: The parasagittal right posterior frontal lesion is mildly dense by CT. The lateral right parietal lesion is low-density. No hematoma, hydrocephalus, shift, or interval finding. Small remote left cerebellar infarction. Vascular: No hyperdense vessel or unexpected calcification. Skull: Negative Sinuses/Orbits: Negative IMPRESSION: Stable low-density right parietal and high-density right frontal  lesions, see preceding brain MRI. Electronically Signed   By: Monte Fantasia M.D.   On: 01/15/2020 05:44   MR BRAIN WO CONTRAST  Result Date: 01/14/2020 CLINICAL DATA:  Follow-up stroke EXAM: MRI HEAD WITHOUT CONTRAST TECHNIQUE: Multiplanar, multiecho pulse sequences of the brain and surrounding structures were obtained without intravenous contrast. COMPARISON:  CT head 01/14/2020 FINDINGS: Brain: Cortical edema in the right medial frontal lobe posteriorly with facilitated diffusion. Cortical edema in the right parietal cortex without abnormality on diffusion. This is higher signal on T2 than the lesion in the right frontal lobe and appears to have some masslike characteristics. No abnormality on diffusion-weighted imaging. Possible cortical tumor. This area measures approximately 23 mm in diameter. Few small scattered hyperintensities in the white matter bilaterally. Negative for hemorrhage or mass. Vascular: Normal arterial flow voids. Skull and upper cervical spine: No focal skeletal lesion. Sinuses/Orbits: Mild mucosal edema paranasal sinuses. Negative orbit Other: None IMPRESSION: Cortical edema with restricted diffusion in the right medial frontal lobe. Differential diagnosis includes subacute infarct, seizure related activity, and less likely tumor. 23 mm cortically based lesion in the right parietal lobe with mild mass-effect. 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 Negative for acute infarct Postcontrast imaging may be helpful. Follow-up imaging may also be helpful to evaluate for evolutionary changes of ischemia. These results were called by telephone at the time of interpretation on 01/14/2020 at 4:24 pm to provider Leonel Ramsay, who verbally acknowledged these results. Electronically Signed   By: Franchot Gallo M.D.   On: 01/14/2020 16:25   MR BRAIN W CONTRAST  Result Date: 01/14/2020 CLINICAL DATA:  Brain mass EXAM: MRI HEAD WITH CONTRAST TECHNIQUE: Multiplanar, multiecho pulse sequences of the  brain and surrounding structures were obtained with intravenous contrast. CONTRAST:  52mL GADAVIST GADOBUTROL 1 MMOL/ML IV SOLN COMPARISON:  Brain MRI without contrast 01/14/2020 FINDINGS: In the paramedian superior right frontal lobe, there is an area of poorly circumscribed contrast enhancement in the region of the FLAIR abnormality shown on the earlier study. The area of enhancement measures approximately 16 x 9 mm. There is no contrast enhancement at the site of the posterior right parietal lobe lesion. IMPRESSION: Area of poorly circumscribed contrast enhancement associated with the right frontal lesion, but not with the  right parietal lesion. 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 Follow-up MRI with and without contrast in 4-6 weeks might be helpful for differentiation. Electronically Signed   By: Ulyses Jarred M.D.   On: 01/14/2020 21:52   CT CHEST ABDOMEN PELVIS W CONTRAST  Result Date: 01/15/2020 CLINICAL DATA:  Brain lesion, malignancy eval. EXAM: CT CHEST, ABDOMEN, AND PELVIS WITH CONTRAST TECHNIQUE: Multidetector CT imaging of the chest, abdomen and pelvis was performed following the standard protocol during bolus administration of intravenous contrast. CONTRAST:  162mL OMNIPAQUE IOHEXOL 350 MG/ML SOLN COMPARISON:  None. FINDINGS: CT CHEST FINDINGS Cardiovascular: Atherosclerosis of the thoracic aorta. No aortic dissection. No pulmonary embolus to the lobar level. Heart is normal in size. No pericardial effusion. Mediastinum/Nodes: No enlarged mediastinal or hilar lymph nodes. There is no axillary or supraclavicular adenopathy. No esophageal wall thickening. No visualized thyroid nodule. Lungs/Pleura: No dominant pulmonary mass. There is a 3 mm perifissural nodule in the right middle lobe, series 3, image 65. Dependent densities in both lungs typical of hypoventilatory atelectasis. Minimal subsegmental atelectasis in the lingula. Trachea and central bronchi are  patent. There is no pleural fluid. No findings of pulmonary edema. Musculoskeletal: No focal osseous lesion. Mild degenerative change in the thoracic spine. No acute osseous abnormality. No chest wall or subcutaneous lesion. CT ABDOMEN PELVIS FINDINGS Hepatobiliary: There are multiple small subcentimeter low-density lesions throughout the liver that are too small to accurately characterize. Largest lesion measures 8 mm in the subcapsular right lobe, series 3, image 41. Mild background diffusely decreased hepatic density typical of steatosis. High-density material in the gallbladder is likely vicarious excretion from head and neck CTA yesterday. This outlines multiple intraluminal gallstones. There is no biliary dilatation or pericholecystic inflammation. Pancreas: No evidence of pancreatic mass. No ductal dilatation or inflammation. Spleen: Normal in size without focal abnormality. Adrenals/Urinary Tract: No adrenal nodule. No hydronephrosis or perinephric edema. Homogeneous renal enhancement with symmetric excretion on delayed phase imaging. 7 mm low-density lesion in the lower left kidney is nonspecific but likely represent cysts. Urinary bladder is physiologically distended without wall thickening. Stomach/Bowel: Stomach is distended with enteric contrast. There is no gastric wall thickening. No duodenal wall thickening. Normal positioning of the ligament of Treitz. Enteric contrast reaches the mid small bowel. There is no small bowel obstruction or inflammation. Normal appendix. Cecum is located in the midline pelvis. Small to moderate volume of stool throughout the colon. No obvious colonic wall thickening or mass. There is no pericolonic edema. Vascular/Lymphatic: Mild aorto bi-iliac atherosclerosis. No aortic aneurysm. The portal vein is patent. No enlarged lymph nodes in the abdomen or pelvis. Reproductive: Enlarged prostate gland spans 6.6 cm transverse. Other: No ascites. No omental thickening or evidence  of peritoneal disease. Tiny fat containing umbilical hernia. Musculoskeletal: Tiny punctate density in the right iliac bone is nonspecific but favored to represent a bone island. No suspicious bone lesion. No subcutaneous or soft tissue lesion. IMPRESSION: 1. No definitive evidence of primary malignancy in the chest, abdomen, or pelvis. 2. Multiple small subcentimeter low-density lesions throughout the liver that are too small to accurately characterize, largest measures 8 mm. These are nonspecific in the setting of questioned malignancy, but in the absence of additional signs of malignancy are statistically benign cysts. 3. Tiny 3 mm perifissural right middle lobe pulmonary nodule, nonspecific. This may represent an intrapulmonary lymph node. Consider follow-up in 1 year. 4. Mild hepatic steatosis. 5. Cholelithiasis without gallbladder inflammation. 6. Enlarged prostate gland. Aortic Atherosclerosis (ICD10-I70.0). Electronically  Signed   By: Keith Rake M.D.   On: 01/15/2020 20:45   CT CEREBRAL PERFUSION W CONTRAST  Result Date: 01/14/2020 CLINICAL DATA:  Code stroke follow-up, left-sided weakness EXAM: CT ANGIOGRAPHY HEAD AND NECK CT PERFUSION BRAIN TECHNIQUE: Multidetector CT imaging of the head and neck was performed using the standard protocol during bolus administration of intravenous contrast. Multiplanar CT image reconstructions and MIPs were obtained to evaluate the vascular anatomy. Carotid stenosis measurements (when applicable) are obtained utilizing NASCET criteria, using the distal internal carotid diameter as the denominator. Multiphase CT imaging of the brain was performed following IV bolus contrast injection. Subsequent parametric perfusion maps were calculated using RAPID software. CONTRAST:  174mL OMNIPAQUE IOHEXOL 350 MG/ML SOLN COMPARISON:  None. FINDINGS: CTA NECK FINDINGS Aortic arch: Great vessel origins are patent. Right carotid system: Patent. No measurable stenosis at the ICA  origin. Left carotid system: Patent. Minimal calcified plaque at the ICA origin without measurable stenosis. Vertebral arteries: Patent and codominant. Skeleton: Degenerative changes of the cervical spine Other neck: No mass or adenopathy. Upper chest: No apical lung mass. Review of the MIP images confirms the above findings CTA HEAD FINDINGS Anterior circulation: Intracranial internal carotid arteries patent. Anterior and middle cerebral arteries are patent. Posterior circulation: Intracranial vertebral arteries, basilar artery and posterior cerebral arteries are patent. Venous sinuses: As permitted by contrast timing, patent. Review of the MIP images confirms the above findings CT Brain Perfusion Findings: CBF (<30%) Volume: 82mL Perfusion (Tmax>6.0s) volume: 66mL Mismatch Volume: 64mL Infarction Location: None. IMPRESSION: No large vessel occlusion or hemodynamically significant stenosis. Perfusion imaging demonstrates no evidence of core infarction or territory at risk. These results were called by telephone at the time of interpretation on 01/14/2020 at 11:27 am to provider Dr. Stark Jock, Who verbally acknowledged these results. Electronically Signed   By: Macy Mis M.D.   On: 01/14/2020 11:27   VAS [place] TRANSCRANIAL DOPPLER W BUBBLES  Result Date: 01/16/2020  Transcranial Doppler with Bubble Indications: Stroke. Performing Technologist: Abram Sander RVS  Examination Guidelines: A complete evaluation includes B-mode imaging, spectral Doppler, color Doppler, and power Doppler as needed of all accessible portions of each vessel. Bilateral testing is considered an integral part of a complete examination. Limited examinations for reoccurring indications may be performed as noted.  Summary: No HITS at rest or during Valsalva. Negative transcranial Doppler Bubble study with no evidence of right to left intracardiac communication.  A vascular evaluation was performed. The right middle cerebral artery was studied. An IV  was inserted into the patient's left forearm. Verbal informed consent was obtained.  *See table(s) above for TCD measurements and observations.    Preliminary    ECHOCARDIOGRAM COMPLETE  Result Date: 01/15/2020    ECHOCARDIOGRAM REPORT   Patient Name:   John Watson Wold Date of Exam: 01/15/2020 Medical Rec #:  [id number]        Height:       71.0 in Accession #:    [id number]       Weight:       198.1 lb Date of Birth:  26-Jun-1960        BSA:          2.100 m Patient Age:    59 years         BP:           123/75 mmHg Patient Gender: M                HR:  74 bpm. Exam Location:  Inpatient Procedure: 2D Echo, Color Doppler and Cardiac Doppler Indications:    Stroke i163.9  History:        Patient has no prior history of Echocardiogram examinations.  Sonographer:    Raquel Sarna Senior RDCS Referring Phys: Roland  1. Left ventricular ejection fraction, by estimation, is 65 to 70%. The left ventricle has normal function. The left ventricle has no regional wall motion abnormalities. There is moderate asymmetric left ventricular hypertrophy of the basal-septal segment. Left ventricular diastolic parameters were normal.  2. Right ventricular systolic function is normal. The right ventricular size is normal. Tricuspid regurgitation signal is inadequate for assessing PA pressure.  3. The mitral valve is normal in structure. No evidence of mitral valve regurgitation.  4. The aortic valve is tricuspid. Thickened leaflets. Aortic valve regurgitation is not visualized. Mild aortic valve sclerosis is present, with no evidence of aortic valve stenosis.  5. The interatrial septum was not well visualized Conclusion(s)/Recommendation(s): No clear source of embolism seen. Consider TEE if clinically indicated. FINDINGS  Left Ventricle: Left ventricular ejection fraction, by estimation, is 65 to 70%. The left ventricle has normal function. The left ventricle has no regional wall motion abnormalities. The left  ventricular internal cavity size was normal in size. There is  moderate asymmetric left ventricular hypertrophy of the basal-septal segment. Left ventricular diastolic parameters were normal. Right Ventricle: The right ventricular size is normal. Right vetricular wall thickness was not assessed. Right ventricular systolic function is normal. Tricuspid regurgitation signal is inadequate for assessing PA pressure. Left Atrium: Left atrial size was normal in size. Right Atrium: Right atrial size was normal in size. Pericardium: There is no evidence of pericardial effusion. Mitral Valve: The mitral valve is normal in structure. No evidence of mitral valve regurgitation. Tricuspid Valve: The tricuspid valve is normal in structure. Tricuspid valve regurgitation is trivial. Aortic Valve: The aortic valve is tricuspid. Aortic valve regurgitation is not visualized. Mild aortic valve sclerosis is present, with no evidence of aortic valve stenosis. Pulmonic Valve: The pulmonic valve was grossly normal. Pulmonic valve regurgitation is not visualized. Aorta: The aortic root and ascending aorta are structurally normal, with no evidence of dilitation. IAS/Shunts: The interatrial septum was not well visualized.  LEFT VENTRICLE PLAX 2D LVIDd:         4.10 cm  Diastology LVIDs:         2.40 cm  LV e' lateral:   8.92 cm/s LV PW:         1.20 cm  LV E/e' lateral: 10.6 LV IVS:        1.20 cm  LV e' medial:    8.49 cm/s LVOT diam:     2.20 cm  LV E/e' medial:  11.1 LV SV:         79 LV SV Index:   38 LVOT Area:     3.80 cm  RIGHT VENTRICLE RV S prime:     15.70 cm/s TAPSE (M-mode): 2.4 cm LEFT ATRIUM             Index       RIGHT ATRIUM           Index LA diam:        3.40 cm 1.62 cm/m  RA Area:     13.60 cm LA Vol (A2C):   49.5 ml 23.57 ml/m RA Volume:   30.60 ml  14.57 ml/m LA Vol (A4C):   27.2 ml 12.95  ml/m LA Biplane Vol: 37.9 ml 18.05 ml/m  AORTIC VALVE LVOT Vmax:   104.00 cm/s LVOT Vmean:  71.600 cm/s LVOT VTI:    0.209 m   AORTA Ao Root diam: 3.40 cm Ao Asc diam:  3.50 cm MITRAL VALVE MV Area (PHT): 4.15 cm    SHUNTS MV Decel Time: 183 msec    Systemic VTI:  0.21 m MV E velocity: 94.30 cm/s  Systemic Diam: 2.20 cm MV A velocity: 60.80 cm/s MV E/A ratio:  1.55 [name] MD Electronically signed by [name] MD Signature Date/Time: 01/15/2020/11:05:03 AM    Final    CT HEAD CODE STROKE WO CONTRAST  Result Date: 01/14/2020 CLINICAL DATA:  Code stroke. EXAM: CT HEAD WITHOUT CONTRAST TECHNIQUE: Contiguous axial images were obtained from the base of the skull through the vertex without intravenous contrast. COMPARISON:  None. FINDINGS: Brain: No acute intracranial hemorrhage, mass effect, or edema. Gray-white differentiation is preserved. Minimal patchy hypoattenuation in the supratentorial white matter, for example in the right parietal subcortical region. Vascular: No hyperdense vessel or unexpected calcification. Skull: Unremarkable. Sinuses/Orbits: No acute finding. Other: Mastoid air cells are clear. ASPECTS ([place] Stroke Program Early CT Score) - Ganglionic level infarction (caudate, lentiform nuclei, internal capsule, insula, M1-M3 cortex): 7 - Supraganglionic infarction (M4-M6 cortex): 3 Total score (0-10 with 10 being normal): 10 IMPRESSION: No acute intracranial hemorrhage or evidence of acute infarction. ASPECT score is 10. Minimal patchy hypoattenuation in the supratentorial white matter may reflect nonspecific gliosis/demyelination. Focus in the right parietal subcortical white matter could also reflect an area of recent infarction. These results were called by telephone at the time of interpretation on 01/14/2020 at 8:48 am to provider Veryl Watson , who verbally acknowledged these results. Electronically Signed   By: Macy Mis M.D.   On: 01/14/2020 08:53   CT ANGIO HEAD CODE STROKE  Result Date: 01/14/2020 CLINICAL DATA:  Code stroke follow-up, left-sided weakness EXAM: CT ANGIOGRAPHY HEAD  AND NECK CT PERFUSION BRAIN TECHNIQUE: Multidetector CT imaging of the head and neck was performed using the standard protocol during bolus administration of intravenous contrast. Multiplanar CT image reconstructions and MIPs were obtained to evaluate the vascular anatomy. Carotid stenosis measurements (when applicable) are obtained utilizing NASCET criteria, using the distal internal carotid diameter as the denominator. Multiphase CT imaging of the brain was performed following IV bolus contrast injection. Subsequent parametric perfusion maps were calculated using RAPID software. CONTRAST:  152mL OMNIPAQUE IOHEXOL 350 MG/ML SOLN COMPARISON:  None. FINDINGS: CTA NECK FINDINGS Aortic arch: Great vessel origins are patent. Right carotid system: Patent. No measurable stenosis at the ICA origin. Left carotid system: Patent. Minimal calcified plaque at the ICA origin without measurable stenosis. Vertebral arteries: Patent and codominant. Skeleton: Degenerative changes of the cervical spine Other neck: No mass or adenopathy. Upper chest: No apical lung mass. Review of the MIP images confirms the above findings CTA HEAD FINDINGS Anterior circulation: Intracranial internal carotid arteries patent. Anterior and middle cerebral arteries are patent. Posterior circulation: Intracranial vertebral arteries, basilar artery and posterior cerebral arteries are patent. Venous sinuses: As permitted by contrast timing, patent. Review of the MIP images confirms the above findings CT Brain Perfusion Findings: CBF (<30%) Volume: 29mL Perfusion (Tmax>6.0s) volume: 22mL Mismatch Volume: 70mL Infarction Location: None. IMPRESSION: No large vessel occlusion or hemodynamically significant stenosis. Perfusion imaging demonstrates no evidence of core infarction or territory at risk. These results were called by telephone at the time of interpretation on 01/14/2020 at 11:27 am to  provider Dr. Stark Jock, Who verbally acknowledged these results.  Electronically Signed   By: Macy Mis M.D.   On: 01/14/2020 11:27   CT ANGIO NECK CODE STROKE  Result Date: 01/14/2020 CLINICAL DATA:  Code stroke follow-up, left-sided weakness EXAM: CT ANGIOGRAPHY HEAD AND NECK CT PERFUSION BRAIN TECHNIQUE: Multidetector CT imaging of the head and neck was performed using the standard protocol during bolus administration of intravenous contrast. Multiplanar CT image reconstructions and MIPs were obtained to evaluate the vascular anatomy. Carotid stenosis measurements (when applicable) are obtained utilizing NASCET criteria, using the distal internal carotid diameter as the denominator. Multiphase CT imaging of the brain was performed following IV bolus contrast injection. Subsequent parametric perfusion maps were calculated using RAPID software. CONTRAST:  143mL OMNIPAQUE IOHEXOL 350 MG/ML SOLN COMPARISON:  None. FINDINGS: CTA NECK FINDINGS Aortic arch: Great vessel origins are patent. Right carotid system: Patent. No measurable stenosis at the ICA origin. Left carotid system: Patent. Minimal calcified plaque at the ICA origin without measurable stenosis. Vertebral arteries: Patent and codominant. Skeleton: Degenerative changes of the cervical spine Other neck: No mass or adenopathy. Upper chest: No apical lung mass. Review of the MIP images confirms the above findings CTA HEAD FINDINGS Anterior circulation: Intracranial internal carotid arteries patent. Anterior and middle cerebral arteries are patent. Posterior circulation: Intracranial vertebral arteries, basilar artery and posterior cerebral arteries are patent. Venous sinuses: As permitted by contrast timing, patent. Review of the MIP images confirms the above findings CT Brain Perfusion Findings: CBF (<30%) Volume: 87mL Perfusion (Tmax>6.0s) volume: 53mL Mismatch Volume: 87mL Infarction Location: None. IMPRESSION: No large vessel occlusion or hemodynamically significant stenosis. Perfusion imaging demonstrates no  evidence of core infarction or territory at risk. These results were called by telephone at the time of interpretation on 01/14/2020 at 11:27 am to provider Dr. Stark Jock, Who verbally acknowledged these results. Electronically Signed   By: Macy Mis M.D.   On: 01/14/2020 11:27    12-lead ECG NSR at 91 bpm (personally reviewed) All prior EKG's in EPIC reviewed with no documented atrial fibrillation  Telemetry NSR 60-70s (personally reviewed)  Assessment and Plan:  1. Cryptogenic stroke The patient presents with cryptogenic stroke.  The patient does have a TEE planned for today.  I spoke at length with the patient about monitoring for afib with an implantable loop recorder.  Risks, benefits, and alteratives to implantable loop recorder were discussed with the patient today.   At this time, the patient is very clear in their decision to proceed with implantable loop recorder if not contraindicated by further work up.   Wound care was reviewed with the patient (keep incision clean and dry for 3 days).  Wound check to be scheduled and entered in AVS pending implantation. Please call with questions.   Shirley Friar, PA-C 01/17/2020 7:56 AM

## 2020-01-17 NOTE — Transfer of Care (Signed)
Immediate Anesthesia Transfer of Care Note  Patient: John Watson  Procedure(s) Performed: TRANSESOPHAGEAL ECHOCARDIOGRAM (TEE) (N/A ) BUBBLE STUDY  Patient Location: PACU and Endoscopy Unit  Anesthesia Type:MAC  Level of Consciousness: patient cooperative and responds to stimulation  Airway & Oxygen Therapy: Patient Spontanous Breathing  Post-op Assessment: Report given to RN and Post -op Vital signs reviewed and stable  Post vital signs: Reviewed and stable  Last Vitals:  Vitals Value Taken Time  BP 121/80 01/17/20 1149  Temp    Pulse 90 01/17/20 1151  Resp 20 01/17/20 1151  SpO2 99 % 01/17/20 1151  Vitals shown include unvalidated device data.  Last Pain:  Vitals:   01/17/20 1030  TempSrc: Oral  PainSc: 0-No pain         Complications: No complications documented.

## 2020-01-17 NOTE — Interval H&P Note (Signed)
History and Physical Interval Note:  01/17/2020 11:07 AM  John Watson  has presented today for surgery, with the diagnosis of STROKE.  The various methods of treatment have been discussed with the patient and family. After consideration of risks, benefits and other options for treatment, the patient has consented to  Procedure(s): TRANSESOPHAGEAL ECHOCARDIOGRAM (TEE) (N/A) as a surgical intervention.  The patient's history has been reviewed, patient examined, no change in status, stable for surgery.  I have reviewed the patient's chart and labs.  Questions were answered to the patient's satisfaction.     [name]

## 2020-01-17 NOTE — Progress Notes (Signed)
  Echocardiogram Echocardiogram Transesophageal has been performed.  John Watson 01/17/2020, 12:03 PM

## 2020-01-17 NOTE — Progress Notes (Signed)
Loop recorder insertion complete, pt may return to floor, drsg to left chest wall intact, no drainage noted

## 2020-01-17 NOTE — Progress Notes (Signed)
Pt has been discharged via wheelchair from the unit. All IV and tele has been removed. AVS documentation has been given and reviewed. Pt has no reports of pain and has all belongings sent with them. Patient sent in good spirits.

## 2020-01-17 NOTE — Progress Notes (Signed)
OT Cancellation Note  Patient Details Name: John Watson MRN: [id number] DOB: 10/07/1960   Cancelled Treatment:    Reason Eval/Treat Not Completed: Patient at procedure or test/ unavailable- pt off unit. Will follow and see as able.   Jolaine Artist, OT Acute Rehabilitation Services Pager [phone] Office [phone]   [name] 01/17/2020, 11:05 AM

## 2020-01-17 NOTE — Anesthesia Procedure Notes (Signed)
Procedure Name: MAC Date/Time: 01/17/2020 11:10 AM Performed by: Leonor Liv, CRNA Pre-anesthesia Checklist: Patient identified, Emergency Drugs available and Suction available Oxygen Delivery Method: Nasal cannula Preoxygenation: Pre-oxygenation with 100% oxygen Airway Equipment and Method: Bite block Placement Confirmation: positive ETCO2

## 2020-01-17 NOTE — Plan of Care (Signed)
Patient stable, discussed POC with patient, agreeable with plan. Discussed stroke education via stroke handbook via teach back, verbalizes understanding, denies question/concerns at this time.   

## 2020-01-17 NOTE — Plan of Care (Signed)

## 2020-01-17 NOTE — Discharge Instructions (Signed)

## 2020-01-17 NOTE — Progress Notes (Signed)
PT Cancellation Note  Patient Details Name: John Watson MRN: [id number] DOB: 05-Aug-1960   Cancelled Treatment:    Reason Eval/Treat Not Completed: Patient at procedure or test/unavailable (pt off unit. Will follow at later date/time and see as schedule allows.)  Gwynneth Albright PT, DPT Acute Rehabilitation Services  Office [phone] Pager [phone]  01/17/2020 11:17 AM

## 2020-01-17 NOTE — Anesthesia Preprocedure Evaluation (Addendum)
Anesthesia Evaluation  Patient identified by MRN, date of birth, ID band Patient awake    Reviewed: Allergy & Precautions, H&P , NPO status , Patient's Chart, lab work & pertinent test results  Airway Mallampati: I  TM Distance: >3 FB Neck ROM: Full    Dental no notable dental hx. (+) Teeth Intact, Dental Advisory Given   Pulmonary neg pulmonary ROS, former smoker,    Pulmonary exam normal breath sounds clear to auscultation       Cardiovascular negative cardio ROS   Rhythm:Regular Rate:Normal     Neuro/Psych CVA, No Residual Symptoms negative psych ROS   GI/Hepatic negative GI ROS, Neg liver ROS,   Endo/Other  negative endocrine ROS  Renal/GU negative Renal ROS  negative genitourinary   Musculoskeletal   Abdominal   Peds  Hematology negative hematology ROS (+)   Anesthesia Other Findings   Reproductive/Obstetrics negative OB ROS                            Anesthesia Physical Anesthesia Plan  ASA: II  Anesthesia Plan: MAC   Post-op Pain Management:    Induction: Intravenous  PONV Risk Score and Plan: 1 and Propofol infusion  Airway Management Planned: Nasal Cannula  Additional Equipment:   Intra-op Plan:   Post-operative Plan:   Informed Consent: I have reviewed the patients History and Physical, chart, labs and discussed the procedure including the risks, benefits and alternatives for the proposed anesthesia with the patient or authorized representative who has indicated his/her understanding and acceptance.     Dental advisory given  Plan Discussed with: CRNA  Anesthesia Plan Comments:         Anesthesia Quick Evaluation

## 2020-01-17 NOTE — Anesthesia Postprocedure Evaluation (Signed)
Anesthesia Post Note  Patient: John Watson  Procedure(s) Performed: TRANSESOPHAGEAL ECHOCARDIOGRAM (TEE) (N/A ) BUBBLE STUDY     Patient location during evaluation: PACU Anesthesia Type: MAC Level of consciousness: awake and alert Pain management: pain level controlled Vital Signs Assessment: post-procedure vital signs reviewed and stable Respiratory status: spontaneous breathing, nonlabored ventilation and respiratory function stable Cardiovascular status: stable and blood pressure returned to baseline Postop Assessment: no apparent nausea or vomiting Anesthetic complications: no   No complications documented.  Last Vitals:  Vitals:   01/17/20 1230 01/17/20 1310  BP: (!) 160/84 (!) 165/81  Pulse: 76 90  Resp: 13 18  Temp:    SpO2: 100% 98%    Last Pain:  Vitals:   01/17/20 1310  TempSrc:   PainSc: 0-No pain                 Joshua Zeringue,W. EDMOND

## 2020-01-17 NOTE — Progress Notes (Signed)
The chaplain responded to support the patient. The patient expressed gratitude for the visit and was in very high spirits. The chaplain does not see a need to follow-up.  Brion Aliment Chaplain Resident For questions concerning this note please contact me by pager [phone]

## 2020-01-18 ENCOUNTER — Encounter (HOSPITAL_COMMUNITY): Payer: Self-pay | Admitting: Cardiology

## 2020-01-18 ENCOUNTER — Other Ambulatory Visit: Payer: Self-pay | Admitting: Nurse Practitioner

## 2020-01-18 DIAGNOSIS — I639 Cerebral infarction, unspecified: Secondary | ICD-10-CM

## 2020-01-23 NOTE — Progress Notes (Signed)
Ct code stroke times 0827 call time 0828 beeper [phone] exam started 0839 exam finished 0839 images sent to soc 0840 exam completed in epic [phone] radiologist called

## 2020-01-24 ENCOUNTER — Telehealth: Payer: Self-pay | Admitting: Neurology

## 2020-01-24 NOTE — Telephone Encounter (Signed)
LVM regarding payment needed for FMLA form. gb

## 2020-01-28 ENCOUNTER — Ambulatory Visit (HOSPITAL_COMMUNITY): Payer: Self-pay | Attending: Neurology | Admitting: Physical Therapy

## 2020-01-28 ENCOUNTER — Encounter (HOSPITAL_COMMUNITY): Payer: Self-pay | Admitting: Physical Therapy

## 2020-01-28 ENCOUNTER — Other Ambulatory Visit: Payer: Self-pay

## 2020-01-28 DIAGNOSIS — R29818 Other symptoms and signs involving the nervous system: Secondary | ICD-10-CM | POA: Insufficient documentation

## 2020-01-28 DIAGNOSIS — R2689 Other abnormalities of gait and mobility: Secondary | ICD-10-CM | POA: Insufficient documentation

## 2020-01-28 NOTE — Therapy (Signed)
[facility] [address] [place], [place], [address] Phone: [phone]   Fax:  [phone]  Physical Therapy Evaluation  Patient Details  Name: John Watson MRN: [id number] Date of Birth: 02-18-1961 Referring Provider (PT): Antony Contras MD   Encounter Date: 01/28/2020   PT End of Session - 01/28/20 1420    Visit Number 1    Number of Visits 1    Date for PT Re-Evaluation 01/28/20    Authorization Type Self Pay    PT Start Time 1351    PT Stop Time 1415    PT Time Calculation (min) 24 min    Activity Tolerance Patient tolerated treatment well    Behavior During Therapy [facility] for tasks assessed/performed           Past Medical History:  Diagnosis Date  . Stroke [facility])     Past Surgical History:  Procedure Laterality Date  . BUBBLE STUDY  01/17/2020   Procedure: BUBBLE STUDY;  Surgeon: [name], MD;  Location: [facility] ENDOSCOPY;  Service: Cardiovascular;;  . LOOP RECORDER INSERTION N/A 01/17/2020   Procedure: LOOP RECORDER INSERTION;  Surgeon: [name], MD;  Location: [facility] [place] CV LAB;  Service: Cardiovascular;  Laterality: N/A;  . TEE WITHOUT CARDIOVERSION N/A 01/17/2020   Procedure: TRANSESOPHAGEAL ECHOCARDIOGRAM (TEE);  Surgeon: [name], MD;  Location: [facility] ENDOSCOPY;  Service: Cardiovascular;  Laterality: N/A;    There were no vitals filed for this visit.    Subjective Assessment - 01/28/20 1351    Subjective Patient is a 59 y.o. male who presents to physical therapy with referral for brain lesion. He states they are not sure why he had a stroke 8/16. He states his center of gravity feels a little off. He has not had any falls. He feels that his balance is slightly off. He notes unsteadiness upon initial standing. His main goal is to figure out how to get his center of gravity back and to improve steadiness feeling.    Patient Stated Goals figure out how to get his center of gravity back  and to improve steadiness feeling    Currently in Pain? No/denies              [facility] PT Assessment - 01/28/20 0001      Assessment   Medical Diagnosis Brain Lesion/ balance    Referring Provider (PT) Antony Contras MD    Onset Date/Surgical Date 01/14/20    Next MD Visit Thursday    Prior Therapy no      Precautions   Precautions None      Restrictions   Weight Bearing Restrictions No      Balance Screen   Has the patient fallen in the past 6 months No    Has the patient had a decrease in activity level because of a fear of falling?  No    Is the patient reluctant to leave their home because of a fear of falling?  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[employer], walking      Cognition   Overall Cognitive Status Within Functional Limits for tasks assessed      Observation/Other Assessments   Observations Ambulates without AD    Focus on Therapeutic Outcomes (FOTO)  not completed      Sensation   Light  Touch Appears Intact    Additional Comments tingly left toes      ROM / Strength   AROM / PROM / Strength AROM;Strength      AROM   Overall AROM  Within functional limits for tasks performed      Strength   Strength Assessment Site Hip;Knee;Ankle    Right/Left Hip Right;Left    Right Hip Flexion 5/5    Left Hip Flexion 5/5    Right/Left Knee Right;Left    Right Knee Flexion 5/5    Right Knee Extension 5/5    Left Knee Flexion 5/5    Left Knee Extension 5/5    Right/Left Ankle Right;Left    Right Ankle Dorsiflexion 5/5    Left Ankle Dorsiflexion 5/5      Transfers   Comments without UE use      Ambulation/Gait   Ambulation/Gait Yes    Ambulation/Gait Assistance 7: Independent    Ambulation Distance (Feet) 100 Feet    Assistive device None    Gait Pattern Within Functional Limits    Ambulation Surface Level;Indoor    Stairs Yes    Stairs  Assistance 7: Independent    Stair Management Technique Alternating pattern    Number of Stairs 4    Height of Stairs 7    Gait Comments without UE support      Balance   Balance Assessed Yes      Static Standing Balance   Static Standing Balance -  Activities  Single Leg Stance - Right Leg;Single Leg Stance - Left Leg    Static Standing - Comment/# of Minutes 30 sec L 10 seconds right      Standardized Balance Assessment   Standardized Balance Assessment Dynamic Gait Index      Dynamic Gait Index   Level Surface Normal    Change in Gait Speed Normal    Gait with Horizontal Head Turns Normal    Gait with Vertical Head Turns Normal    Gait and Pivot Turn Normal    Step Over Obstacle Normal    Step Around Obstacles Normal    Steps Normal    Total Score 24    DGI comment: with ease                  Vestibular Assessment - 01/28/20 0001      Oculomotor Exam   Oculomotor Alignment Normal    Spontaneous Absent    Gaze-induced  Absent    Smooth Pursuits Intact    Saccades Intact              Objective measurements completed on examination: See above findings.               PT Education - 01/28/20 1356    Education Details Patient educated on exam findings, POC, scope of PT, returning to PT if needed    Person(s) Educated Patient    Methods Explanation;Demonstration    Comprehension Verbalized understanding;Returned demonstration            PT Short Term Goals - 01/28/20 1531      PT SHORT TERM GOAL #1   Title Patient will be educated on exam findings and POC.    Time 1    Period Days    Status New    Target Date 01/28/20                     Plan - 01/28/20 1421  Clinical Impression Statement Patient is a 59 y.o. male who presents to physical therapy with referral for brain lesion and recent stroke on 8/16. Patient showing good strength, dynamic and static balance, and functional mobility. Patient with slightly decreased  sensation in L foot. Patient continues to state L toe and arch numbness as well as "feeling off" and feeling his center of gravity is off. Patient showing no vestibular impairment today with measures assessed. Patient educated on remaining active and returning to physical therapy if needed. No additional physical therapy services required at this time.    Personal Factors and Comorbidities Comorbidity 2    Comorbidities Stroke, brain lesion    Examination-Participation Restrictions Occupation;Yard Work    Stability/Clinical Decision Making Stable/Uncomplicated    Clinical Decision Making Low    Rehab Potential Good    PT Frequency One time visit    PT Treatment/Interventions Gait training;Stair training;Functional mobility training;Therapeutic activities;Therapeutic exercise;Balance training;DME Instruction;ADLs/Self Care Home Management;Neuromuscular re-education;Patient/family education    PT Next Visit Plan n/a    PT Home Exercise Plan n/a           Patient will benefit from skilled therapeutic intervention in order to improve the following deficits and impairments:  Decreased balance, Decreased mobility, Impaired sensation, Impaired vision/preception  Visit Diagnosis: Other abnormalities of gait and mobility  Other symptoms and signs involving the nervous system     Problem List Patient Active Problem List   Diagnosis Date Noted  . Seizures ([place]) 01/16/2020  . Hyperlipidemia LDL goal <70 01/16/2020  . Tobacco use disorder 01/16/2020  . 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

## 2020-01-31 ENCOUNTER — Ambulatory Visit (INDEPENDENT_AMBULATORY_CARE_PROVIDER_SITE_OTHER): Payer: Self-pay | Admitting: Emergency Medicine

## 2020-01-31 ENCOUNTER — Other Ambulatory Visit: Payer: Self-pay

## 2020-01-31 DIAGNOSIS — I639 Cerebral infarction, unspecified: Secondary | ICD-10-CM

## 2020-01-31 LAB — CUP PACEART INCLINIC DEVICE CHECK
Date Time Interrogation Session: 20210902101132
Implantable Pulse Generator Implant Date: 20210819

## 2020-01-31 NOTE — Progress Notes (Signed)
ILR wound check in clinic. Steri strips removed prior to appointment by patient Wound well healed. Home monitor transmitting nightly. No episodes. Questions answered.

## 2020-02-01 ENCOUNTER — Encounter (HOSPITAL_COMMUNITY): Payer: Self-pay | Admitting: *Deleted

## 2020-02-01 ENCOUNTER — Emergency Department (HOSPITAL_COMMUNITY): Payer: Self-pay

## 2020-02-01 ENCOUNTER — Other Ambulatory Visit: Payer: Self-pay

## 2020-02-01 ENCOUNTER — Observation Stay (HOSPITAL_COMMUNITY)
Admission: EM | Admit: 2020-02-01 | Discharge: 2020-02-02 | Disposition: A | Discharge status: Home / Self Care | Payer: Self-pay | Attending: Internal Medicine | Admitting: Internal Medicine

## 2020-02-01 DIAGNOSIS — Z7982 Long term (current) use of aspirin: Secondary | ICD-10-CM | POA: Insufficient documentation

## 2020-02-01 DIAGNOSIS — C711 Malignant neoplasm of frontal lobe: Secondary | ICD-10-CM | POA: Diagnosis present

## 2020-02-01 DIAGNOSIS — Z20822 Contact with and (suspected) exposure to covid-19: Secondary | ICD-10-CM | POA: Insufficient documentation

## 2020-02-01 DIAGNOSIS — E785 Hyperlipidemia, unspecified: Secondary | ICD-10-CM | POA: Insufficient documentation

## 2020-02-01 DIAGNOSIS — Z79899 Other long term (current) drug therapy: Secondary | ICD-10-CM | POA: Insufficient documentation

## 2020-02-01 DIAGNOSIS — Z7901 Long term (current) use of anticoagulants: Secondary | ICD-10-CM | POA: Insufficient documentation

## 2020-02-01 DIAGNOSIS — R569 Unspecified convulsions: Secondary | ICD-10-CM | POA: Insufficient documentation

## 2020-02-01 DIAGNOSIS — G40909 Epilepsy, unspecified, not intractable, without status epilepticus: Secondary | ICD-10-CM

## 2020-02-01 DIAGNOSIS — Z87891 Personal history of nicotine dependence: Secondary | ICD-10-CM | POA: Insufficient documentation

## 2020-02-01 DIAGNOSIS — C719 Malignant neoplasm of brain, unspecified: Principal | ICD-10-CM | POA: Insufficient documentation

## 2020-02-01 DIAGNOSIS — G9389 Other specified disorders of brain: Secondary | ICD-10-CM

## 2020-02-01 LAB — BASIC METABOLIC PANEL
Anion gap: 9 (ref 5–15)
BUN: 10 mg/dL (ref 6–20)
CO2: 26 mmol/L (ref 22–32)
Calcium: 10 mg/dL (ref 8.9–10.3)
Chloride: 105 mmol/L (ref 98–111)
Creatinine, Ser: 1.28 mg/dL — ABNORMAL HIGH (ref 0.61–1.24)
GFR calc Af Amer: >60 mL/min (ref >60)
GFR calc non Af Amer: >60 mL/min (ref >60)
Glucose, Bld: 102 mg/dL — ABNORMAL HIGH (ref 70–99)
Potassium: 3.9 mmol/L (ref 3.5–5.1)
Sodium: 140 mmol/L (ref 135–145)

## 2020-02-01 LAB — CBC
HCT: 39.9 % (ref 39.0–52.0)
Hemoglobin: 12.8 g/dL — ABNORMAL LOW (ref 13.0–17.0)
MCH: 29.6 pg (ref 26.0–34.0)
MCHC: 32.1 g/dL (ref 30.0–36.0)
MCV: 92.1 fL (ref 80.0–100.0)
Platelets: 352 10*3/uL (ref 150–400)
RBC: 4.33 MIL/uL (ref 4.22–5.81)
RDW: 11.4 % — ABNORMAL LOW (ref 11.5–15.5)
WBC: 5.2 10*3/uL (ref 4.0–10.5)
nRBC: 0 % (ref 0.0–0.2)

## 2020-02-01 LAB — URINALYSIS, ROUTINE W REFLEX MICROSCOPIC
Bilirubin Urine: NEGATIVE
Glucose, UA: NEGATIVE mg/dL
Ketones, ur: NEGATIVE mg/dL
Leukocytes,Ua: NEGATIVE
Nitrite: NEGATIVE
Protein, ur: NEGATIVE mg/dL
Specific Gravity, Urine: 1.003 — ABNORMAL LOW (ref 1.005–1.030)
pH: 8 (ref 5.0–8.0)

## 2020-02-01 LAB — CBG MONITORING, ED: Glucose-Capillary: 96 mg/dL (ref 70–99)

## 2020-02-01 LAB — SARS CORONAVIRUS 2 BY RT PCR (HOSPITAL ORDER, PERFORMED IN ~~LOC~~ HOSPITAL LAB): SARS Coronavirus 2: NEGATIVE

## 2020-02-01 MED ORDER — ONDANSETRON HCL 4 MG PO TABS: 4.0000 mg | ORAL_TABLET | Freq: Four times a day (QID) | ORAL | Status: DC | PRN

## 2020-02-01 MED ORDER — ONDANSETRON HCL 4 MG/2ML IJ SOLN: 4.0000 mg | Freq: Four times a day (QID) | INTRAMUSCULAR | Status: DC | PRN

## 2020-02-01 MED ORDER — GADOBUTROL 1 MMOL/ML IV SOLN
9.0000 mL | Freq: Once | INTRAVENOUS | Status: AC | PRN
  Administered 2020-02-01: 9 mL via INTRAVENOUS

## 2020-02-01 MED ORDER — LEVETIRACETAM 750 MG PO TABS
750.0000 mg | ORAL_TABLET | Freq: Two times a day (BID) | ORAL | Status: DC
  Administered 2020-02-01 – 2020-02-02 (×2): 750 mg via ORAL
  Filled 2020-02-01 (×3): qty 1

## 2020-02-01 MED ORDER — ATORVASTATIN CALCIUM 80 MG PO TABS
80.0000 mg | ORAL_TABLET | Freq: Every day | ORAL | Status: DC
  Administered 2020-02-01 – 2020-02-02 (×2): 80 mg via ORAL
  Filled 2020-02-01 (×2): qty 1

## 2020-02-01 MED ORDER — ACETAMINOPHEN 325 MG PO TABS
650.0000 mg | ORAL_TABLET | Freq: Four times a day (QID) | ORAL | Status: DC | PRN
  Administered 2020-02-01 – 2020-02-02 (×2): 650 mg via ORAL
  Filled 2020-02-01 (×2): qty 2

## 2020-02-01 MED ORDER — ACETAMINOPHEN 650 MG RE SUPP: 650.0000 mg | Freq: Four times a day (QID) | RECTAL | Status: DC | PRN

## 2020-02-01 MED ORDER — SODIUM CHLORIDE 0.9 % IV SOLN: INTRAVENOUS | Status: AC

## 2020-02-01 NOTE — ED Provider Notes (Signed)
[facility] EMERGENCY DEPARTMENT Provider Note   CSN: [id number] Arrival date & time: 02/01/20  3664     History Chief Complaint  Patient presents with  . Weakness  . Stroke Symptoms    John Watson is a 59 y.o. male presenting for evaluation of left-sided weakness and shaking of the left arm.  Patient states at 630 this morning he had acute onset left leg and arm numbness. He has associated weakness of the LUE.  He then developed shaking/jerking movement of his left arm which lasted for 15 to 20 minutes.  He had associated dizziness, feeling like he was off balance.  He states the symptoms are similar to how he presented with a stroke last month, prompting his ER visit.  He states the numbness in his leg has completely resolved, however he continues to have an abnormal sensation of his left hand.  He reports no weakness of his left upper extremity anymore.  After his symptoms started, he then took his morning medicines including his Keppra.  He denies recent fall, trauma, or injury.  He denies recent fevers, chills, chest pain, shortness of breath, cough, nausea, vomiting, abnormal bowel movements.  He does feel he is urinating frequently, but no dysuria or hematuria.  Patient states his follow-up neurology appointment is not until November, 2 months from now.  Additional history obtained from chart review.  Reviewed patient's recent visit from 8-16-8-19.  He presented with strokelike symptoms, given TPA.  MRI showed a right frontal lesion, consider subacute infarcts versus low-grade glioma.  Additionally, patient did have slowing on his EEG, consider seizure.  HPI     Past Medical History:  Diagnosis Date  . Stroke [facility])     Patient Active Problem List   Diagnosis Date Noted  . Seizures ([place]) 01/16/2020  . Hyperlipidemia LDL goal <70 01/16/2020  . Tobacco use disorder 01/16/2020  . 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 BUBBLE STUDY  01/17/2020   Procedure: BUBBLE STUDY;  Surgeon: [name], MD;  Location: [facility] ENDOSCOPY;  Service: Cardiovascular;;  . LOOP RECORDER INSERTION N/A 01/17/2020   Procedure: LOOP RECORDER INSERTION;  Surgeon: [name], MD;  Location: [facility] CV LAB;  Service: Cardiovascular;  Laterality: N/A;  . TEE WITHOUT CARDIOVERSION N/A 01/17/2020   Procedure: TRANSESOPHAGEAL ECHOCARDIOGRAM (TEE);  Surgeon: [name], MD;  Location: [facility] ENDOSCOPY;  Service: Cardiovascular;  Laterality: N/A;       Family History  Problem Relation Age of Onset  . Hypertension Mother   . Lung cancer Father   . Hyperlipidemia Sister   . Hyperlipidemia Brother     Social History   Tobacco Use  . Smoking status: Former Smoker    Types: Cigars  . Smokeless tobacco: Never Used  Substance Use Topics  . Alcohol use: Yes    Comment: occasionally  . Drug use: No    Home Medications Prior to Admission medications   Medication Sig Start Date End Date Taking? Authorizing Provider  aspirin EC 81 MG EC tablet Take 1 tablet (81 mg total) by mouth daily. Swallow whole. 01/18/20  Yes Donzetta Starch, NP  atorvastatin (LIPITOR) 80 MG tablet Take 1 tablet (80 mg total) by mouth daily. 01/17/20  Yes Donzetta Starch, NP  clopidogrel (PLAVIX) 75 MG tablet Take 1 tablet (75 mg total) by mouth daily for 21 days.  01/18/20 02/08/20 Yes Donzetta Starch, NP  levETIRAcetam (KEPPRA) 750 MG tablet Take 1 tablet (750 mg total) by mouth 2 (two) times daily. 01/17/20  Yes Donzetta Starch, NP    Allergies    Patient has no known allergies.  Review of Systems   Review of Systems  Neurological: Positive for weakness and numbness. Seizures: jerking of L arm.  Hematological: Bruises/bleeds easily.  All other systems reviewed and are negative.   Physical Exam Updated Vital Signs BP 135/69   Pulse 75   Temp 97.8 F  (36.6 C) (Oral)   Resp 20   Ht 5\' 11"  (1.803 m)   Wt 89.8 kg   SpO2 100%   BMI 27.62 kg/m   Physical Exam Vitals and nursing note reviewed.  Constitutional:      General: He is not in acute distress.    Appearance: He is well-developed.     Comments: Resting in the bed in no acute distress  HENT:     Head: Normocephalic and atraumatic.  Eyes:     Extraocular Movements: Extraocular movements intact.     Conjunctiva/sclera: Conjunctivae normal.     Pupils: Pupils are equal, round, and reactive to light.  Cardiovascular:     Rate and Rhythm: Normal rate and regular rhythm.     Pulses: Normal pulses.  Pulmonary:     Effort: Pulmonary effort is normal. No respiratory distress.     Breath sounds: Normal breath sounds. No wheezing.  Abdominal:     General: There is no distension.     Palpations: Abdomen is soft. There is no mass.     Tenderness: There is no abdominal tenderness. There is no guarding or rebound.  Musculoskeletal:        General: Normal range of motion.     Cervical back: Normal range of motion and neck supple.     Comments: Strength and sensation intact x4.  Radial and pedal pulses 2+ bilaterally.  Skin:    General: Skin is warm and dry.     Capillary Refill: Capillary refill takes less than 2 seconds.  Neurological:     General: No focal deficit present.     Mental Status: He is alert and oriented to person, place, and time.     GCS: GCS eye subscore is 4. GCS verbal subscore is 5. GCS motor subscore is 6.     Cranial Nerves: Cranial nerves are intact.     Sensory: Sensation is intact.     Motor: Motor function is intact. No tremor, seizure activity or pronator drift.     Coordination: Coordination is intact. Finger-Nose-Finger Test and Heel to [facility] Test normal.     Comments: No tremor or jerking movement noted on exam.  No focal neurologic deficits.  CN intact.  Nose to finger intact.  Fine movement and coordination intact.  Negative pronator drift.     ED  Results / Procedures / Treatments   Labs (all labs ordered are listed, but only abnormal results are displayed) Labs Reviewed  BASIC METABOLIC PANEL - Abnormal; Notable for the following components:      Result Value   Glucose, Bld 102 (*)    Creatinine, Ser 1.28 (*)    All other components within normal limits  CBC - Abnormal; Notable for the following components:   Hemoglobin 12.8 (*)    RDW 11.4 (*)    All other components within normal limits  URINALYSIS, ROUTINE W REFLEX MICROSCOPIC - Abnormal; Notable for  the following components:   Color, Urine COLORLESS (*)    Specific Gravity, Urine 1.003 (*)    Hgb urine dipstick SMALL (*)    Bacteria, UA RARE (*)    All other components within normal limits  SARS CORONAVIRUS 2 BY RT PCR [facility] ORDER, [facility])  CBG MONITORING, ED    EKG EKG Interpretation  Date/Time:  Friday February 01 2020 07:47:47 EDT Ventricular Rate:  95 PR Interval:    QRS Duration: 78 QT Interval:  346 QTC Calculation: 435 R Axis:   -16 Text Interpretation: Sinus rhythm Borderline left axis deviation Confirmed by Dewaine Conger [phone]) on 02/01/2020 8:00:10 AM   Radiology CT Head Wo Contrast  Result Date: 02/01/2020 CLINICAL DATA:  Left-sided numbness EXAM: CT HEAD WITHOUT CONTRAST TECHNIQUE: Contiguous axial images were obtained from the base of the skull through the vertex without intravenous contrast. COMPARISON:  Recent CT and MR imaging FINDINGS: Brain: Mildly hyperdense, ill-defined lesion of the parasagittal right frontal lobe appears increased in size compared to prior CTs. The hyperdensity may reflect hypercellularity. Ill-defined hypoattenuating right parietal lesion appears unchanged. No new hemorrhage or loss of gray-white differentiation. Ventricles are stable in size. Vascular: No hyperdense vessel or unexpected calcification. Skull: Calvarium is unremarkable. Sinuses/Orbits: No acute finding. Other: None. IMPRESSION:  Increase in size of mild hyperdense parasagittal right frontal lobe lesion. Stable appearance of hypoattenuating right parietal lesion. Glioma is the most likely differential consideration at this time and the right frontal lesion is unlikely to be low-grade given rapid change. Electronically Signed   By: Macy Mis M.D.   On: 02/01/2020 09:29   MR Brain W and Wo Contrast  Result Date: 02/01/2020 CLINICAL DATA:  Left-sided weakness and seizure today. History of recent stroke. TPA 01/14/2020 EXAM: MRI HEAD WITHOUT AND WITH CONTRAST TECHNIQUE: Multiplanar, multiecho pulse sequences of the brain and surrounding structures were obtained without and with intravenous contrast. CONTRAST:  76mL GADAVIST GADOBUTROL 1 MMOL/ML IV SOLN COMPARISON:  CT head 02/01/2020. MRI head without and with contrast 01/14/2020 FINDINGS: Brain: There are 2 brain lesions. The right medial posterior frontal lesion shows progression since the prior study. The right parietal cortical lesion is unchanged Right medial frontal lesion has enlarged in the interval. There remains facilitated diffusion. This lesion has increased signal on T2 and primarily involves the cortex. Following contrast infusion, there is significant progression of enhancement and enlargement of the lesion the lesion shows irregular peripheral enhancement with small adjacent satellite lesions. There is central necrosis. No hemorrhage. Previously there was a small mildly enhancing lesion. Enhancement pattern suggestive an aggressive neoplasm. The second lesion in the right parietal cortex shows increased signal on T2 with cortical thickening but no enhancement. No change from the prior study. No new lesions identified elsewhere. Ventricle size is normal. No midline shift. No acute infarct. Mild subcortical white matter hyperintensities likely due to chronic ischemia. Small chronic infarct left cerebellum. Vascular: Normal arterial flow voids Skull and upper cervical spine:  Negative Sinuses/Orbits: Mild mucosal edema paranasal sinuses. Negative orbit Other: None IMPRESSION: Significant enlargement of the right medial frontal cortical lesion. This lesion appears aggressive with central necrosis and is most consistent with a high-grade glioma. Lymphoma considered given the hyperdensity on CT. Second lesion right parietal cortex shows no enhancement no interval change. This has the appearance of a low-grade glioma. Again lymphoma considered but not typical in appearance. Neuro surgical consultation recommended. Electronically Signed   By: Franchot Gallo M.D.  On: 02/01/2020 12:55   CUP PACEART INCLINIC DEVICE CHECK  Result Date: 01/31/2020 ILR wound check in clinic. Steri strips removed prior to appointment by patient Wound well healed. Home monitor transmitting nightly. No episodes. Questions answered. ILR wound check in clinic. Steri strips removed prior to appointment by patient Wound well healed. Home monitor transmitting nightly. No episodes. Questions answered.   Procedures Procedures (including critical care time)  Medications Ordered in ED Medications  gadobutrol (GADAVIST) 1 MMOL/ML injection 9 mL (9 mLs Intravenous Contrast Given 02/01/20 1210)    ED Course  I have reviewed the triage vital signs and the nursing notes.  Pertinent labs & imaging results that were available during my care of the patient were reviewed by me and considered in my medical decision making (see chart for details).    MDM Rules/Calculators/A&P                          Patient presenting for evaluation of left-sided numbness and weakness with associated jerking of the left upper extremity.  On exam, symptoms have almost completely resolved.  Patient reports continued abnormal sensation of the left hand, however no objective deficits.  Consider seizure.  Consider recrudescence of strokelike symptoms due to infection or electrolyte abnormality.  As patient had an abnormal MRI which  included a differential of glioma, consider mass.  Will obtain labs, urine, and consult with neurology regarding appropriate head imaging.  Ct head negative for acute findings such as bleed. Labs interpreted by me, overall reassuring. Electrolytes stable. Hemoglobin stable. Urine without obvious infection.  Discussed with Dr. Lorrin Goodell from neurology, who recommends MRI with and without. If no acute findings or acute changes, likely focal seizure and recommends patient Keppra be increased to 1000 twice daily.  Repeat MRI concerning in that patient has significant growth of the mass concerning for high-grade glioma with central necrosis.  Additionally pt with low-grade mass noted on mri. Will consult with neurosurgery.   Discussed with Dr. Reatha Armour from neurosurgery who recommends medicine admit.  Mass will likely need to be biopsied and resected.  Recommend stopping aspirin and Plavix.  Pt signed out to Carlota Raspberry, PA-C for admission call.    Final Clinical Impression(s) / ED Diagnoses Final diagnoses:  Brain mass  Seizure-like activity [facility])    Rx / DC Orders ED Discharge Orders    None       Franchot Heidelberg, PA-C 02/01/20 1516    Breck Coons, MD 02/02/20 [phone]

## 2020-02-01 NOTE — ED Triage Notes (Signed)
To ED for eval of left side weakness and 'seizure' that started this am while in bathroom. States he was just released from the hospital after having a stroke. Same symptoms. Pt states he had TPA on 8/16. No loc with 'seizure', just shaking of left arm uncontrollably. Pt is alert. Up in triage room using a urinal. No slurred speech. Left hand with noted shaking.

## 2020-02-01 NOTE — ED Provider Notes (Signed)
Discussed with Dr. Darrick Meigs (hospitalist) who accepts patient for admission.    Portions of this note were generated with [employer]. Dictation errors may occur despite best attempts at proofreading.    Volanda Napoleon, PA-C 02/01/20 1657    Breck Coons, MD 02/02/20 [phone]

## 2020-02-01 NOTE — H&P (Signed)
TRH H&P    Patient Demographics:    John Watson, is a 59 y.o. male  MRN: [id number]  DOB - July 08, 1960  Admit Date - 02/01/2020  Referring MD/NP/PA: Simonne Martinet  Outpatient Primary MD for the patient is Patient, No Pcp Per  Patient coming from: Home  Chief complaint-left side numbness/jerking movements   HPI:    John Watson  is a 59 y.o. male, with history of stroke who was recently discharged from [facility] on 01/15/2020 on aspirin and Plavix.  At that time patient received TPA for possible embolic stroke.  There was also question of low-grade glioma, neurosurgery was consulted and plan was to follow-up as outpatient.  Patient was discharged on [place] for seizure prophylaxis.  As per patient, this morning he had acute onset of weakness of left arm and leg also had numbness.  He then developed shaking/jerking movements of the left arm which lasted for about 15 to 20 minutes.  He denies any loss of consciousness.  Patient has been compliant with his Keppra.  Denies slurred speech.  No nausea vomiting or diarrhea.  No chest pain or shortness of breath.  Denies biting his tongue, no loss of bladder or bowel control. In the ED CT head was negative for intracranial bleed.  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  Additionally patient also found to have second lesion right brachial cortex which showed no enhancement, no interval change.  Consistent with appearance of low-grade glioma.  Neurosurgery was consulted by ED provider.    Review of systems:    In addition to the HPI above,    All other systems reviewed and are negative.    Past History of the following :    Past Medical History:  Diagnosis Date  . Stroke [facility])       Past Surgical History:  Procedure Laterality Date  . BUBBLE STUDY  01/17/2020   Procedure: BUBBLE STUDY;   Surgeon: [name], MD;  Location: [facility] ENDOSCOPY;  Service: Cardiovascular;;  . LOOP RECORDER INSERTION N/A 01/17/2020   Procedure: LOOP RECORDER INSERTION;  Surgeon: [name], MD;  Location: [place] CV LAB;  Service: Cardiovascular;  Laterality: N/A;  . TEE WITHOUT CARDIOVERSION N/A 01/17/2020   Procedure: TRANSESOPHAGEAL ECHOCARDIOGRAM (TEE);  Surgeon: [name], MD;  Location: [facility] ENDOSCOPY;  Service: Cardiovascular;  Laterality: N/A;      Social History:      Social History   Tobacco Use  . Smoking status: Former Smoker    Types: Cigars  . Smokeless tobacco: Never Used  Substance Use Topics  . Alcohol use: Yes    Comment: occasionally       Family History :     Family History  Problem Relation Age of Onset  . Hypertension Mother   . Lung cancer Father   . Hyperlipidemia Sister   . Hyperlipidemia Brother       Home Medications:   Prior to Admission medications   Medication Sig Start Date End  Date Taking? Authorizing Provider  aspirin EC 81 MG EC tablet Take 1 tablet (81 mg total) by mouth daily. Swallow whole. 01/18/20  Yes Donzetta Starch, NP  atorvastatin (LIPITOR) 80 MG tablet Take 1 tablet (80 mg total) by mouth daily. 01/17/20  Yes Donzetta Starch, NP  clopidogrel (PLAVIX) 75 MG tablet Take 1 tablet (75 mg total) by mouth daily for 21 days. 01/18/20 02/08/20 Yes Donzetta Starch, NP  levETIRAcetam (KEPPRA) 750 MG tablet Take 1 tablet (750 mg total) by mouth 2 (two) times daily. 01/17/20  Yes Donzetta Starch, NP     Allergies:    No Known Allergies   Physical Exam:   Vitals  Blood pressure 128/85, pulse 88, temperature 97.8 F (36.6 C), temperature source Oral, resp. rate (!) 22, height 5\' 11"  (1.803 m), weight 89.8 kg, SpO2 100 %.  1.  General: Appears in no acute distress  2. Psychiatric: Alert, oriented x3, intact insight and judgment  3. Neurologic: Cranial nerves II through XII grossly intact, motor strength 5/5  in all extremities, no focal deficit noted, sensations intact bilaterally  4. HEENMT:  Atraumatic normocephalic, extraocular muscles are intact  5. Respiratory : Clear to auscultation bilaterally, no wheezing or crackles auscultated  6. Cardiovascular : S1-S2, regular, no murmur auscultated  7. Gastrointestinal:  Abdomen soft, nontender, no organomegaly      Data Review:    CBC Recent Labs  Lab 02/01/20 0751  WBC 5.2  HGB 12.8*  HCT 39.9  PLT 352  MCV 92.1  MCH 29.6  MCHC 32.1  RDW 11.4*   ------------------------------------------------------------------------------------------------------------------  Results for orders placed or performed during the hospital encounter of 02/01/20 (from the past 48 hour(s))  Urinalysis, Routine w reflex microscopic     Status: Abnormal   Collection Time: 02/01/20  7:30 AM  Result Value Ref Range   Color, Urine COLORLESS (A) YELLOW   APPearance CLEAR CLEAR   Specific Gravity, Urine 1.003 (L) 1.005 - 1.030   pH 8.0 5.0 - 8.0   Glucose, UA NEGATIVE NEGATIVE mg/dL   Hgb urine dipstick SMALL (A) NEGATIVE   Bilirubin Urine NEGATIVE NEGATIVE   Ketones, ur NEGATIVE NEGATIVE mg/dL   Protein, ur NEGATIVE NEGATIVE mg/dL   Nitrite NEGATIVE NEGATIVE   Leukocytes,Ua NEGATIVE NEGATIVE   RBC / HPF 0-5 0 - 5 RBC/hpf   Bacteria, UA RARE (A) NONE SEEN    Comment: Performed at [facility], [address], [place], [place] [address]  Basic metabolic panel     Status: Abnormal   Collection Time: 02/01/20  7:51 AM  Result Value Ref Range   Sodium 140 135 - 145 mmol/L   Potassium 3.9 3.5 - 5.1 mmol/L   Chloride 105 98 - 111 mmol/L   CO2 26 22 - 32 mmol/L   Glucose, Bld 102 (H) 70 - 99 mg/dL    Comment: Glucose reference range applies only to samples taken after fasting for at least 8 hours.   BUN 10 6 - 20 mg/dL   Creatinine, Ser 1.28 (H) 0.61 - 1.24 mg/dL   Calcium 10.0 8.9 - 10.3 mg/dL   GFR calc non Af Amer >60 >60 mL/min   GFR  calc Af Amer >60 >60 mL/min   Anion gap 9 5 - 15    Comment: Performed at [facility] [address], [place], [place] [address]  CBC     Status: Abnormal   Collection Time: 02/01/20  7:51 AM  Result Value Ref Range   WBC 5.2 4.0 - 10.5 K/uL   RBC 4.33 4.22 - 5.81 MIL/uL   Hemoglobin 12.8 (L) 13.0 - 17.0 g/dL   HCT 39.9 39 - 52 %   MCV 92.1 80.0 - 100.0 fL   MCH 29.6 26.0 - 34.0 pg   MCHC 32.1 30.0 - 36.0 g/dL   RDW 11.4 (L) 11.5 - 15.5 %   Platelets 352 150 - 400 K/uL   nRBC 0.0 0.0 - 0.2 %    Comment: Performed at [facility] [address] [place], [place] [address]  CBG monitoring, ED     Status: None   Collection Time: 02/01/20  7:52 AM  Result Value Ref Range   Glucose-Capillary 96 70 - 99 mg/dL    Comment: Glucose reference range applies only to samples taken after fasting for at least 8 hours.  SARS Coronavirus 2 by RT PCR (hospital order, performed in [facility] hospital lab) Nasopharyngeal Nasopharyngeal Swab     Status: None   Collection Time: 02/01/20  2:52 PM   Specimen: Nasopharyngeal Swab  Result Value Ref Range   SARS Coronavirus 2 NEGATIVE NEGATIVE    Comment: (NOTE) SARS-CoV-2 target nucleic acids are NOT DETECTED.  The SARS-CoV-2 RNA is generally detectable in upper and lower respiratory specimens during the acute phase of infection. The lowest concentration of SARS-CoV-2 viral copies this assay can detect is 250 copies / mL. A negative result does not preclude SARS-CoV-2 infection and should not be used as the sole basis for treatment or other patient management decisions.  A negative result may occur with improper specimen collection / handling, submission of specimen other than nasopharyngeal swab, presence of viral mutation(s) within the areas targeted by this assay, and inadequate number of viral copies (<250 copies / mL). A negative result must be combined with clinical observations, patient history, and epidemiological  information.  Fact Sheet for Patients:   StrictlyIdeas.no  Fact Sheet for Healthcare Providers: BankingDealers.co.za  This test is not yet approved or  cleared by the [place] FDA and has been authorized for detection and/or diagnosis of SARS-CoV-2 by FDA under an Emergency Use Authorization (EUA).  This EUA will remain in effect (meaning this test can be used) for the duration of the COVID-19 declaration under Section 564(b)(1) of the Act, 21 U.S.C. section 360bbb-3(b)(1), unless the authorization is terminated or revoked sooner.  Performed at [facility], [place] [address], [place], [place] [address]     Chemistries  Recent Labs  Lab 02/01/20 0751  NA 140  K 3.9  CL 105  CO2 26  GLUCOSE 102*  BUN 10  CREATININE 1.28*  CALCIUM 10.0   ------------------------------------------------------------------------------------------------------------------  ------------------------------------------------------------------------------------------------------------------  CBG: Recent Labs  Lab 02/01/20 0752  GLUCAP 96    --------------------------------------------------------------------------------------------------------------- Urine analysis:    Component Value Date/Time   COLORURINE COLORLESS (A) 02/01/2020 0730   APPEARANCEUR CLEAR 02/01/2020 0730   LABSPEC 1.003 (L) 02/01/2020 0730   PHURINE 8.0 02/01/2020 0730   GLUCOSEU NEGATIVE 02/01/2020 0730   HGBUR SMALL (A) 02/01/2020 0730   BILIRUBINUR NEGATIVE 02/01/2020 0730   KETONESUR NEGATIVE 02/01/2020 0730   PROTEINUR NEGATIVE 02/01/2020 0730   NITRITE NEGATIVE 02/01/2020 0730   LEUKOCYTESUR NEGATIVE 02/01/2020 0730      Imaging Results:    CT Head Wo Contrast  Result Date: 02/01/2020 CLINICAL DATA:  Left-sided numbness EXAM: CT HEAD WITHOUT CONTRAST TECHNIQUE: Contiguous axial images were obtained from the base of the  skull through the vertex without  intravenous contrast. COMPARISON:  Recent CT and MR imaging FINDINGS: Brain: Mildly hyperdense, ill-defined lesion of the parasagittal right frontal lobe appears increased in size compared to prior CTs. The hyperdensity may reflect hypercellularity. Ill-defined hypoattenuating right parietal lesion appears unchanged. No new hemorrhage or loss of gray-white differentiation. Ventricles are stable in size. Vascular: No hyperdense vessel or unexpected calcification. Skull: Calvarium is unremarkable. Sinuses/Orbits: No acute finding. Other: None. IMPRESSION: Increase in size of mild hyperdense parasagittal right frontal lobe lesion. Stable appearance of hypoattenuating right parietal lesion. Glioma is the most likely differential consideration at this time and the right frontal lesion is unlikely to be low-grade given rapid change. Electronically Signed   By: Macy Mis M.D.   On: 02/01/2020 09:29   MR Brain W and Wo Contrast  Result Date: 02/01/2020 CLINICAL DATA:  Left-sided weakness and seizure today. History of recent stroke. TPA 01/14/2020 EXAM: MRI HEAD WITHOUT AND WITH CONTRAST TECHNIQUE: Multiplanar, multiecho pulse sequences of the brain and surrounding structures were obtained without and with intravenous contrast. CONTRAST:  77mL GADAVIST GADOBUTROL 1 MMOL/ML IV SOLN COMPARISON:  CT head 02/01/2020. MRI head without and with contrast 01/14/2020 FINDINGS: Brain: There are 2 brain lesions. The right medial posterior frontal lesion shows progression since the prior study. The right parietal cortical lesion is unchanged Right medial frontal lesion has enlarged in the interval. There remains facilitated diffusion. This lesion has increased signal on T2 and primarily involves the cortex. Following contrast infusion, there is significant progression of enhancement and enlargement of the lesion the lesion shows irregular peripheral enhancement with small adjacent satellite lesions. There is central necrosis. No  hemorrhage. Previously there was a small mildly enhancing lesion. Enhancement pattern suggestive an aggressive neoplasm. The second lesion in the right parietal cortex shows increased signal on T2 with cortical thickening but no enhancement. No change from the prior study. No new lesions identified elsewhere. Ventricle size is normal. No midline shift. No acute infarct. Mild subcortical white matter hyperintensities likely due to chronic ischemia. Small chronic infarct left cerebellum. Vascular: Normal arterial flow voids Skull and upper cervical spine: Negative Sinuses/Orbits: Mild mucosal edema paranasal sinuses. Negative orbit Other: None IMPRESSION: Significant enlargement of the right medial frontal cortical lesion. This lesion appears aggressive with central necrosis and is most consistent with a high-grade glioma. Lymphoma considered given the hyperdensity on CT. Second lesion right parietal cortex shows no enhancement no interval change. This has the appearance of a low-grade glioma. Again lymphoma considered but not typical in appearance. Neuro surgical consultation recommended. Electronically Signed   By: Franchot Gallo M.D.   On: 02/01/2020 12:55   CUP PACEART INCLINIC DEVICE CHECK  Result Date: 01/31/2020 ILR wound check in clinic. Steri strips removed prior to appointment by patient Wound well healed. Home monitor transmitting nightly. No episodes. Questions answered. ILR wound check in clinic. Steri strips removed prior to appointment by patient Wound well healed. Home monitor transmitting nightly. No episodes. Questions answered.   My personal review of EKG: Rhythm NSR, no ST changes   Assessment & Plan:    Active Problems:   Glioma ([place])   1. 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  Lesion appears aggressive with central necrosis most consistent with high-grade glioma.  Neurosurgery has been consulted, Dr. Reatha Armour recommends holding aspirin  and Plavix.  Patient will likely need biopsy and resection of the mass. 2. ? Seizure-patient likely had simple partial seizure secondary to  brain mass.  He did not have loss of consciousness.  He is on Keppra 750 mg p.o. twice daily.  We will continue with Keppra, seizure precautions. 3. Recent stroke-hold aspirin and Plavix as per neurosurgery recommendation.  Continue Lipitor.    DVT Prophylaxis-SCDs  AM Labs Ordered, also please review Full Orders  Family Communication: Admission, patients condition and plan of care including tests being ordered have been discussed with the patient  who indicate understanding and agree with the plan and Code Status.  Code Status: Full code  Admission status: Observation  Time spent in minutes : 60 minutes   Noriah Osgood S Shaterra Sanzone M.D

## 2020-02-01 NOTE — ED Notes (Signed)
Pt transported to MRI 

## 2020-02-02 DIAGNOSIS — E785 Hyperlipidemia, unspecified: Secondary | ICD-10-CM

## 2020-02-02 DIAGNOSIS — C719 Malignant neoplasm of brain, unspecified: Secondary | ICD-10-CM

## 2020-02-02 DIAGNOSIS — R569 Unspecified convulsions: Secondary | ICD-10-CM

## 2020-02-02 LAB — CBC
HCT: 38.6 % — ABNORMAL LOW (ref 39.0–52.0)
Hemoglobin: 12.4 g/dL — ABNORMAL LOW (ref 13.0–17.0)
MCH: 29.7 pg (ref 26.0–34.0)
MCHC: 32.1 g/dL (ref 30.0–36.0)
MCV: 92.6 fL (ref 80.0–100.0)
Platelets: 328 10*3/uL (ref 150–400)
RBC: 4.17 MIL/uL — ABNORMAL LOW (ref 4.22–5.81)
RDW: 11.4 % — ABNORMAL LOW (ref 11.5–15.5)
WBC: 5.5 10*3/uL (ref 4.0–10.5)
nRBC: 0 % (ref 0.0–0.2)

## 2020-02-02 LAB — COMPREHENSIVE METABOLIC PANEL
ALT: 17 U/L (ref 0–44)
AST: 13 U/L — ABNORMAL LOW (ref 15–41)
Albumin: 3.7 g/dL (ref 3.5–5.0)
Alkaline Phosphatase: 54 U/L (ref 38–126)
Anion gap: 9 (ref 5–15)
BUN: 10 mg/dL (ref 6–20)
CO2: 24 mmol/L (ref 22–32)
Calcium: 9.1 mg/dL (ref 8.9–10.3)
Chloride: 107 mmol/L (ref 98–111)
Creatinine, Ser: 1.08 mg/dL (ref 0.61–1.24)
GFR calc Af Amer: >60 mL/min (ref >60)
GFR calc non Af Amer: >60 mL/min (ref >60)
Glucose, Bld: 100 mg/dL — ABNORMAL HIGH (ref 70–99)
Potassium: 3.7 mmol/L (ref 3.5–5.1)
Sodium: 140 mmol/L (ref 135–145)
Total Bilirubin: 1.1 mg/dL (ref 0.3–1.2)
Total Protein: 6.5 g/dL (ref 6.5–8.1)

## 2020-02-02 MED ORDER — LEVETIRACETAM IN NACL 500 MG/100ML IV SOLN
500.0000 mg | Freq: Once | INTRAVENOUS | Status: AC
  Administered 2020-02-02: 500 mg via INTRAVENOUS
  Filled 2020-02-02: qty 100

## 2020-02-02 MED ORDER — LEVETIRACETAM 1000 MG PO TABS: 1000.0000 mg | ORAL_TABLET | Freq: Two times a day (BID) | ORAL | 0 refills | Status: DC

## 2020-02-02 MED ORDER — LORAZEPAM 2 MG/ML IJ SOLN: 2.0000 mg | INTRAMUSCULAR | Status: DC | PRN

## 2020-02-02 MED ORDER — LEVETIRACETAM 500 MG PO TABS: 1000.0000 mg | ORAL_TABLET | Freq: Two times a day (BID) | ORAL | Status: DC

## 2020-02-02 NOTE — Progress Notes (Signed)
Subjective: The patient is alert and pleasant.  He is in no apparent distress.  He wants to go home.  He has had no more seizures.  Objective: Vital signs in last 24 hours: Temp:  [97.9 F (36.6 C)-98 F (36.7 C)] 98 F (36.7 C) (09/04 0942) Pulse Rate:  [69-92] 92 (09/04 0942) Resp:  [11-22] 16 (09/04 0942) BP: (126-157)/(69-94) 131/78 (09/04 0942) SpO2:  [96 %-100 %] 99 % (09/04 0942) Estimated body mass index is 27.62 kg/m as calculated from the following:   Height as of this encounter: 5\' 11"  (1.803 m).   Weight as of this encounter: 89.8 kg.   Intake/Output from previous day: 09/03 0701 - 09/04 0700 In: 916.6 [I.V.:916.6] Out: -  Intake/Output this shift: Total I/O In: 420 [P.O.:420] Out: -   Physical exam the patient is alert and oriented.  His strength and speech are normal.  Lab Results: Recent Labs    02/01/20 0751 02/02/20 0316  WBC 5.2 5.5  HGB 12.8* 12.4*  HCT 39.9 38.6*  PLT 352 328   BMET Recent Labs    02/01/20 0751 02/02/20 0316  NA 140 140  K 3.9 3.7  CL 105 107  CO2 26 24  GLUCOSE 102* 100*  BUN 10 10  CREATININE 1.28* 1.08  CALCIUM 10.0 9.1    Studies/Results: CT Head Wo Contrast  Result Date: 02/01/2020 CLINICAL DATA:  Left-sided numbness EXAM: CT HEAD WITHOUT CONTRAST TECHNIQUE: Contiguous axial images were obtained from the base of the skull through the vertex without intravenous contrast. COMPARISON:  Recent CT and MR imaging FINDINGS: Brain: Mildly hyperdense, ill-defined lesion of the parasagittal right frontal lobe appears increased in size compared to prior CTs. The hyperdensity may reflect hypercellularity. Ill-defined hypoattenuating right parietal lesion appears unchanged. No new hemorrhage or loss of gray-white differentiation. Ventricles are stable in size. Vascular: No hyperdense vessel or unexpected calcification. Skull: Calvarium is unremarkable. Sinuses/Orbits: No acute finding. Other: None. IMPRESSION: Increase in size of  mild hyperdense parasagittal right frontal lobe lesion. Stable appearance of hypoattenuating right parietal lesion. Glioma is the most likely differential consideration at this time and the right frontal lesion is unlikely to be low-grade given rapid change. Electronically Signed   By: Macy Mis M.D.   On: 02/01/2020 09:29   MR Brain W and Wo Contrast  Result Date: 02/01/2020 CLINICAL DATA:  Left-sided weakness and seizure today. History of recent stroke. TPA 01/14/2020 EXAM: MRI HEAD WITHOUT AND WITH CONTRAST TECHNIQUE: Multiplanar, multiecho pulse sequences of the brain and surrounding structures were obtained without and with intravenous contrast. CONTRAST:  59mL GADAVIST GADOBUTROL 1 MMOL/ML IV SOLN COMPARISON:  CT head 02/01/2020. MRI head without and with contrast 01/14/2020 FINDINGS: Brain: There are 2 brain lesions. The right medial posterior frontal lesion shows progression since the prior study. The right parietal cortical lesion is unchanged Right medial frontal lesion has enlarged in the interval. There remains facilitated diffusion. This lesion has increased signal on T2 and primarily involves the cortex. Following contrast infusion, there is significant progression of enhancement and enlargement of the lesion the lesion shows irregular peripheral enhancement with small adjacent satellite lesions. There is central necrosis. No hemorrhage. Previously there was a small mildly enhancing lesion. Enhancement pattern suggestive an aggressive neoplasm. The second lesion in the right parietal cortex shows increased signal on T2 with cortical thickening but no enhancement. No change from the prior study. No new lesions identified elsewhere. Ventricle size is normal. No midline shift. No acute infarct. Mild  subcortical white matter hyperintensities likely due to chronic ischemia. Small chronic infarct left cerebellum. Vascular: Normal arterial flow voids Skull and upper cervical spine: Negative  Sinuses/Orbits: Mild mucosal edema paranasal sinuses. Negative orbit Other: None IMPRESSION: Significant enlargement of the right medial frontal cortical lesion. This lesion appears aggressive with central necrosis and is most consistent with a high-grade glioma. Lymphoma considered given the hyperdensity on CT. Second lesion right parietal cortex shows no enhancement no interval change. This has the appearance of a low-grade glioma. Again lymphoma considered but not typical in appearance. Neuro surgical consultation recommended. Electronically Signed   By: Franchot Gallo M.D.   On: 02/01/2020 12:55    Assessment/Plan: Right brain tumors, focal seizure: I have discussed the situation with the patient.  The plan is for biopsy next week sometime by Dr. Reatha Armour.  I have answered all his questions.  I think is fine to send him home and contact our office to schedule the surgery next week.  Please call if I can be of further assistance.  LOS: 0 days     Ophelia Charter 02/02/2020, 11:02 AM

## 2020-02-02 NOTE — Discharge Summary (Signed)
Physician Discharge Summary  John Watson OMB:[id number] DOB: 01-14-1961 DOA: 02/01/2020  PCP: Patient, No Pcp Per  Admit date: 02/01/2020 Discharge date: 02/02/2020  Admitted From: Home  Disposition:  Home   Recommendations for Outpatient Follow-up and new medication changes:  1. Follow up with Primary Care in 7 days.  2. Keppra has been increased to 1000 mg po bid 3. Continue with seizure precautions.  4. Follow up with neurology as outpatient in 7 days.  5. Follow up with Neurosurgery as scheduled.   Per [facility] statutes, patients with seizures are not allowed to drive until  they have been seizure-free for six months. Use caution when using heavy equipment or power tools. Avoid working on ladders or at heights. Take showers instead of baths. Ensure the water temperature is not too high on the home water heater. Do not go swimming alone. When caring for infants or small children, sit down when holding, feeding, or changing them to minimize risk of injury to the child in the event you have a seizure. Also, Maintain good sleep hygiene. Avoid alcohol.  If patient has another seizure, call 911 and bring them back to the ED if: A.  The seizure lasts longer than 5 minutes.      B.  The patient doesn't wake shortly after the seizure or has new problems such as difficulty seeing, speaking or moving following the seizure C.  The patient was injured during the seizure D.  The patient has a temperature over 102 F (39C) E.  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  At home patient had acute onset of weakness of left arm and leg, associated with paresthesias, followed by jerking movements left arm for about 15 to 20 minutes.  No loss of consciousness.  On his initial physical examination blood pressure 128/85, heart rate 88, temperature 97.8, respiratory rate 20, oxygen saturation 100%.  He was awake and alert, his lungs were clear to auscultation bilaterally, heart S1-S2, present rhythmic, soft abdomen, no lower extremity edema. Sodium 140, potassium 3.9, chloride 105, bicarb 26, glucose 102, BUN 10, creatinine 1.8, white count 5.2, hemoglobin 12.8, hematocrit 39.9, platelets 352.  SARS COVID-19 negative.  Urinalysis specific gravity 1.003, negative nitrates, negative leukocytes.  EKG 95 bpm, left axis deviation, left anterior fascicular block, sinus rhythm, no ST segment T wave changes, poor R wave progression.  Head CT with increased size of mild hyperdense parasagittal right frontal lobe lesion.  Brain MRI with significant enlargement of the right medial frontal cortical lesion.  Lesion appears aggressive, central necrosis, consistent with high-grade glioma.  Second lesion right parietal cortex, appearance of a low-grade glioma  1.  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  Patient was admitted to the medical ward, he had frequent neuro checks, no further seizures noted.  Patient was seen by neurosurgery with plan of brain biopsy next week by Dr. Reatha Armour.   I discussed case with  neurology over the phone, recommendations to increase Keppra to 1000 mg twice daily.  Patient has received 750 mg by mouth this morning, will add 500 mg bolus before his discharge.  Continue seizure precautions  2.  CVA.  Continue holding aspirin and clopidogrel, likely lesion related to malignancy more than  ischemic event.   3.  Dyslipidemia.  Continue statin therapy.  Discharge Diagnoses:  Active Problems:   Seizures ([place])   Hyperlipidemia LDL goal <70   Glioma [facility])    Discharge Instructions   Allergies as of 02/02/2020   No Known Allergies     Medication List    STOP taking these medications   aspirin 81 MG EC tablet   clopidogrel 75 MG tablet Commonly known as: PLAVIX     TAKE these medications   atorvastatin 80 MG tablet Commonly known as: LIPITOR Take 1 tablet (80 mg total) by mouth daily.   levETIRAcetam 1000 MG tablet Commonly known as: KEPPRA Take 1 tablet (1,000 mg total) by mouth 2 (two) times daily. What changed:   medication strength  how much to take       No Known Allergies  Consultations:  Neurosurgery   Neurology (phone)    Procedures/Studies: EEG  Result Date: 01/14/2020 Lora Havens, MD     01/14/2020  3:43 PM Patient Name: John Watson MRN: [id number] Epilepsy Attending: Lora Havens Referring Physician/Provider: Etta Quill, PA Date: 01/14/2020 Duration: 22.03 minutes Patient history: 59 year old male with sudden onset left upper and lower extremity weakness.  EEG to evaluate for seizures. Level of alertness: Awake AEDs during EEG study: Keppra Technical aspects: This EEG study was done with scalp electrodes positioned according to the 10-20 International system of electrode placement. Electrical activity was acquired at a sampling rate of 500Hz  and reviewed with a high frequency filter of 70Hz  and a low frequency filter of 1Hz . EEG data were recorded continuously and digitally stored. Description: The posterior dominant rhythm consists of 9.5 Hz activity of moderate voltage (25-35 uV) seen predominantly in posterior head regions, symmetric and reactive to eye opening and eye closing. EEG showed intermittent 3 to 6 Hz theta-delta slowing in the right frontotemporal region.  Hyperventilation and photic stimulation were not performed.    ABNORMALITY -Intermittent slow, right frontotemporal region IMPRESSION: This study is suggestive of cortical dysfunction in right frontotemporal region, nonspecific etiology.  No seizures or epileptiform discharges were seen throughout the recording. Lora Havens   CT Head Wo Contrast  Result Date: 02/01/2020 CLINICAL DATA:  Left-sided numbness EXAM: CT HEAD WITHOUT CONTRAST TECHNIQUE: Contiguous axial images were obtained from the base of the skull through the vertex without intravenous contrast. COMPARISON:  Recent CT and MR imaging FINDINGS: Brain: Mildly hyperdense, ill-defined lesion of the parasagittal right frontal lobe appears increased in size compared to prior CTs. The hyperdensity may reflect hypercellularity. Ill-defined hypoattenuating right parietal lesion appears unchanged. No new hemorrhage or loss of gray-white differentiation. Ventricles are stable in size. Vascular: No hyperdense vessel or unexpected calcification. Skull: Calvarium is unremarkable. Sinuses/Orbits: No acute finding. Other: None. IMPRESSION: Increase in size of mild hyperdense parasagittal right frontal lobe lesion. Stable appearance of hypoattenuating right parietal lesion. Glioma is the most likely differential consideration at this time and the right frontal lesion is unlikely to be low-grade given rapid change. Electronically Signed   By: Macy Mis M.D.   On: 02/01/2020 09:29   CT HEAD WO CONTRAST  Result Date: 01/15/2020 CLINICAL DATA:  Stroke follow-up EXAM: CT  HEAD WITHOUT CONTRAST TECHNIQUE: Contiguous axial images were obtained from the base of the skull through the vertex without intravenous contrast. COMPARISON:  Head CT from yesterday FINDINGS: Brain: The parasagittal right posterior frontal lesion is mildly dense by CT. The lateral right parietal lesion is low-density. No hematoma, hydrocephalus, shift, or interval finding. Small remote left cerebellar infarction. Vascular: No hyperdense vessel or  unexpected calcification. Skull: Negative Sinuses/Orbits: Negative IMPRESSION: Stable low-density right parietal and high-density right frontal lesions, see preceding brain MRI. Electronically Signed   By: Monte Fantasia M.D.   On: 01/15/2020 05:44   MR BRAIN WO CONTRAST  Result Date: 01/14/2020 CLINICAL DATA:  Follow-up stroke EXAM: MRI HEAD WITHOUT CONTRAST TECHNIQUE: Multiplanar, multiecho pulse sequences of the brain and surrounding structures were obtained without intravenous contrast. COMPARISON:  CT head 01/14/2020 FINDINGS: Brain: Cortical edema in the right medial frontal lobe posteriorly with facilitated diffusion. Cortical edema in the right parietal cortex without abnormality on diffusion. This is higher signal on T2 than the lesion in the right frontal lobe and appears to have some masslike characteristics. No abnormality on diffusion-weighted imaging. Possible cortical tumor. This area measures approximately 23 mm in diameter. Few small scattered hyperintensities in the white matter bilaterally. Negative for hemorrhage or mass. Vascular: Normal arterial flow voids. Skull and upper cervical spine: No focal skeletal lesion. Sinuses/Orbits: Mild mucosal edema paranasal sinuses. Negative orbit Other: None IMPRESSION: Cortical edema with restricted diffusion in the right medial frontal lobe. Differential diagnosis includes subacute infarct, seizure related activity, and less likely tumor. 23 mm cortically based lesion in the right parietal lobe with mild mass-effect. 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 Negative for acute infarct Postcontrast imaging may be helpful. Follow-up imaging may also be helpful to evaluate for evolutionary changes of ischemia. These results were called by telephone at the time of interpretation on 01/14/2020 at 4:24 pm to provider Leonel Ramsay, who verbally acknowledged these results. Electronically Signed   By: Franchot Gallo M.D.   On: 01/14/2020 16:25   MR BRAIN W  CONTRAST  Result Date: 01/14/2020 CLINICAL DATA:  Brain mass EXAM: MRI HEAD WITH CONTRAST TECHNIQUE: Multiplanar, multiecho pulse sequences of the brain and surrounding structures were obtained with intravenous contrast. CONTRAST:  75mL GADAVIST GADOBUTROL 1 MMOL/ML IV SOLN COMPARISON:  Brain MRI without contrast 01/14/2020 FINDINGS: In the paramedian superior right frontal lobe, there is an area of poorly circumscribed contrast enhancement in the region of the FLAIR abnormality shown on the earlier study. The area of enhancement measures approximately 16 x 9 mm. There is no contrast enhancement at the site of the posterior right parietal lobe lesion. IMPRESSION: Area of poorly circumscribed contrast enhancement associated with the right frontal lesion, but not with the right parietal lesion. 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 Follow-up MRI with and without contrast in 4-6 weeks might be helpful for differentiation. Electronically Signed   By: Ulyses Jarred M.D.   On: 01/14/2020 21:52   MR Brain W and Wo Contrast  Result Date: 02/01/2020 CLINICAL DATA:  Left-sided weakness and seizure today. History of recent stroke. TPA 01/14/2020 EXAM: MRI HEAD WITHOUT AND WITH CONTRAST TECHNIQUE: Multiplanar, multiecho pulse sequences of the brain and surrounding structures were obtained without and with intravenous contrast. CONTRAST:  55mL GADAVIST GADOBUTROL 1 MMOL/ML IV SOLN COMPARISON:  CT head 02/01/2020. MRI head without and with contrast 01/14/2020 FINDINGS: Brain: There are 2 brain lesions. The right medial posterior frontal lesion shows progression since the prior study. The right  parietal cortical lesion is unchanged Right medial frontal lesion has enlarged in the interval. There remains facilitated diffusion. This lesion has increased signal on T2 and primarily involves the cortex. Following contrast infusion, there is significant progression of enhancement and enlargement of  the lesion the lesion shows irregular peripheral enhancement with small adjacent satellite lesions. There is central necrosis. No hemorrhage. Previously there was a small mildly enhancing lesion. Enhancement pattern suggestive an aggressive neoplasm. The second lesion in the right parietal cortex shows increased signal on T2 with cortical thickening but no enhancement. No change from the prior study. No new lesions identified elsewhere. Ventricle size is normal. No midline shift. No acute infarct. Mild subcortical white matter hyperintensities likely due to chronic ischemia. Small chronic infarct left cerebellum. Vascular: Normal arterial flow voids Skull and upper cervical spine: Negative Sinuses/Orbits: Mild mucosal edema paranasal sinuses. Negative orbit Other: None IMPRESSION: Significant enlargement of the right medial frontal cortical lesion. This lesion appears aggressive with central necrosis and is most consistent with a high-grade glioma. Lymphoma considered given the hyperdensity on CT. Second lesion right parietal cortex shows no enhancement no interval change. This has the appearance of a low-grade glioma. Again lymphoma considered but not typical in appearance. Neuro surgical consultation recommended. Electronically Signed   By: Franchot Gallo M.D.   On: 02/01/2020 12:55   CT CHEST ABDOMEN PELVIS W CONTRAST  Result Date: 01/15/2020 CLINICAL DATA:  Brain lesion, malignancy eval. EXAM: CT CHEST, ABDOMEN, AND PELVIS WITH CONTRAST TECHNIQUE: Multidetector CT imaging of the chest, abdomen and pelvis was performed following the standard protocol during bolus administration of intravenous contrast. CONTRAST:  156mL OMNIPAQUE IOHEXOL 350 MG/ML SOLN COMPARISON:  None. FINDINGS: CT CHEST FINDINGS Cardiovascular: Atherosclerosis of the thoracic aorta. No aortic dissection. No pulmonary embolus to the lobar level. Heart is normal in size. No pericardial effusion. Mediastinum/Nodes: No enlarged mediastinal or  hilar lymph nodes. There is no axillary or supraclavicular adenopathy. No esophageal wall thickening. No visualized thyroid nodule. Lungs/Pleura: No dominant pulmonary mass. There is a 3 mm perifissural nodule in the right middle lobe, series 3, image 65. Dependent densities in both lungs typical of hypoventilatory atelectasis. Minimal subsegmental atelectasis in the lingula. Trachea and central bronchi are patent. There is no pleural fluid. No findings of pulmonary edema. Musculoskeletal: No focal osseous lesion. Mild degenerative change in the thoracic spine. No acute osseous abnormality. No chest wall or subcutaneous lesion. CT ABDOMEN PELVIS FINDINGS Hepatobiliary: There are multiple small subcentimeter low-density lesions throughout the liver that are too small to accurately characterize. Largest lesion measures 8 mm in the subcapsular right lobe, series 3, image 41. Mild background diffusely decreased hepatic density typical of steatosis. High-density material in the gallbladder is likely vicarious excretion from head and neck CTA yesterday. This outlines multiple intraluminal gallstones. There is no biliary dilatation or pericholecystic inflammation. Pancreas: No evidence of pancreatic mass. No ductal dilatation or inflammation. Spleen: Normal in size without focal abnormality. Adrenals/Urinary Tract: No adrenal nodule. No hydronephrosis or perinephric edema. Homogeneous renal enhancement with symmetric excretion on delayed phase imaging. 7 mm low-density lesion in the lower left kidney is nonspecific but likely represent cysts. Urinary bladder is physiologically distended without wall thickening. Stomach/Bowel: Stomach is distended with enteric contrast. There is no gastric wall thickening. No duodenal wall thickening. Normal positioning of the ligament of Treitz. Enteric contrast reaches the mid small bowel. There is no small bowel obstruction or inflammation. Normal appendix. Cecum is located in the  midline pelvis. Small to  moderate volume of stool throughout the colon. No obvious colonic wall thickening or mass. There is no pericolonic edema. Vascular/Lymphatic: Mild aorto bi-iliac atherosclerosis. No aortic aneurysm. The portal vein is patent. No enlarged lymph nodes in the abdomen or pelvis. Reproductive: Enlarged prostate gland spans 6.6 cm transverse. Other: No ascites. No omental thickening or evidence of peritoneal disease. Tiny fat containing umbilical hernia. Musculoskeletal: Tiny punctate density in the right iliac bone is nonspecific but favored to represent a bone island. No suspicious bone lesion. No subcutaneous or soft tissue lesion. IMPRESSION: 1. No definitive evidence of primary malignancy in the chest, abdomen, or pelvis. 2. Multiple small subcentimeter low-density lesions throughout the liver that are too small to accurately characterize, largest measures 8 mm. These are nonspecific in the setting of questioned malignancy, but in the absence of additional signs of malignancy are statistically benign cysts. 3. Tiny 3 mm perifissural right middle lobe pulmonary nodule, nonspecific. This may represent an intrapulmonary lymph node. Consider follow-up in 1 year. 4. Mild hepatic steatosis. 5. Cholelithiasis without gallbladder inflammation. 6. Enlarged prostate gland. Aortic Atherosclerosis (ICD10-I70.0). Electronically Signed   By: Keith Rake M.D.   On: 01/15/2020 20:45   CT CEREBRAL PERFUSION W CONTRAST  Result Date: 01/14/2020 CLINICAL DATA:  Code stroke follow-up, left-sided weakness EXAM: CT ANGIOGRAPHY HEAD AND NECK CT PERFUSION BRAIN TECHNIQUE: Multidetector CT imaging of the head and neck was performed using the standard protocol during bolus administration of intravenous contrast. Multiplanar CT image reconstructions and MIPs were obtained to evaluate the vascular anatomy. Carotid stenosis measurements (when applicable) are obtained utilizing NASCET criteria, using the distal  internal carotid diameter as the denominator. Multiphase CT imaging of the brain was performed following IV bolus contrast injection. Subsequent parametric perfusion maps were calculated using RAPID software. CONTRAST:  174mL OMNIPAQUE IOHEXOL 350 MG/ML SOLN COMPARISON:  None. FINDINGS: CTA NECK FINDINGS Aortic arch: Great vessel origins are patent. Right carotid system: Patent. No measurable stenosis at the ICA origin. Left carotid system: Patent. Minimal calcified plaque at the ICA origin without measurable stenosis. Vertebral arteries: Patent and codominant. Skeleton: Degenerative changes of the cervical spine Other neck: No mass or adenopathy. Upper chest: No apical lung mass. Review of the MIP images confirms the above findings CTA HEAD FINDINGS Anterior circulation: Intracranial internal carotid arteries patent. Anterior and middle cerebral arteries are patent. Posterior circulation: Intracranial vertebral arteries, basilar artery and posterior cerebral arteries are patent. Venous sinuses: As permitted by contrast timing, patent. Review of the MIP images confirms the above findings CT Brain Perfusion Findings: CBF (<30%) Volume: 37mL Perfusion (Tmax>6.0s) volume: 20mL Mismatch Volume: 24mL Infarction Location: None. IMPRESSION: No large vessel occlusion or hemodynamically significant stenosis. Perfusion imaging demonstrates no evidence of core infarction or territory at risk. These results were called by telephone at the time of interpretation on 01/14/2020 at 11:27 am to provider Dr. Stark Jock, Who verbally acknowledged these results. Electronically Signed   By: Macy Mis M.D.   On: 01/14/2020 11:27   VAS [place] TRANSCRANIAL DOPPLER W BUBBLES  Result Date: 01/17/2020  Transcranial Doppler with Bubble Indications: Stroke. Performing Technologist: Abram Sander RVS  Examination Guidelines: A complete evaluation includes B-mode imaging, spectral Doppler, color Doppler, and power Doppler as needed of all accessible  portions of each vessel. Bilateral testing is considered an integral part of a complete examination. Limited examinations for reoccurring indications may be performed as noted.  Summary: No HITS at rest or during Valsalva. Negative transcranial Doppler Bubble study with no evidence of  right to left intracardiac communication.  A vascular evaluation was performed. The right middle cerebral artery was studied. An IV was inserted into the patient's left forearm. Verbal informed consent was obtained.  Negative TCD Bubble study showing no significant right to left shunt *See table(s) above for TCD measurements and observations.  Diagnosing physician: Antony Contras MD Electronically signed by Antony Contras MD on 01/17/2020 at 12:53:47 PM.    Final    ECHOCARDIOGRAM COMPLETE  Result Date: 01/15/2020    ECHOCARDIOGRAM REPORT   Patient Name:   MARCH STEYER Moretto Date of Exam: 01/15/2020 Medical Rec #:  [id number]        Height:       71.0 in Accession #:    [id number]       Weight:       198.1 lb Date of Birth:  Jan 14, 1961        BSA:          2.100 m Patient Age:    16 years         BP:           123/75 mmHg Patient Gender: M                HR:           74 bpm. Exam Location:  Inpatient Procedure: 2D Echo, Color Doppler and Cardiac Doppler Indications:    Stroke i163.9  History:        Patient has no prior history of Echocardiogram examinations.  Sonographer:    Raquel Sarna Senior RDCS Referring Phys: Bertie  1. Left ventricular ejection fraction, by estimation, is 65 to 70%. The left ventricle has normal function. The left ventricle has no regional wall motion abnormalities. There is moderate asymmetric left ventricular hypertrophy of the basal-septal segment. Left ventricular diastolic parameters were normal.  2. Right ventricular systolic function is normal. The right ventricular size is normal. Tricuspid regurgitation signal is inadequate for assessing PA pressure.  3. The mitral valve is normal in  structure. No evidence of mitral valve regurgitation.  4. The aortic valve is tricuspid. Thickened leaflets. Aortic valve regurgitation is not visualized. Mild aortic valve sclerosis is present, with no evidence of aortic valve stenosis.  5. The interatrial septum was not well visualized Conclusion(s)/Recommendation(s): No clear source of embolism seen. Consider TEE if clinically indicated. FINDINGS  Left Ventricle: Left ventricular ejection fraction, by estimation, is 65 to 70%. The left ventricle has normal function. The left ventricle has no regional wall motion abnormalities. The left ventricular internal cavity size was normal in size. There is  moderate asymmetric left ventricular hypertrophy of the basal-septal segment. Left ventricular diastolic parameters were normal. Right Ventricle: The right ventricular size is normal. Right vetricular wall thickness was not assessed. Right ventricular systolic function is normal. Tricuspid regurgitation signal is inadequate for assessing PA pressure. Left Atrium: Left atrial size was normal in size. Right Atrium: Right atrial size was normal in size. Pericardium: There is no evidence of pericardial effusion. Mitral Valve: The mitral valve is normal in structure. No evidence of mitral valve regurgitation. Tricuspid Valve: The tricuspid valve is normal in structure. Tricuspid valve regurgitation is trivial. Aortic Valve: The aortic valve is tricuspid. Aortic valve regurgitation is not visualized. Mild aortic valve sclerosis is present, with no evidence of aortic valve stenosis. Pulmonic Valve: The pulmonic valve was grossly normal. Pulmonic valve regurgitation is not visualized. Aorta: The aortic root and ascending aorta are structurally normal, with  no evidence of dilitation. IAS/Shunts: The interatrial septum was not well visualized.  LEFT VENTRICLE PLAX 2D LVIDd:         4.10 cm  Diastology LVIDs:         2.40 cm  LV e' lateral:   8.92 cm/s LV PW:         1.20 cm  LV  E/e' lateral: 10.6 LV IVS:        1.20 cm  LV e' medial:    8.49 cm/s LVOT diam:     2.20 cm  LV E/e' medial:  11.1 LV SV:         79 LV SV Index:   38 LVOT Area:     3.80 cm  RIGHT VENTRICLE RV S prime:     15.70 cm/s TAPSE (M-mode): 2.4 cm LEFT ATRIUM             Index       RIGHT ATRIUM           Index LA diam:        3.40 cm 1.62 cm/m  RA Area:     13.60 cm LA Vol (A2C):   49.5 ml 23.57 ml/m RA Volume:   30.60 ml  14.57 ml/m LA Vol (A4C):   27.2 ml 12.95 ml/m LA Biplane Vol: 37.9 ml 18.05 ml/m  AORTIC VALVE LVOT Vmax:   104.00 cm/s LVOT Vmean:  71.600 cm/s LVOT VTI:    0.209 m  AORTA Ao Root diam: 3.40 cm Ao Asc diam:  3.50 cm MITRAL VALVE MV Area (PHT): 4.15 cm    SHUNTS MV Decel Time: 183 msec    Systemic VTI:  0.21 m MV E velocity: 94.30 cm/s  Systemic Diam: 2.20 cm MV A velocity: 60.80 cm/s MV E/A ratio:  1.55 [name] MD Electronically signed by [name] MD Signature Date/Time: 01/15/2020/11:05:03 AM    Final    ECHO TEE  Result Date: 01/17/2020    TRANSESOPHOGEAL ECHO REPORT   Patient Name:   ZVI DUPLANTIS Godfrey Date of Exam: 01/17/2020 Medical Rec #:  [id number]        Height:       71.0 in Accession #:    [id number]       Weight:       198.1 lb Date of Birth:  1960/09/13        BSA:          2.100 m Patient Age:    59 years         BP:           160/85 mmHg Patient Gender: M                HR:           87 bpm. Exam Location:  Inpatient Procedure: Transesophageal Echo, 3D Echo and Saline Contrast Bubble Study Indications:     stroke 434.91  History:         Patient has prior history of Echocardiogram examinations, most                  recent 01/15/2020. Risk Factors:Former Smoker and Dyslipidemia.  Sonographer:     Johny Chess Referring Phys:  [id number] [place] Diagnosing Phys: [name] MD PROCEDURE: After discussion of the risks and benefits of a TEE, an informed consent was obtained from the patient. The transesophogeal probe was passed without  difficulty through the esophogus of the patient. Sedation performed by different  physician. The patient was monitored while under deep sedation. Anesthestetic sedation was provided intravenously by Anesthesiology: 330mg  of Propofol, 40mg  of Lidocaine. The patient developed no complications during the procedure. IMPRESSIONS  1. Left ventricular ejection fraction, by estimation, is 60 to 65%. The left ventricle has normal function. The left ventricle has no regional wall motion abnormalities. There is moderate left ventricular hypertrophy.  2. Right ventricular systolic function is normal. The right ventricular size is mildly enlarged.  3. No left atrial/left atrial appendage thrombus was detected.  4. The mitral valve is normal in structure. Trivial mitral valve regurgitation.  5. The aortic valve is tricuspid. Aortic valve regurgitation is not visualized.  6. Agitated saline contrast bubble study was minimally positive, which could be consistent with very small PFO. FINDINGS  Left Ventricle: Left ventricular ejection fraction, by estimation, is 60 to 65%. The left ventricle has normal function. The left ventricle has no regional wall motion abnormalities. The left ventricular internal cavity size was normal in size. There is  moderate left ventricular hypertrophy. Right Ventricle: The right ventricular size is mildly enlarged. Right vetricular wall thickness was not assessed. Right ventricular systolic function is normal. Left Atrium: Left atrial size was normal in size. No left atrial/left atrial appendage thrombus was detected. Right Atrium: Right atrial size was normal in size. Pericardium: There is no evidence of pericardial effusion. Mitral Valve: The mitral valve is normal in structure. Trivial mitral valve regurgitation. Tricuspid Valve: The tricuspid valve is normal in structure. Tricuspid valve regurgitation is trivial. Aortic Valve: The aortic valve is tricuspid. Aortic valve regurgitation is not  visualized. Pulmonic Valve: The pulmonic valve was grossly normal. Pulmonic valve regurgitation is not visualized. Aorta: The aortic root is normal in size and structure. IAS/Shunts: No atrial level shunt detected by color flow Doppler. Agitated saline contrast was given intravenously to evaluate for intracardiac shunting. Agitated saline contrast bubble study was positive with shunting observed within 3-6 cardiac cycles suggestive of interatrial shunt. [name] MD Electronically signed by [name] MD Signature Date/Time: 01/17/2020/3:28:46 PM    Final    CUP PACEART INCLINIC DEVICE CHECK  Result Date: 01/31/2020 ILR wound check in clinic. Steri strips removed prior to appointment by patient Wound well healed. Home monitor transmitting nightly. No episodes. Questions answered. ILR wound check in clinic. Steri strips removed prior to appointment by patient Wound well healed. Home monitor transmitting nightly. No episodes. Questions answered.  CT HEAD CODE STROKE WO CONTRAST  Result Date: 01/14/2020 CLINICAL DATA:  Code stroke. EXAM: CT HEAD WITHOUT CONTRAST TECHNIQUE: Contiguous axial images were obtained from the base of the skull through the vertex without intravenous contrast. COMPARISON:  None. FINDINGS: Brain: No acute intracranial hemorrhage, mass effect, or edema. Gray-white differentiation is preserved. Minimal patchy hypoattenuation in the supratentorial white matter, for example in the right parietal subcortical region. Vascular: No hyperdense vessel or unexpected calcification. Skull: Unremarkable. Sinuses/Orbits: No acute finding. Other: Mastoid air cells are clear. ASPECTS ([facility] Stroke Program Early CT Score) - Ganglionic level infarction (caudate, lentiform nuclei, internal capsule, insula, M1-M3 cortex): 7 - Supraganglionic infarction (M4-M6 cortex): 3 Total score (0-10 with 10 being normal): 10 IMPRESSION: No acute intracranial hemorrhage or evidence of acute  infarction. ASPECT score is 10. Minimal patchy hypoattenuation in the supratentorial white matter may reflect nonspecific gliosis/demyelination. Focus in the right parietal subcortical white matter could also reflect an area of recent infarction. These results were called by telephone at the time of interpretation on 01/14/2020 at 8:48 am  to provider Veryl Speak , who verbally acknowledged these results. Electronically Signed   By: Macy Mis M.D.   On: 01/14/2020 08:53   CT ANGIO HEAD CODE STROKE  Result Date: 01/14/2020 CLINICAL DATA:  Code stroke follow-up, left-sided weakness EXAM: CT ANGIOGRAPHY HEAD AND NECK CT PERFUSION BRAIN TECHNIQUE: Multidetector CT imaging of the head and neck was performed using the standard protocol during bolus administration of intravenous contrast. Multiplanar CT image reconstructions and MIPs were obtained to evaluate the vascular anatomy. Carotid stenosis measurements (when applicable) are obtained utilizing NASCET criteria, using the distal internal carotid diameter as the denominator. Multiphase CT imaging of the brain was performed following IV bolus contrast injection. Subsequent parametric perfusion maps were calculated using RAPID software. CONTRAST:  144mL OMNIPAQUE IOHEXOL 350 MG/ML SOLN COMPARISON:  None. FINDINGS: CTA NECK FINDINGS Aortic arch: Great vessel origins are patent. Right carotid system: Patent. No measurable stenosis at the ICA origin. Left carotid system: Patent. Minimal calcified plaque at the ICA origin without measurable stenosis. Vertebral arteries: Patent and codominant. Skeleton: Degenerative changes of the cervical spine Other neck: No mass or adenopathy. Upper chest: No apical lung mass. Review of the MIP images confirms the above findings CTA HEAD FINDINGS Anterior circulation: Intracranial internal carotid arteries patent. Anterior and middle cerebral arteries are patent. Posterior circulation: Intracranial vertebral arteries, basilar  artery and posterior cerebral arteries are patent. Venous sinuses: As permitted by contrast timing, patent. Review of the MIP images confirms the above findings CT Brain Perfusion Findings: CBF (<30%) Volume: 64mL Perfusion (Tmax>6.0s) volume: 35mL Mismatch Volume: 66mL Infarction Location: None. IMPRESSION: No large vessel occlusion or hemodynamically significant stenosis. Perfusion imaging demonstrates no evidence of core infarction or territory at risk. These results were called by telephone at the time of interpretation on 01/14/2020 at 11:27 am to provider Dr. Stark Jock, Who verbally acknowledged these results. Electronically Signed   By: Macy Mis M.D.   On: 01/14/2020 11:27   CT ANGIO NECK CODE STROKE  Result Date: 01/14/2020 CLINICAL DATA:  Code stroke follow-up, left-sided weakness EXAM: CT ANGIOGRAPHY HEAD AND NECK CT PERFUSION BRAIN TECHNIQUE: Multidetector CT imaging of the head and neck was performed using the standard protocol during bolus administration of intravenous contrast. Multiplanar CT image reconstructions and MIPs were obtained to evaluate the vascular anatomy. Carotid stenosis measurements (when applicable) are obtained utilizing NASCET criteria, using the distal internal carotid diameter as the denominator. Multiphase CT imaging of the brain was performed following IV bolus contrast injection. Subsequent parametric perfusion maps were calculated using RAPID software. CONTRAST:  170mL OMNIPAQUE IOHEXOL 350 MG/ML SOLN COMPARISON:  None. FINDINGS: CTA NECK FINDINGS Aortic arch: Great vessel origins are patent. Right carotid system: Patent. No measurable stenosis at the ICA origin. Left carotid system: Patent. Minimal calcified plaque at the ICA origin without measurable stenosis. Vertebral arteries: Patent and codominant. Skeleton: Degenerative changes of the cervical spine Other neck: No mass or adenopathy. Upper chest: No apical lung mass. Review of the MIP images confirms the above findings  CTA HEAD FINDINGS Anterior circulation: Intracranial internal carotid arteries patent. Anterior and middle cerebral arteries are patent. Posterior circulation: Intracranial vertebral arteries, basilar artery and posterior cerebral arteries are patent. Venous sinuses: As permitted by contrast timing, patent. Review of the MIP images confirms the above findings CT Brain Perfusion Findings: CBF (<30%) Volume: 27mL Perfusion (Tmax>6.0s) volume: 81mL Mismatch Volume: 25mL Infarction Location: None. IMPRESSION: No large vessel occlusion or hemodynamically significant stenosis. Perfusion imaging demonstrates no evidence of core infarction or  territory at risk. These results were called by telephone at the time of interpretation on 01/14/2020 at 11:27 am to provider Dr. Stark Jock, Who verbally acknowledged these results. Electronically Signed   By: Macy Mis M.D.   On: 01/14/2020 11:27        Subjective: Patient is feeling better, no nausea or vomiting, no chest pain or dyspnea. No further seizure activity.   Discharge Exam: Vitals:   02/02/20 0141 02/02/20 0942  BP: 126/84 131/78  Pulse: 80 92  Resp: 16 16  Temp: 98 F (36.7 C) 98 F (36.7 C)  SpO2: 99% 99%   Vitals:   02/01/20 1800 02/01/20 2207 02/02/20 0141 02/02/20 0942  BP: (!) 140/92 (!) 131/93 126/84 131/78  Pulse: 81 87 80 92  Resp: 15 18 16 16   Temp:  97.9 F (36.6 C) 98 F (36.7 C) 98 F (36.7 C)  TempSrc:  Oral Oral Oral  SpO2: 97% 100% 99% 99%  Weight:      Height:        General: Not in pain or dyspnea.  Neurology: Awake and alert, non focal  E ENT: no pallor, no icterus, oral mucosa moist Cardiovascular: No JVD. S1-S2 present, rhythmic, no gallops, rubs, or murmurs. No lower extremity edema. Pulmonary: vesicular breath sounds bilaterally, adequate air movement, no wheezing, rhonchi or rales. Gastrointestinal. Abdomen soft and non tender Skin. No rashes Musculoskeletal: no joint deformities   The results of significant  diagnostics from this hospitalization (including imaging, microbiology, ancillary and laboratory) are listed below for reference.     Microbiology: Recent Results (from the past 240 hour(s))  SARS Coronavirus 2 by RT PCR (hospital order, performed in [facility] hospital lab) Nasopharyngeal Nasopharyngeal Swab     Status: None   Collection Time: 02/01/20  2:52 PM   Specimen: Nasopharyngeal Swab  Result Value Ref Range Status   SARS Coronavirus 2 NEGATIVE NEGATIVE Final    Comment: (NOTE) SARS-CoV-2 target nucleic acids are NOT DETECTED.  The SARS-CoV-2 RNA is generally detectable in upper and lower respiratory specimens during the acute phase of infection. The lowest concentration of SARS-CoV-2 viral copies this assay can detect is 250 copies / mL. A negative result does not preclude SARS-CoV-2 infection and should not be used as the sole basis for treatment or other patient management decisions.  A negative result may occur with improper specimen collection / handling, submission of specimen other than nasopharyngeal swab, presence of viral mutation(s) within the areas targeted by this assay, and inadequate number of viral copies (<250 copies / mL). A negative result must be combined with clinical observations, patient history, and epidemiological information.  Fact Sheet for Patients:   StrictlyIdeas.no  Fact Sheet for Healthcare Providers: BankingDealers.co.za  This test is not yet approved or  cleared by the [place] FDA and has been authorized for detection and/or diagnosis of SARS-CoV-2 by FDA under an Emergency Use Authorization (EUA).  This EUA will remain in effect (meaning this test can be used) for the duration of the COVID-19 declaration under Section 564(b)(1) of the Act, 21 U.S.C. section 360bbb-3(b)(1), unless the authorization is terminated or revoked sooner.  Performed at [facility], [place] [address], [place], [place] [address]      Labs: BNP (last 3 results) No results for input(s): BNP in the last 8760 hours. Basic Metabolic Panel: Recent Labs  Lab 02/01/20 0751 02/02/20 0316  NA 140 140  K 3.9 3.7  CL 105 107  CO2 26  24  GLUCOSE 102* 100*  BUN 10 10  CREATININE 1.28* 1.08  CALCIUM 10.0 9.1   Liver Function Tests: Recent Labs  Lab 02/02/20 0316  AST 13*  ALT 17  ALKPHOS 54  BILITOT 1.1  PROT 6.5  ALBUMIN 3.7   No results for input(s): LIPASE, AMYLASE in the last 168 hours. No results for input(s): AMMONIA in the last 168 hours. CBC: Recent Labs  Lab 02/01/20 0751 02/02/20 0316  WBC 5.2 5.5  HGB 12.8* 12.4*  HCT 39.9 38.6*  MCV 92.1 92.6  PLT 352 328   Cardiac Enzymes: No results for input(s): CKTOTAL, CKMB, CKMBINDEX, TROPONINI in the last 168 hours. BNP: Invalid input(s): POCBNP CBG: Recent Labs  Lab 02/01/20 0752  GLUCAP 96   D-Dimer No results for input(s): DDIMER in the last 72 hours. Hgb A1c No results for input(s): HGBA1C in the last 72 hours. Lipid Profile No results for input(s): CHOL, HDL, LDLCALC, TRIG, CHOLHDL, LDLDIRECT in the last 72 hours. Thyroid function studies No results for input(s): TSH, T4TOTAL, T3FREE, THYROIDAB in the last 72 hours.  Invalid input(s): FREET3 Anemia work up No results for input(s): VITAMINB12, FOLATE, FERRITIN, TIBC, IRON, RETICCTPCT in the last 72 hours. Urinalysis    Component Value Date/Time   COLORURINE COLORLESS (A) 02/01/2020 0730   APPEARANCEUR CLEAR 02/01/2020 0730   LABSPEC 1.003 (L) 02/01/2020 0730   PHURINE 8.0 02/01/2020 0730   GLUCOSEU NEGATIVE 02/01/2020 0730   HGBUR SMALL (A) 02/01/2020 0730   BILIRUBINUR NEGATIVE 02/01/2020 0730   KETONESUR NEGATIVE 02/01/2020 0730   PROTEINUR NEGATIVE 02/01/2020 0730   NITRITE NEGATIVE 02/01/2020 0730   LEUKOCYTESUR NEGATIVE 02/01/2020 0730   Sepsis Labs Invalid input(s): PROCALCITONIN,  WBC,  LACTICIDVEN Microbiology Recent Results (from  the past 240 hour(s))  SARS Coronavirus 2 by RT PCR (hospital order, performed in [place] hospital lab) Nasopharyngeal Nasopharyngeal Swab     Status: None   Collection Time: 02/01/20  2:52 PM   Specimen: Nasopharyngeal Swab  Result Value Ref Range Status   SARS Coronavirus 2 NEGATIVE NEGATIVE Final    Comment: (NOTE) SARS-CoV-2 target nucleic acids are NOT DETECTED.  The SARS-CoV-2 RNA is generally detectable in upper and lower respiratory specimens during the acute phase of infection. The lowest concentration of SARS-CoV-2 viral copies this assay can detect is 250 copies / mL. A negative result does not preclude SARS-CoV-2 infection and should not be used as the sole basis for treatment or other patient management decisions.  A negative result may occur with improper specimen collection / handling, submission of specimen other than nasopharyngeal swab, presence of viral mutation(s) within the areas targeted by this assay, and inadequate number of viral copies (<250 copies / mL). A negative result must be combined with clinical observations, patient history, and epidemiological information.  Fact Sheet for Patients:   StrictlyIdeas.no  Fact Sheet for Healthcare Providers: BankingDealers.co.za  This test is not yet approved or  cleared by the [place] FDA and has been authorized for detection and/or diagnosis of SARS-CoV-2 by FDA under an Emergency Use Authorization (EUA).  This EUA will remain in effect (meaning this test can be used) for the duration of the COVID-19 declaration under Section 564(b)(1) of the Act, 21 U.S.C. section 360bbb-3(b)(1), unless the authorization is terminated or revoked sooner.  Performed at [facility], [place] [address] [place], [place] [address]      Time coordinating discharge: 45 minutes  SIGNED:   Tawni Millers, MD  [facility] 02/02/2020, 11:40 AM

## 2020-02-02 NOTE — Evaluation (Signed)
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  At home patient had acute onset of weakness of left arm and leg, associated with paresthesias, followed by jerking movements left arm for about 15 to 20 minutes.  No loss of consciousness. Head CT with increased size of mild hyperdense parasagittal right frontal lobe lesion.  Brain MRI with significant enlargement of the right medial frontal cortical lesion.  Lesion appears aggressive, central necrosis, consistent with high-grade glioma.  Second lesion right parietal cortex, appearance of a low-grade glioma  Clinical Impression   Patient evaluated by Physical Therapy with no further acute PT needs identified. All education has been completed and the patient has no further questions. Patient scored 24/24 on DGI.  PT is signing off. Thank you for this referral.     Follow Up Recommendations No PT follow up    Equipment Recommendations  None recommended by PT    Recommendations for Other Services       Precautions / Restrictions Precautions Precautions: None      Mobility  Bed Mobility Overal bed mobility: Needs Assistance Bed Mobility: Supine to Sit     Supine to sit: Independent        Transfers Overall transfer level: Independent Equipment used: None Transfers: Sit to/from Stand Sit to Stand: Independent            Ambulation/Gait Ambulation/Gait assistance: Independent Gait Distance (Feet): 200 Feet Assistive device: None Gait Pattern/deviations: WFL(Within Functional Limits)   Gait velocity interpretation: >2.62 ft/sec, indicative of community ambulatory General  Gait Details: see DGI  Stairs            Wheelchair Mobility    Modified Rankin (Stroke Patients Only)       Balance Overall balance assessment: Independent                               Standardized Balance Assessment Standardized Balance Assessment : Dynamic Gait Index   Dynamic Gait Index Level Surface: Normal Change in Gait Speed: Normal Gait with Horizontal Head Turns: Normal Gait with Vertical Head Turns: Normal Gait and Pivot Turn: Normal Step Over Obstacle: Normal Step Around Obstacles: Normal Steps: Normal Total Score: 24       Pertinent Vitals/Pain Pain Assessment: No/denies pain    Home Living Family/patient expects to be discharged to:: Private residence Living Arrangements: Other relatives (brother and sister-in-law) Available Help at Discharge: Family;Available 24 hours/day (both retired) Type of Home: House Home Access: Stairs to enter Entrance Stairs-Rails: Psychiatric nurse of Steps: 6 Home Layout: One level Home Equipment: Grab bars - tub/shower      Prior Function Level of Independence: Independent         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    Extremity/Trunk Assessment   Upper Extremity Assessment Upper Extremity Assessment: LUE deficits/detail LUE Deficits / Details: strength 5/5, mild numbness and decreased [place] but inconsistent; able to use functionally  LUE Sensation: decreased light touch    Lower Extremity Assessment Lower Extremity Assessment: Overall WFL for tasks assessed  Cervical / Trunk Assessment Cervical / Trunk Assessment: Normal  Communication   Communication: No difficulties  Cognition Arousal/Alertness: Awake/alert Behavior During Therapy: WFL for tasks assessed/performed Overall Cognitive Status: Within Functional Limits for tasks assessed                                         General Comments      Exercises     Assessment/Plan    PT Assessment Patent does not need any further PT services  PT Problem List         PT Treatment Interventions      PT Goals (Current goals can be found in the Care Plan section)  Acute Rehab PT Goals PT Goal Formulation: All assessment and education complete, DC therapy    Frequency     Barriers to discharge        Co-evaluation               AM-PAC PT "6 Clicks" Mobility  Outcome Measure Help needed turning from your back to your side while in a flat bed without using bedrails?: None Help needed moving from lying on your back to sitting on the side of a flat bed without using bedrails?: None Help needed moving to and from a bed to a chair (including a wheelchair)?: None Help needed standing up from a chair using your arms (e.g., wheelchair or bedside chair)?: None Help needed to walk in hospital room?: None Help needed climbing 3-5 steps with a railing? : None 6 Click Score: 24    End of Session   Activity Tolerance: Patient tolerated treatment well Patient left: with call bell/phone within reach;in chair Nurse Communication: Other (comment) (no PT needs) PT Visit Diagnosis: Other symptoms and signs involving the nervous system (U04.540)    Time: 9811-9147 PT Time Calculation (min) (ACUTE ONLY): 15 min   Charges:   PT Evaluation $PT Eval Low Complexity: 1 Low           Arby Barrette, PT Pager [phone]   Rexanne Mano 02/02/2020, 2:22 PM

## 2020-02-02 NOTE — Progress Notes (Signed)
Patient discharged home with family member. Verbalized understanding that he can't drive until 6 months + no seizure activity. No questions about medications or follow up appointments. Escorted to car by NT.

## 2020-02-06 ENCOUNTER — Other Ambulatory Visit: Payer: Self-pay | Admitting: Neurological Surgery

## 2020-02-06 ENCOUNTER — Other Ambulatory Visit: Payer: Self-pay | Admitting: Radiation Therapy

## 2020-02-06 ENCOUNTER — Other Ambulatory Visit (HOSPITAL_COMMUNITY): Payer: Self-pay | Admitting: Neurological Surgery

## 2020-02-06 DIAGNOSIS — G939 Disorder of brain, unspecified: Secondary | ICD-10-CM

## 2020-02-07 ENCOUNTER — Other Ambulatory Visit: Payer: Self-pay | Admitting: Neurological Surgery

## 2020-02-08 NOTE — Pre-Procedure Instructions (Addendum)
John Watson  02/08/2020     Your procedure is scheduled on Wednesday, September 15..  Report to [facility], Main Entrance or Entrance "A" at 6:30 AM                            Your surgery or procedure is scheduled to begin at 8:35 A.M.   Call this number if you have problems the morning of surgery: [phone]  This is the number for the Pre- Surgical Desk.         For any other questions, please call [phone], Monday - Friday 8 AM - 4 PM.   Remember:  Do not eat or drink after midnight Tuesday, September 14.    Take these medicines the morning of surgery with A SIP OF WATER:             atorvastatin (LIPITOR)   levETIRAcetam (KEPPRA)  STOP taking Aspirin, Aspirin Products (Goody Powder, Excedrin Migraine), Ibuprofen (Advil), Naproxen (Aleve), Vitamins and Herbal Products (ie Fish Oil).   Special instructions:   [name]- Preparing For Surgery  Before surgery, you can play an important role. Because skin is not sterile, your skin needs to be as free of germs as possible. You can reduce the number of germs on your skin by washing with CHG (chlorahexidine gluconate) Soap before surgery.  CHG is an antiseptic cleaner which kills germs and bonds with the skin to continue killing germs even after washing.    Oral Hygiene is also important to reduce your risk of infection.  Remember - BRUSH YOUR TEETH THE MORNING OF SURGERY WITH YOUR REGULAR TOOTHPASTE  Please do not use if you have an allergy to CHG or antibacterial soaps. If your skin becomes reddened/irritated stop using the CHG.  Do not shave (including legs and underarms) for at least 48 hours prior to first CHG shower. It is OK to shave your face.  Please follow these instructions carefully.   1. Shower the NIGHT BEFORE SURGERY and the MORNING OF SURGERY with CHG.   2. If you chose to wash your hair, wash your hair first as usual with your normal shampoo.  3. After you  wash your face and private area  with the soap you use at home, then rinse your hair and body thoroughly to remove the shampoo and soap.  4. Use CHG as you would any other liquid soap. You can apply CHG directly to the skin and wash gently with a scrungie or a clean washcloth.   5. Apply the CHG Soap to your body ONLY FROM THE NECK DOWN.  Do not use on open wounds or open sores. Avoid contact with your eyes, ears, mouth and genitals (private parts).   6. Wash thoroughly, paying special attention to the area where your surgery will be performed.  7. Thoroughly rinse your body with warm water from the neck down.  8. DO NOT shower/wash with your normal soap after using and rinsing off the CHG Soap.  9. Pat yourself dry with a CLEAN TOWEL.  10. Wear CLEAN PAJAMAS to bed the night before surgery, wear comfortable clothes the morning of surgery  11. Place CLEAN SHEETS on your bed the night of your first shower and DO NOT SLEEP WITH PETS.  Day of Surgery: Shower as instructed above. Do no t apply any deodorants/lotions, powders or colognes..  Please wear clean clothes to the hospital/surgery center.  Remember to brush your teeth WITH YOUR REGULAR TOOTHPASTE.  Do not wear jewelry, make-up or nail polish.             Men may shave face and neck.  Do not bring valuables to the hospital.  [facility] is not responsible for any belongings or valuables.  Contacts, dentures or bridgework may not be worn into surgery.  Leave your suitcase in the car.  After surgery it may be brought to your room.  For patients admitted to the hospital, discharge time will be determined by your treatment team.  Patients discharged the day of surgery will not be allowed to drive home.   Please read over the fact sheets that you were given.

## 2020-02-10 ENCOUNTER — Other Ambulatory Visit: Payer: Self-pay

## 2020-02-10 ENCOUNTER — Ambulatory Visit (HOSPITAL_COMMUNITY)
Admission: RE | Admit: 2020-02-10 | Discharge: 2020-02-10 | Disposition: A | Discharge status: Home / Self Care | Payer: Self-pay | Source: Ambulatory Visit | Attending: Neurological Surgery | Admitting: Neurological Surgery

## 2020-02-10 DIAGNOSIS — G939 Disorder of brain, unspecified: Secondary | ICD-10-CM | POA: Insufficient documentation

## 2020-02-10 MED ORDER — GADOBUTROL 1 MMOL/ML IV SOLN
9.0000 mL | Freq: Once | INTRAVENOUS | Status: AC | PRN
  Administered 2020-02-10: 9 mL via INTRAVENOUS

## 2020-02-11 ENCOUNTER — Other Ambulatory Visit: Payer: Self-pay

## 2020-02-11 ENCOUNTER — Encounter (HOSPITAL_COMMUNITY): Payer: Self-pay

## 2020-02-11 ENCOUNTER — Encounter (HOSPITAL_COMMUNITY)
Admission: RE | Admit: 2020-02-11 | Discharge: 2020-02-11 | Disposition: A | Discharge status: Home / Self Care | Payer: Self-pay | Source: Ambulatory Visit | Attending: Neurological Surgery | Admitting: Neurological Surgery

## 2020-02-11 ENCOUNTER — Other Ambulatory Visit: Payer: Self-pay | Admitting: Radiation Therapy

## 2020-02-11 ENCOUNTER — Other Ambulatory Visit (HOSPITAL_COMMUNITY)
Admission: RE | Admit: 2020-02-11 | Discharge: 2020-02-11 | Disposition: A | Discharge status: Home / Self Care | Payer: HRSA Program | Source: Ambulatory Visit | Attending: Neurological Surgery | Admitting: Neurological Surgery

## 2020-02-11 ENCOUNTER — Other Ambulatory Visit: Payer: Self-pay | Admitting: Neurological Surgery

## 2020-02-11 DIAGNOSIS — Z20822 Contact with and (suspected) exposure to covid-19: Secondary | ICD-10-CM | POA: Insufficient documentation

## 2020-02-11 DIAGNOSIS — Z01812 Encounter for preprocedural laboratory examination: Secondary | ICD-10-CM | POA: Insufficient documentation

## 2020-02-11 HISTORY — DX: Unspecified convulsions: R56.9

## 2020-02-11 LAB — TYPE AND SCREEN
ABO/RH(D): O POS
Antibody Screen: NEGATIVE

## 2020-02-11 LAB — SARS CORONAVIRUS 2 (TAT 6-24 HRS): SARS Coronavirus 2: NEGATIVE

## 2020-02-11 NOTE — Progress Notes (Signed)
I called and left a voice message for [facility], Dr. Orvan Seen scheduler, I asked if brain should be added to  procedure on OR [place]- "stereotatic biopsy of right sided lesion".

## 2020-02-11 NOTE — Progress Notes (Signed)
PCP - none - will be new patient at [facility] - was seen by Dr Levada Dy while in the hospital-   Chest x-ray - na  EKG - 02/05/20  Stress Test -   ECHO - 01/17/20  Cardiac Cath - no  LOOP Recorder- 01/17/20  Sleep Study - no CPAP - no  LABS- T/S- Had CBC, CMP - 02/02/20  ASA-no  ERAS-no  HA1C-na Fasting Blood Sugar - na Checks Blood Sugar ____na_ times a day  Anesthesia-  Pt denies having chest pain, sob, or fever at this time. All instructions explained to the pt, with a verbal understanding of the material. Pt agrees to go over the instructions while at home for a better understanding. Pt also instructed to self quarantine after being tested for COVID-19. The opportunity to ask questions was provided.

## 2020-02-13 ENCOUNTER — Inpatient Hospital Stay (HOSPITAL_COMMUNITY): Payer: Self-pay

## 2020-02-13 ENCOUNTER — Inpatient Hospital Stay (HOSPITAL_COMMUNITY): Payer: Self-pay | Admitting: Anesthesiology

## 2020-02-13 ENCOUNTER — Encounter (HOSPITAL_COMMUNITY): Payer: Self-pay | Admitting: Neurological Surgery

## 2020-02-13 ENCOUNTER — Other Ambulatory Visit: Payer: Self-pay

## 2020-02-13 ENCOUNTER — Inpatient Hospital Stay (HOSPITAL_COMMUNITY)
Admission: RE | Admit: 2020-02-13 | Discharge: 2020-02-14 | DRG: 026 | Disposition: A | Discharge status: Home / Self Care | Payer: Self-pay | Attending: Neurological Surgery | Admitting: Neurological Surgery

## 2020-02-13 ENCOUNTER — Inpatient Hospital Stay (HOSPITAL_COMMUNITY): Payer: Self-pay | Admitting: Emergency Medicine

## 2020-02-13 ENCOUNTER — Encounter (HOSPITAL_COMMUNITY)
Admission: RE | Disposition: A | Discharge status: Home / Self Care | Payer: Self-pay | Source: Home / Self Care | Attending: Neurological Surgery

## 2020-02-13 DIAGNOSIS — Z83438 Family history of other disorder of lipoprotein metabolism and other lipidemia: Secondary | ICD-10-CM

## 2020-02-13 DIAGNOSIS — Z8249 Family history of ischemic heart disease and other diseases of the circulatory system: Secondary | ICD-10-CM

## 2020-02-13 DIAGNOSIS — Z801 Family history of malignant neoplasm of trachea, bronchus and lung: Secondary | ICD-10-CM

## 2020-02-13 DIAGNOSIS — C719 Malignant neoplasm of brain, unspecified: Secondary | ICD-10-CM

## 2020-02-13 DIAGNOSIS — Z79899 Other long term (current) drug therapy: Secondary | ICD-10-CM

## 2020-02-13 DIAGNOSIS — G40909 Epilepsy, unspecified, not intractable, without status epilepticus: Secondary | ICD-10-CM | POA: Diagnosis present

## 2020-02-13 DIAGNOSIS — G939 Disorder of brain, unspecified: Secondary | ICD-10-CM | POA: Diagnosis present

## 2020-02-13 DIAGNOSIS — I69354 Hemiplegia and hemiparesis following cerebral infarction affecting left non-dominant side: Secondary | ICD-10-CM

## 2020-02-13 DIAGNOSIS — Z87891 Personal history of nicotine dependence: Secondary | ICD-10-CM

## 2020-02-13 DIAGNOSIS — C711 Malignant neoplasm of frontal lobe: Principal | ICD-10-CM | POA: Diagnosis present

## 2020-02-13 DIAGNOSIS — I96 Gangrene, not elsewhere classified: Secondary | ICD-10-CM | POA: Diagnosis present

## 2020-02-13 HISTORY — PX: FRAMELESS  BIOPSY WITH BRAINLAB: SHX6879

## 2020-02-13 HISTORY — PX: APPLICATION OF CRANIAL NAVIGATION: SHX6578

## 2020-02-13 HISTORY — DX: Malignant neoplasm of brain, unspecified: C71.9

## 2020-02-13 LAB — CBC
HCT: 40.1 % (ref 39.0–52.0)
Hemoglobin: 12.5 g/dL — ABNORMAL LOW (ref 13.0–17.0)
MCH: 29.3 pg (ref 26.0–34.0)
MCHC: 31.2 g/dL (ref 30.0–36.0)
MCV: 93.9 fL (ref 80.0–100.0)
Platelets: 276 10*3/uL (ref 150–400)
RBC: 4.27 MIL/uL (ref 4.22–5.81)
RDW: 11.7 % (ref 11.5–15.5)
WBC: 5.1 10*3/uL (ref 4.0–10.5)
nRBC: 0 % (ref 0.0–0.2)

## 2020-02-13 LAB — BASIC METABOLIC PANEL
Anion gap: 7 (ref 5–15)
BUN: 9 mg/dL (ref 6–20)
CO2: 26 mmol/L (ref 22–32)
Calcium: 9.5 mg/dL (ref 8.9–10.3)
Chloride: 107 mmol/L (ref 98–111)
Creatinine, Ser: 1.14 mg/dL (ref 0.61–1.24)
GFR calc Af Amer: >60 mL/min (ref >60)
GFR calc non Af Amer: >60 mL/min (ref >60)
Glucose, Bld: 99 mg/dL (ref 70–99)
Potassium: 4.1 mmol/L (ref 3.5–5.1)
Sodium: 140 mmol/L (ref 135–145)

## 2020-02-13 LAB — PROTIME-INR
INR: 1.1 (ref 0.8–1.2)
Prothrombin Time: 13.6 seconds (ref 11.4–15.2)

## 2020-02-13 LAB — ABO/RH: ABO/RH(D): O POS

## 2020-02-13 LAB — APTT: aPTT: 32 seconds (ref 24–36)

## 2020-02-13 SURGERY — FRAMELESS BIOPSY WITH BRAINLAB
Anesthesia: General | Site: Head | Laterality: Right

## 2020-02-13 MED ORDER — PROPOFOL 10 MG/ML IV BOLUS
INTRAVENOUS | Status: AC
  Filled 2020-02-13: qty 40

## 2020-02-13 MED ORDER — BUPIVACAINE HCL (PF) 0.5 % IJ SOLN
INTRAMUSCULAR | Status: AC
  Filled 2020-02-13: qty 30

## 2020-02-13 MED ORDER — BACITRACIN ZINC 500 UNIT/GM EX OINT
TOPICAL_OINTMENT | CUTANEOUS | Status: AC
  Filled 2020-02-13: qty 28.35

## 2020-02-13 MED ORDER — BACITRACIN ZINC 500 UNIT/GM EX OINT
TOPICAL_OINTMENT | CUTANEOUS | Status: DC | PRN
  Administered 2020-02-13 (×2): 1 via TOPICAL

## 2020-02-13 MED ORDER — CHLORHEXIDINE GLUCONATE CLOTH 2 % EX PADS: 6.0000 | MEDICATED_PAD | Freq: Once | CUTANEOUS | Status: DC

## 2020-02-13 MED ORDER — ACETAMINOPHEN 160 MG/5ML PO SOLN: 325.0000 mg | ORAL | Status: DC | PRN

## 2020-02-13 MED ORDER — 0.9 % SODIUM CHLORIDE (POUR BTL) OPTIME
TOPICAL | Status: DC | PRN
  Administered 2020-02-13 (×2): 1000 mL

## 2020-02-13 MED ORDER — ONDANSETRON HCL 4 MG/2ML IJ SOLN: 4.0000 mg | Freq: Once | INTRAMUSCULAR | Status: DC | PRN

## 2020-02-13 MED ORDER — BUPIVACAINE-EPINEPHRINE (PF) 0.5% -1:200000 IJ SOLN
INTRAMUSCULAR | Status: DC | PRN
  Administered 2020-02-13: 3 mL

## 2020-02-13 MED ORDER — OXYCODONE HCL 5 MG/5ML PO SOLN: 5.0000 mg | Freq: Once | ORAL | Status: DC | PRN

## 2020-02-13 MED ORDER — BUPIVACAINE-EPINEPHRINE 0.5% -1:200000 IJ SOLN
INTRAMUSCULAR | Status: AC
  Filled 2020-02-13: qty 1

## 2020-02-13 MED ORDER — CEFAZOLIN SODIUM-DEXTROSE 2-4 GM/100ML-% IV SOLN
2.0000 g | INTRAVENOUS | Status: AC
  Filled 2020-02-13: qty 100

## 2020-02-13 MED ORDER — LIDOCAINE-EPINEPHRINE 1 %-1:100000 IJ SOLN
INTRAMUSCULAR | Status: AC
  Filled 2020-02-13: qty 1

## 2020-02-13 MED ORDER — MIDAZOLAM HCL 2 MG/2ML IJ SOLN
INTRAMUSCULAR | Status: AC
  Filled 2020-02-13: qty 2

## 2020-02-13 MED ORDER — HEMOSTATIC AGENTS (NO CHARGE) OPTIME
TOPICAL | Status: DC | PRN
  Administered 2020-02-13: 1

## 2020-02-13 MED ORDER — LACTATED RINGERS IV SOLN: INTRAVENOUS | Status: DC

## 2020-02-13 MED ORDER — LIDOCAINE HCL (CARDIAC) PF 100 MG/5ML IV SOSY
PREFILLED_SYRINGE | INTRAVENOUS | Status: DC | PRN
  Administered 2020-02-13: 30 mg via INTRAVENOUS

## 2020-02-13 MED ORDER — ONDANSETRON HCL 4 MG/2ML IJ SOLN: 4.0000 mg | INTRAMUSCULAR | Status: DC | PRN

## 2020-02-13 MED ORDER — ROCURONIUM BROMIDE 10 MG/ML (PF) SYRINGE
PREFILLED_SYRINGE | INTRAVENOUS | Status: DC | PRN
  Administered 2020-02-13: 10 mg via INTRAVENOUS
  Administered 2020-02-13: 60 mg via INTRAVENOUS

## 2020-02-13 MED ORDER — ATORVASTATIN CALCIUM 80 MG PO TABS
80.0000 mg | ORAL_TABLET | Freq: Every day | ORAL | Status: DC
  Administered 2020-02-13 – 2020-02-14 (×2): 80 mg via ORAL
  Filled 2020-02-13 (×2): qty 1

## 2020-02-13 MED ORDER — PROMETHAZINE HCL 25 MG PO TABS: 12.5000 mg | ORAL_TABLET | ORAL | Status: DC | PRN

## 2020-02-13 MED ORDER — ACETAMINOPHEN 650 MG RE SUPP: 650.0000 mg | RECTAL | Status: DC | PRN

## 2020-02-13 MED ORDER — DEXAMETHASONE SODIUM PHOSPHATE 10 MG/ML IJ SOLN
INTRAMUSCULAR | Status: DC | PRN
  Administered 2020-02-13: 10 mg via INTRAVENOUS

## 2020-02-13 MED ORDER — PANTOPRAZOLE SODIUM 40 MG IV SOLR
40.0000 mg | Freq: Every day | INTRAVENOUS | Status: DC
  Administered 2020-02-13: 40 mg via INTRAVENOUS
  Filled 2020-02-13: qty 40

## 2020-02-13 MED ORDER — ORAL CARE MOUTH RINSE: 15.0000 mL | Freq: Once | OROMUCOSAL | Status: AC

## 2020-02-13 MED ORDER — HYDROCODONE-ACETAMINOPHEN 5-325 MG PO TABS
1.0000 | ORAL_TABLET | ORAL | Status: DC | PRN
  Administered 2020-02-13: 1 via ORAL
  Filled 2020-02-13: qty 1

## 2020-02-13 MED ORDER — THROMBIN 20000 UNITS EX SOLR
CUTANEOUS | Status: AC
  Filled 2020-02-13: qty 20000

## 2020-02-13 MED ORDER — PHENYLEPHRINE HCL-NACL 10-0.9 MG/250ML-% IV SOLN
INTRAVENOUS | Status: DC | PRN
  Administered 2020-02-13: 50 ug/min via INTRAVENOUS

## 2020-02-13 MED ORDER — THROMBIN 5000 UNITS EX SOLR
OROMUCOSAL | Status: DC | PRN
  Administered 2020-02-13: 5 mL

## 2020-02-13 MED ORDER — SODIUM CHLORIDE 0.9 % IV SOLN: INTRAVENOUS | Status: DC

## 2020-02-13 MED ORDER — MIDAZOLAM HCL 5 MG/5ML IJ SOLN
INTRAMUSCULAR | Status: DC | PRN
  Administered 2020-02-13: 2 mg via INTRAVENOUS

## 2020-02-13 MED ORDER — ONDANSETRON HCL 4 MG PO TABS
4.0000 mg | ORAL_TABLET | ORAL | Status: DC | PRN
  Administered 2020-02-13 – 2020-02-14 (×2): 4 mg via ORAL
  Filled 2020-02-13 (×2): qty 1

## 2020-02-13 MED ORDER — OXYCODONE HCL 5 MG PO TABS: 5.0000 mg | ORAL_TABLET | Freq: Once | ORAL | Status: DC | PRN

## 2020-02-13 MED ORDER — PROPOFOL 10 MG/ML IV BOLUS
INTRAVENOUS | Status: DC | PRN
  Administered 2020-02-13: 180 mg via INTRAVENOUS

## 2020-02-13 MED ORDER — ACETAMINOPHEN 325 MG PO TABS: 325.0000 mg | ORAL_TABLET | ORAL | Status: DC | PRN

## 2020-02-13 MED ORDER — THROMBIN 5000 UNITS EX SOLR
CUTANEOUS | Status: AC
  Filled 2020-02-13: qty 5000

## 2020-02-13 MED ORDER — LIDOCAINE-EPINEPHRINE 1 %-1:100000 IJ SOLN
INTRAMUSCULAR | Status: DC | PRN
  Administered 2020-02-13: 3 mL

## 2020-02-13 MED ORDER — ACETAMINOPHEN 325 MG PO TABS
650.0000 mg | ORAL_TABLET | ORAL | Status: DC | PRN
  Administered 2020-02-13 – 2020-02-14 (×5): 650 mg via ORAL
  Filled 2020-02-13 (×6): qty 2

## 2020-02-13 MED ORDER — LEVETIRACETAM 500 MG PO TABS
1000.0000 mg | ORAL_TABLET | Freq: Two times a day (BID) | ORAL | Status: DC
  Administered 2020-02-13 – 2020-02-14 (×2): 1000 mg via ORAL
  Filled 2020-02-13 (×2): qty 2

## 2020-02-13 MED ORDER — PHENYLEPHRINE HCL (PRESSORS) 10 MG/ML IV SOLN
INTRAVENOUS | Status: DC | PRN
  Administered 2020-02-13 (×2): 100 ug via INTRAVENOUS

## 2020-02-13 MED ORDER — THROMBIN 20000 UNITS EX KIT
PACK | CUTANEOUS | Status: DC | PRN
  Administered 2020-02-13: 20 mL via TOPICAL

## 2020-02-13 MED ORDER — FENTANYL CITRATE (PF) 250 MCG/5ML IJ SOLN
INTRAMUSCULAR | Status: AC
  Filled 2020-02-13: qty 5

## 2020-02-13 MED ORDER — LACTATED RINGERS IV SOLN: INTRAVENOUS | Status: DC | PRN

## 2020-02-13 MED ORDER — ONDANSETRON HCL 4 MG/2ML IJ SOLN
INTRAMUSCULAR | Status: DC | PRN
  Administered 2020-02-13: 4 mg via INTRAVENOUS

## 2020-02-13 MED ORDER — LABETALOL HCL 5 MG/ML IV SOLN: 10.0000 mg | INTRAVENOUS | Status: DC | PRN

## 2020-02-13 MED ORDER — SODIUM CHLORIDE 0.9 % IV SOLN: INTRAVENOUS | Status: DC | PRN

## 2020-02-13 MED ORDER — FENTANYL CITRATE (PF) 100 MCG/2ML IJ SOLN
INTRAMUSCULAR | Status: DC | PRN
Start: 2020-02-13 — End: 2020-02-13
  Administered 2020-02-13: 150 ug via INTRAVENOUS

## 2020-02-13 MED ORDER — CHLORHEXIDINE GLUCONATE 0.12 % MT SOLN: 15.0000 mL | Freq: Once | OROMUCOSAL | Status: AC

## 2020-02-13 MED ORDER — MORPHINE SULFATE (PF) 2 MG/ML IV SOLN: 1.0000 mg | INTRAVENOUS | Status: DC | PRN

## 2020-02-13 MED ORDER — SUGAMMADEX SODIUM 200 MG/2ML IV SOLN
INTRAVENOUS | Status: DC | PRN
  Administered 2020-02-13: 300 mg via INTRAVENOUS

## 2020-02-13 MED ORDER — CHLORHEXIDINE GLUCONATE 0.12 % MT SOLN
OROMUCOSAL | Status: AC
  Administered 2020-02-13: 15 mL via OROMUCOSAL
  Filled 2020-02-13: qty 15

## 2020-02-13 MED ORDER — CEFAZOLIN SODIUM-DEXTROSE 1-4 GM/50ML-% IV SOLN
1.0000 g | Freq: Three times a day (TID) | INTRAVENOUS | Status: AC
  Administered 2020-02-13 (×2): 1 g via INTRAVENOUS
  Filled 2020-02-13 (×2): qty 50

## 2020-02-13 MED ORDER — HYDROMORPHONE HCL 1 MG/ML IJ SOLN: 0.2500 mg | INTRAMUSCULAR | Status: DC | PRN

## 2020-02-13 SURGICAL SUPPLY — 86 items
BAND INSRT 18 STRL LF DISP RB (MISCELLANEOUS) ×2
BAND RUBBER #18 3X1/16 STRL (MISCELLANEOUS) ×4 IMPLANT
BIT DRILL SHORT 3.0X30 (BIT) ×1 IMPLANT
BIT DRILL WIRE PASS 1.3MM (BIT) IMPLANT
BLADE CLIPPER SURG (BLADE) ×2 IMPLANT
BUR CARBIDE MATCH 3.0 (BURR) ×2 IMPLANT
BUR SPIRAL ROUTER 2.3 (BUR) ×2 IMPLANT
CANISTER SUCT 3000ML PPV (MISCELLANEOUS) ×2 IMPLANT
CARTRIDGE OIL MAESTRO DRILL (MISCELLANEOUS) ×1 IMPLANT
CNTNR URN SCR LID CUP LEK RST (MISCELLANEOUS) IMPLANT
CONT SPEC 4OZ STRL OR WHT (MISCELLANEOUS) ×2
COVER WAND RF STERILE (DRAPES) ×2 IMPLANT
DIFFUSER DRILL AIR PNEUMATIC (MISCELLANEOUS) ×2 IMPLANT
DRAIN JACKSON PRATT 1/4 1325 (MISCELLANEOUS) IMPLANT
DRAPE MICROSCOPE LEICA (MISCELLANEOUS) ×2 IMPLANT
DRAPE NEUROLOGICAL W/INCISE (DRAPES) ×2 IMPLANT
DRAPE SHEET LG 3/4 BI-LAMINATE (DRAPES) ×2 IMPLANT
DRAPE SURG 17X23 STRL (DRAPES) IMPLANT
DRAPE WARM FLUID 44X44 (DRAPES) ×2 IMPLANT
DRILL WIRE PASS 1.3MM (BIT)
DRSG TELFA 3X8 NADH (GAUZE/BANDAGES/DRESSINGS) ×2 IMPLANT
DURAPREP 6ML APPLICATOR 50/CS (WOUND CARE) ×2 IMPLANT
ELECT REM PT RETURN 9FT ADLT (ELECTROSURGICAL) ×2
ELECTRODE REM PT RTRN 9FT ADLT (ELECTROSURGICAL) ×1 IMPLANT
EVACUATOR SILICONE 100CC (DRAIN) IMPLANT
FORCEPS BIPO MALIS IRRIG 9X1.5 (NEUROSURGERY SUPPLIES) ×2 IMPLANT
GAUZE 4X4 16PLY RFD (DISPOSABLE) IMPLANT
GAUZE SPONGE 4X4 12PLY STRL (GAUZE/BANDAGES/DRESSINGS) IMPLANT
GLOVE BIOGEL PI IND STRL 8 (GLOVE) ×2 IMPLANT
GLOVE BIOGEL PI INDICATOR 8 (GLOVE) ×2
GLOVE ECLIPSE 8.0 STRL XLNG CF (GLOVE) ×4 IMPLANT
GLOVE EXAM NITRILE XL STR (GLOVE) IMPLANT
GOWN STRL REUS W/ TWL LRG LVL3 (GOWN DISPOSABLE) IMPLANT
GOWN STRL REUS W/ TWL XL LVL3 (GOWN DISPOSABLE) ×1 IMPLANT
GOWN STRL REUS W/TWL 2XL LVL3 (GOWN DISPOSABLE) IMPLANT
GOWN STRL REUS W/TWL LRG LVL3 (GOWN DISPOSABLE)
GOWN STRL REUS W/TWL XL LVL3 (GOWN DISPOSABLE) ×2
HEMOSTAT POWDER KIT SURGIFOAM (HEMOSTASIS) ×2 IMPLANT
HEMOSTAT SURGICEL 2X14 (HEMOSTASIS) ×2 IMPLANT
IV NS 1000ML (IV SOLUTION) ×2
IV NS 1000ML BAXH (IV SOLUTION) ×1 IMPLANT
KIT BASIN OR (CUSTOM PROCEDURE TRAY) ×2 IMPLANT
KIT TURNOVER KIT B (KITS) ×2 IMPLANT
KIT VARIOGUIDE DRILL 1.9 (KITS) ×1 IMPLANT
MARKER SPHERE PSV REFLC 13MM (MARKER) ×6 IMPLANT
NDL SPNL 22GX3.5 QUINCKE BK (NEEDLE) IMPLANT
NEEDLE HYPO 22GX1.5 SAFETY (NEEDLE) ×2 IMPLANT
NEEDLE SPNL 22GX3.5 QUINCKE BK (NEEDLE) ×2 IMPLANT
NS IRRIG 1000ML POUR BTL (IV SOLUTION) ×2 IMPLANT
OIL CARTRIDGE MAESTRO DRILL (MISCELLANEOUS) ×2
PACK CRANIOTOMY CUSTOM (CUSTOM PROCEDURE TRAY) ×2 IMPLANT
PAD DRESSING TELFA 3X8 NADH (GAUZE/BANDAGES/DRESSINGS) IMPLANT
PATTIES SURGICAL .5 X.5 (GAUZE/BANDAGES/DRESSINGS) IMPLANT
PATTIES SURGICAL .5 X3 (DISPOSABLE) IMPLANT
PATTIES SURGICAL 1X1 (DISPOSABLE) IMPLANT
PERFORATOR LRG  14-11MM (BIT) ×2
PERFORATOR LRG 14-11MM (BIT) ×1 IMPLANT
PIN MAYFIELD SKULL DISP (PIN) ×2 IMPLANT
RETRACTOR LONE STAR DISPOSABLE (INSTRUMENTS) ×4 IMPLANT
SET CARTRIDGE AND TUBING (SET/KITS/TRAYS/PACK) IMPLANT
SET TUBING IRRIGATION DISP (TUBING) ×2 IMPLANT
SPONGE NEURO XRAY DETECT 1X3 (DISPOSABLE) IMPLANT
SPONGE SURGIFOAM ABS GEL 100 (HEMOSTASIS) ×2 IMPLANT
STAPLER VISISTAT 35W (STAPLE) ×2 IMPLANT
STOCKINETTE 6  STRL (DRAPES) ×2
STOCKINETTE 6 STRL (DRAPES) ×1 IMPLANT
STRIP CLOSURE SKIN 1/2X4 (GAUZE/BANDAGES/DRESSINGS) ×2 IMPLANT
SUT ETHILON 3 0 FSL (SUTURE) IMPLANT
SUT ETHILON 3 0 PS 1 (SUTURE) IMPLANT
SUT MNCRL AB 3-0 PS2 18 (SUTURE) ×1 IMPLANT
SUT NURALON 4 0 TR CR/8 (SUTURE) ×6 IMPLANT
SUT VIC AB 0 CT1 18XCR BRD8 (SUTURE) ×1 IMPLANT
SUT VIC AB 0 CT1 8-18 (SUTURE) ×2
SUT VIC AB 2-0 CP2 18 (SUTURE) ×2 IMPLANT
SUT VICRYL RAPIDE 4/0 PS 2 (SUTURE) ×2 IMPLANT
SYR 5ML LL (SYRINGE) ×2 IMPLANT
SYR 5ML LUER SLIP (SYRINGE) ×1 IMPLANT
SYR BULB IRRIG 60ML STRL (SYRINGE) ×2 IMPLANT
TIP SHEAR CVD EXTENDED 36KH (INSTRUMENTS) IMPLANT
TOWEL GREEN STERILE (TOWEL DISPOSABLE) ×2 IMPLANT
TOWEL GREEN STERILE FF (TOWEL DISPOSABLE) ×2 IMPLANT
TRAY FOLEY MTR SLVR 16FR STAT (SET/KITS/TRAYS/PACK) ×2 IMPLANT
TUBE CONNECTING 12X1/4 (SUCTIONS) ×2 IMPLANT
UNDERPAD 30X36 HEAVY ABSORB (UNDERPADS AND DIAPERS) ×2 IMPLANT
WATER STERILE IRR 1000ML POUR (IV SOLUTION) ×2 IMPLANT
WRENCH TORQUE 36KHZ (INSTRUMENTS) IMPLANT

## 2020-02-13 NOTE — Anesthesia Postprocedure Evaluation (Signed)
Anesthesia Post Note  Patient: John Watson  Procedure(s) Performed: STEREOTACTIC BRAIN BIOPSY OF RIGHT SIDED LESION (Right Head) APPLICATION OF CRANIAL NAVIGATION (Right Head)     Patient location during evaluation: PACU Anesthesia Type: General Level of consciousness: awake and alert Pain management: pain level controlled Vital Signs Assessment: post-procedure vital signs reviewed and stable Respiratory status: spontaneous breathing, nonlabored ventilation, respiratory function stable and patient connected to nasal cannula oxygen Cardiovascular status: blood pressure returned to baseline and stable Postop Assessment: no apparent nausea or vomiting Anesthetic complications: no   No complications documented.  Last Vitals:  Vitals:   02/13/20 1154 02/13/20 1200  BP: (!) 144/79 (!) 144/90  Pulse: 96 96  Resp: 14 16  Temp: (!) 36.2 C (!) 36.2 C  SpO2: 98% 94%    Last Pain:  Vitals:   02/13/20 1154  TempSrc:   PainSc: 2                  Belenda Cruise P Jadin Kagel

## 2020-02-13 NOTE — Evaluation (Signed)
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 Pt presented last month with seizures and was found to have multifocal right frontal and parietal lesion.  Repeat imaging after coming back to the hospital with recurrent seizures showed progression of the right medial frontal lesion with more enhancement and mass-effect. Pt underwent needle biopsy of R frontal and parietal lesions on 02/13/2020.  Clinical Impression  Pt presents to PT with minor deficits in L strength, as well as dynamic gait/balance tasks. Pt is able to perform all mobility at a modI level this session however he does report feeling unsteady and not at his baseline. Pt is very active at baseline and works for [employer] for [employer]. Pt is encouraged to ambulate out of the room multiple times a day to maintain his current level of function. PT also recommends outpatient PT consult at the time of discharge to aide in improving dynamic balance and gait tasks to aide in a safe return to work. Pt requires no further acute PT services at this time.    Follow Up Recommendations Outpatient PT (for high level balance/gait training)    Equipment Recommendations  None recommended by PT    Recommendations for Other Services       Precautions / Restrictions Precautions Precautions: Fall;Other (comment) (seizure) Restrictions Weight Bearing Restrictions: No      Mobility  Bed Mobility Overal bed mobility: Modified Independent Bed Mobility: Supine to Sit     Supine to sit: Modified independent (Device/Increase time)     General bed mobility comments: increased time  Transfers Overall transfer level: Independent Equipment used: None Transfers: Sit to/from Stand Sit to Stand: Independent             Ambulation/Gait Ambulation/Gait assistance: Independent Gait Distance (Feet): 300 Feet Assistive device: None Gait Pattern/deviations: Step-through pattern Gait velocity: functional Gait velocity interpretation: 1.31 - 2.62 ft/sec, indicative of limited community ambulator General Gait Details: pt with steady step through gait, able to perform head turns, change gait speed, stop abruptly, and turn quickly without Loss of balance  Stairs Stairs: Yes Stairs assistance: Modified independent (Device/Increase time) Stair Management: One rail Left Number of Stairs: 4 General stair comments: 2 trials ascending and descending 4 steps. Pt ascends with alternating pattern and descends with step-to pattern  Wheelchair Mobility    Modified Rankin (Stroke Patients Only)       Balance Overall balance assessment: Mild deficits observed, not formally tested                                           Pertinent Vitals/Pain Pain Assessment: Faces Faces Pain Scale: Hurts little more Pain Location: head Pain Descriptors / Indicators: Aching Pain Intervention(s): Monitored during session    Home Living Family/patient expects to be discharged to:: Private residence Living Arrangements: Other relatives (brother and SIL) Available Help at Discharge: Family;Available 24 hours/day Type of Home: House Home Access: Stairs to enter Entrance Stairs-Rails: Psychiatric nurse of Steps: 6 Home Layout: One level Home Equipment: Grab bars - tub/shower      Prior Function Level of Independence: Independent         Comments: works in Water engineer at [employer]  Hand Dominance   Dominant Hand: Right    Extremity/Trunk Assessment   Upper Extremity Assessment Upper Extremity Assessment: Overall WFL for tasks assessed    Lower Extremity Assessment Lower Extremity Assessment: LLE deficits/detail LLE Deficits / Details: LLE grossly  4+/5    Cervical / Trunk Assessment Cervical / Trunk Assessment: Normal  Communication   Communication: No difficulties  Cognition Arousal/Alertness: Awake/alert Behavior During Therapy: WFL for tasks assessed/performed Overall Cognitive Status: Within Functional Limits for tasks assessed                                        General Comments General comments (skin integrity, edema, etc.): VSS on RA    Exercises     Assessment/Plan    PT Assessment Patent does not need any further PT services  PT Problem List         PT Treatment Interventions      PT Goals (Current goals can be found in the Care Plan section)       Frequency     Barriers to discharge        Co-evaluation               AM-PAC PT "6 Clicks" Mobility  Outcome Measure Help needed turning from your back to your side while in a flat bed without using bedrails?: None Help needed moving from lying on your back to sitting on the side of a flat bed without using bedrails?: None Help needed moving to and from a bed to a chair (including a wheelchair)?: None Help needed standing up from a chair using your arms (e.g., wheelchair or bedside chair)?: None Help needed to walk in hospital room?: None Help needed climbing 3-5 steps with a railing? : None 6 Click Score: 24    End of Session   Activity Tolerance: Patient tolerated treatment well Patient left: in chair;with call bell/phone within reach Nurse Communication: Mobility status      Time: 8882-8003 PT Time Calculation (min) (ACUTE ONLY): 24 min   Charges:   PT Evaluation $PT Eval Low Complexity: 1 Low          Zenaida Niece, PT, DPT Acute Rehabilitation Pager: [phone]   Zenaida Niece 02/13/2020, 3:17 PM

## 2020-02-13 NOTE — Progress Notes (Signed)
   Providing Compassionate, Quality Care - Together  NEUROSURGERY PROGRESS NOTE   S: seen in pacu, no complaints  O: EXAM:  BP (!) 142/89 (BP Location: Right Arm)   Pulse (!) 101   Temp (!) 97.2 F (36.2 C)   Resp 14   SpO2 98%   Awake, alert FCs x4  CNs grossly intact  Face symemtric Full strength BUE/BLE  Incision c/d/i  ASSESSMENT:  59 y.o. male with  1. Right frontal and parietal lesions 2. Szs  S/p R frontal biopsy on 02/13/2020  PLAN: - NSICU - postop CT - prelim necrotic tissue - continue keppra - pain control - pt/ot      Thank you for allowing me to participate in this patient's care.  Please do not hesitate to call with questions or concerns.   Elwin Sleight, [facility] Cell: [phone]

## 2020-02-13 NOTE — Transfer of Care (Signed)
Immediate Anesthesia Transfer of Care Note  Patient: John Watson  Procedure(s) Performed: STEREOTACTIC BRAIN BIOPSY OF RIGHT SIDED LESION (Right Head) APPLICATION OF CRANIAL NAVIGATION (Right Head)  Patient Location: PACU  Anesthesia Type:General  Level of Consciousness: drowsy  Airway & Oxygen Therapy: Patient Spontanous Breathing  Post-op Assessment: Report given to RN and Post -op Vital signs reviewed and stable  Post vital signs: Reviewed and stable  Last Vitals:  Vitals Value Taken Time  BP 142/90 02/13/20 1124  Temp    Pulse 103 02/13/20 1125  Resp 14 02/13/20 1125  SpO2 99 % 02/13/20 1125  Vitals shown include unvalidated device data.  Last Pain:  Vitals:   02/13/20 0729  TempSrc:   PainSc: 0-No pain         Complications: No complications documented.

## 2020-02-13 NOTE — Anesthesia Preprocedure Evaluation (Signed)
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 Keppra taken DOS CVA, Residual Symptoms negative psych ROS   GI/Hepatic negative GI ROS, Neg liver ROS,   Endo/Other  negative endocrine ROS  Renal/GU negative Renal ROS  negative genitourinary   Musculoskeletal negative musculoskeletal ROS (+)   Abdominal Normal abdominal exam  (+)  Abdomen: soft. Bowel sounds: normal.  Peds negative pediatric ROS (+)  Hematology negative hematology ROS (+)   Anesthesia Other Findings   Reproductive/Obstetrics negative OB ROS                            Anesthesia Physical Anesthesia Plan  ASA: III  Anesthesia Plan: General   Post-op Pain Management:    Induction: Intravenous  PONV Risk Score and Plan: 2 and Ondansetron and Dexamethasone  Airway Management Planned: Oral ETT and Mask  Additional Equipment: None  Intra-op Plan:   Post-operative Plan: Extubation in OR  Informed Consent: I have reviewed the patients History and Physical, chart, labs and discussed the procedure including the risks, benefits and alternatives for the proposed anesthesia with the patient or authorized representative who has indicated his/her understanding and acceptance.       Plan Discussed with: CRNA and Surgeon  Anesthesia Plan Comments: (Lab Results      Component                Value               Date                      WBC                      5.5                 02/02/2020                 HGB                      12.4 (L)            02/02/2020                HCT                      38.6 (L)            02/02/2020                MCV                      92.6                02/02/2020                PLT                      328  02/02/2020           Covid-19 Nucleic Acid Test Results Lab Results      Component                Value               Date                      SARSCOV2NAA              NEGATIVE            02/11/2020                SARSCOV2NAA              NEGATIVE            02/01/2020                [place]              NEGATIVE            01/14/2020           Lab Results      Component                Value               Date                      WBC                      5.5                 02/02/2020                HGB                      12.4 (L)            02/02/2020                HCT                      38.6 (L)            02/02/2020                MCV                      92.6                02/02/2020                PLT                      328                 02/02/2020          )        Anesthesia Quick Evaluation

## 2020-02-13 NOTE — Anesthesia Procedure Notes (Signed)
Procedure Name: Intubation Date/Time: 02/13/2020 8:50 AM Performed by: Eligha Bridegroom, CRNA Pre-anesthesia Checklist: Patient identified, Emergency Drugs available, Suction available, Patient being monitored and Timeout performed Patient Re-evaluated:Patient Re-evaluated prior to induction Oxygen Delivery Method: Circle system utilized Preoxygenation: Pre-oxygenation with 100% oxygen Induction Type: IV induction Ventilation: Oral airway inserted - appropriate to patient size and Mask ventilation without difficulty Laryngoscope Size: Mac and 4 Grade View: Grade II Tube type: Oral Tube size: 7.5 mm Number of attempts: 1 Airway Equipment and Method: Stylet Placement Confirmation: ETT inserted through vocal cords under direct vision Secured at: 22 cm Tube secured with: Tape Dental Injury: Teeth and Oropharynx as per pre-operative assessment

## 2020-02-13 NOTE — H&P (Signed)
Providing Compassionate, Quality Care - Together  NEUROSURGERY HISTORY & PHYSICAL   John SEELMAN is an 59 y.o. male.   Chief Complaint: Brain lesion HPI: This is a 59 year old male with no significant past medical history, presented last month with seizures and was found to have multifocal right frontal and parietal lesion.  Repeat imaging after coming back to the hospital with recurrent seizures showed progression of the right medial frontal lesion with more enhancement and mass-effect.  He has no new complaints at this time.  Has been seizure-free on his Keppra dose.  Was previously on aspirin and Plavix for possible strokes, that has been stopped for 2 weeks.  Past Medical History:  Diagnosis Date  . Pneumonia 2012  . Seizure ([place]) 8/16/211   last one 02/02/2020  . Stroke [facility])    Left leg weakness    Past Surgical History:  Procedure Laterality Date  . BUBBLE STUDY  01/17/2020   Procedure: BUBBLE STUDY;  Surgeon: [name], MD;  Location: [facility] ENDOSCOPY;  Service: Cardiovascular;;  . LOOP RECORDER INSERTION N/A 01/17/2020   Procedure: LOOP RECORDER INSERTION;  Surgeon: [name], MD;  Location: [facility] CV LAB;  Service: Cardiovascular;  Laterality: N/A;  . TEE WITHOUT CARDIOVERSION N/A 01/17/2020   Procedure: TRANSESOPHAGEAL ECHOCARDIOGRAM (TEE);  Surgeon: [name], MD;  Location: [facility] ENDOSCOPY;  Service: Cardiovascular;  Laterality: N/A;    Family History  Problem Relation Age of Onset  . Hypertension Mother   . Lung cancer Father   . Hyperlipidemia Sister   . Hyperlipidemia Brother    Social History:  reports that he quit smoking about 19 years ago. His smoking use included cigars. He quit after 2.00 years of use. He has never used smokeless tobacco. He reports current alcohol use of about 1.0 standard drink of alcohol per week. He reports that he does not use drugs.  Allergies: No Known Allergies  Medications Prior to Admission   Medication Sig Dispense Refill  . atorvastatin (LIPITOR) 80 MG tablet Take 1 tablet (80 mg total) by mouth daily. 30 tablet 2  . levETIRAcetam (KEPPRA) 1000 MG tablet Take 1 tablet (1,000 mg total) by mouth 2 (two) times daily. 60 tablet 0    Results for orders placed or performed during the hospital encounter of 02/13/20 (from the past 48 hour(s))  CBC     Status: Abnormal   Collection Time: 02/13/20  6:50 AM  Result Value Ref Range   WBC 5.1 4.0 - 10.5 K/uL   RBC 4.27 4.22 - 5.81 MIL/uL   Hemoglobin 12.5 (L) 13.0 - 17.0 g/dL   HCT 40.1 39 - 52 %   MCV 93.9 80.0 - 100.0 fL   MCH 29.3 26.0 - 34.0 pg   MCHC 31.2 30.0 - 36.0 g/dL   RDW 11.7 11.5 - 15.5 %   Platelets 276 150 - 400 K/uL   nRBC 0.0 0.0 - 0.2 %    Comment: Performed at [facility], [place] [address], [place], [place] [address]   No results found.  ROS 14 point review of systems was obtained which all pertinent positives and eggs are listed in HPI above   Blood pressure 130/81, pulse 86, temperature 98.1 F (36.7 C), temperature source Oral, resp. rate 18, SpO2 99 %. Physical Exam  AO x3 PERRLA Cranial nerves II through XII intact Sensory to light touch intact Motor strength 5/5 bilateral upper and lower extremities Face symmetric EOMI   Assessment/Plan  59 year old male with  1.  Right frontal and parietal lesions 2.  Seizures  -OR today for needle biopsy of right frontal lesion, stereotactic -Continue Keppra -Hold aspirin Plavix   Thank you for allowing me to participate in this patient's care.  Please do not hesitate to call with questions or concerns.   Elwin Sleight, [facility] Cell: [phone]

## 2020-02-13 NOTE — Op Note (Signed)
DATE OF SERVICE: 02/13/2020  PREOP DIAGNOSIS:  1. Brain Mass, enhancing right frontal and non-enhancing right parietal 2. Seizures  POSTOP DIAGNOSIS: Same  PROCEDURE: 1. Stereotactic right frontal  biopsy of tumor 2. Use of neuronavigation, Brainlab  SURGEON: Dr. Elwin Sleight, DO  ASSISTANT: Dr. Ashok Pall, MD  ANESTHESIA: General Endotracheal  EBL: 5cc  SPECIMENS: Right frontal tumor for frozen/permanent pathology, prelim necrotic tissue  DRAINS: None  COMPLICATIONS: None immediate  CONDITION: Hemdynamically stable to PACU  HISTORY: John Watson is a 59 y.o. male that presented last month with seizures and was found to have multifocal slightly enhancing right frontal and a non enhancing right parietal lesion.  Repeat imaging after coming back to the hospital with recurrent seizures a few weeks later showed progression of the right medial frontal lesion with more enhancement and local mass-effect.  Metastatic workup was negative, I recommended a biopsy for pathology and then treatment planning. Risks and benefits were discussed including but not limited to infection, bleeding, non diagnostic tissue, seizures, and the need for more surgery. He verbalized his agreement.  PROCEDURE IN DETAIL: After informed consent was obtained and witnessed, the patient was brought to the operating room. After induction of general anesthesia, the patient was positioned on the operative table in the supine position. All pressure points were meticulously padded. The patient was then placed in the [place] head holder, which was then affixed to the table. Utilizing the preoperative stereotactic MRI scan, surface markers were co-registered until satisfactory accuracy was achieved. The stereotactic scan was also used to identify the lesion, and plan out a trajectory to access this lesion with the biopsy needle through a right frontal approach. Using the stereotactic system, the surface projection of  the biopsy tract was then marked out and prepped and draped in the usual sterile fashion.   After timeout was conducted, skin incision was infiltrated with local anesthetic with epinephrine. Skin incision was then made sharply with a 15 blade, and Bovie electrocautery was used to dissect down to the periosteum which was elevated. The Varioguide was then attached to the [place] head holder, and was positioned with excellent accuracy. Using a high speed drill, a small craniectomy was made in the planned trajectory. The dura was then punctured with a spinal needle.   The depth was measure on the stereotactic MRI and the biopsy needle was measured to that distance of 123mm. A stop was secured at that distance along the biopsy needle. Biopsy needle was then introduced with minimal resistance, and sample was taken at the level of the tumor capsule and a intralesional tissue sample in the 12 oclock fashion. The border appeared white with slight grey discoloration and the intralesional tissue was necrotic with some scant hemorrhagic component. These were sent for frozen pathology. The specimen was deemed to be necrotic. Further samples were taken within the tumor in the 3, 6, 9 oclock regions with similar appearing necrotic hemorrhagic abnormal appearing tissue.  Due to the necrotic analysis, I obtained another sample from within the tumor and another sample at the tumor capsule which again appeared slightly grey discolored. This again was deemed necrotic. I had already taken multiple samples and felt as though a significant amount of necrotic tissue was obtained and had multiple samples of capsular grey discolored tissue remaining for permanent pathology. These were sent down for permanent pathology.  Biopsy needle was then removed. The wound was irrigated with copious amounts of normal saline irrigation. The burr hole was filled with Gelfoam.  The wound is then closed with 3-0 monocryl suture. The patient's head  was removed from the [facility] head holder, her was transferred to a bed, was extubated, and taken to the postanesthesia care unit in stable hemodynamic condition. At the end of the case all sponge, needle, instrument, and cottonoid counts were correct.

## 2020-02-14 ENCOUNTER — Ambulatory Visit: Payer: Self-pay | Admitting: Family Medicine

## 2020-02-14 ENCOUNTER — Encounter (HOSPITAL_COMMUNITY): Payer: Self-pay | Admitting: Neurological Surgery

## 2020-02-14 ENCOUNTER — Inpatient Hospital Stay (HOSPITAL_COMMUNITY): Payer: Self-pay

## 2020-02-14 LAB — CBC
HCT: 42.4 % (ref 39.0–52.0)
Hemoglobin: 13.8 g/dL (ref 13.0–17.0)
MCH: 30.3 pg (ref 26.0–34.0)
MCHC: 32.5 g/dL (ref 30.0–36.0)
MCV: 93.2 fL (ref 80.0–100.0)
Platelets: 299 10*3/uL (ref 150–400)
RBC: 4.55 MIL/uL (ref 4.22–5.81)
RDW: 11.9 % (ref 11.5–15.5)
WBC: 15.2 10*3/uL — ABNORMAL HIGH (ref 4.0–10.5)
nRBC: 0 % (ref 0.0–0.2)

## 2020-02-14 LAB — BASIC METABOLIC PANEL
Anion gap: 14 (ref 5–15)
BUN: 11 mg/dL (ref 6–20)
CO2: 23 mmol/L (ref 22–32)
Calcium: 9.8 mg/dL (ref 8.9–10.3)
Chloride: 103 mmol/L (ref 98–111)
Creatinine, Ser: 1.29 mg/dL — ABNORMAL HIGH (ref 0.61–1.24)
GFR calc Af Amer: >60 mL/min (ref >60)
GFR calc non Af Amer: >60 mL/min (ref >60)
Glucose, Bld: 129 mg/dL — ABNORMAL HIGH (ref 70–99)
Potassium: 3.8 mmol/L (ref 3.5–5.1)
Sodium: 140 mmol/L (ref 135–145)

## 2020-02-14 MED ORDER — TRAMADOL HCL 50 MG PO TABS
50.0000 mg | ORAL_TABLET | Freq: Four times a day (QID) | ORAL | Status: DC | PRN
  Filled 2020-02-14: qty 1

## 2020-02-14 MED ORDER — TRAMADOL HCL 50 MG PO TABS
50.0000 mg | ORAL_TABLET | Freq: Four times a day (QID) | ORAL | 0 refills | Status: AC | PRN
Start: 2020-02-14 — End: 2020-02-21

## 2020-02-14 NOTE — TOC Initial Note (Signed)
Transition of Care [facility]) - Initial/Assessment Note    Patient Details  Name: John Watson MRN: [id number] Date of Birth: 04-Oct-1960  Transition of Care [facility]) CM/SW Contact:    Joanne Chars, LCSW Phone Number: 02/14/2020, 12:11 PM  Clinical Narrative:     CSW spoke with pt regarding recommendation for outpt PT/OT.      Pt reports he attended PT at the [facility] clinic prior to this hospitalization and would like to return there.  Pt also would like 3n1 as recommended by PT.  Pt uninsured and willing to pay out of pocket for both.  Pt asked about FMLA--CSW spoke to people solutions center who provided contact # for [employer], [phone] and said pt needs to call them to initiate FMLA.  Permission given to speak with brother, Al, who will be providing transportation home for pt.      No other needs identified.     Expected Discharge Plan: Home/Self Care Barriers to Discharge: No Barriers Identified   Patient Goals and CMS Choice Patient states their goals for this hospitalization and ongoing recovery are:: get back to how I was before the stroke      Expected Discharge Plan and Services Expected Discharge Plan: Home/Self Care     Post Acute Care Choice: Durable Medical Equipment Living arrangements for the past 2 months: Single Family Home Expected Discharge Date: 02/14/20               DME Arranged: 3-N-1 DME Agency: [employer] Date DME Agency Contacted: 02/14/20 Time DME Agency Contacted: [id number] Representative spoke with at DME Agency: [place]: NA          Prior Living Arrangements/Services Living arrangements for the past 2 months: [place] Lives with:: Siblings Patient language and need for interpreter reviewed:: Yes Do you feel safe going back to the place where you live?: Yes      Need for Family Participation in Patient Care: No (Comment) Care giver support system in place?: Yes (comment) (brother)   Criminal Activity/Legal  Involvement Pertinent to Current Situation/Hospitalization: No - Comment as needed  Activities of Daily Living Home Assistive Devices/Equipment: None ADL Screening (condition at time of admission) Patient's cognitive ability adequate to safely complete daily activities?: Yes Is the patient deaf or have difficulty hearing?: No Does the patient have difficulty seeing, even when wearing glasses/contacts?: No Does the patient have difficulty concentrating, remembering, or making decisions?: No Patient able to express need for assistance with ADLs?: Yes Does the patient have difficulty dressing or bathing?: No Independently performs ADLs?: Yes (appropriate for developmental age) Does the patient have difficulty walking or climbing stairs?: No Weakness of Legs: Left Weakness of Arms/Hands: None  Permission Sought/Granted Permission sought to share information with : Family Supports Permission granted to share information with : Yes, Verbal Permission Granted  Share Information with NAME: Al, brother           Emotional Assessment Appearance:: Appears stated age Attitude/Demeanor/Rapport: Engaged Affect (typically observed): Pleasant Orientation: : Oriented to Self, Oriented to Place, Oriented to  Time, Oriented to Situation Alcohol / Substance Use: Not Applicable Psych Involvement: No (comment)  Admission diagnosis:  Brain lesion [G93.9] Patient Active Problem List   Diagnosis Date Noted  . Brain lesion 02/13/2020  . 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 Seizures ([place]) 01/16/2020  . Hyperlipidemia LDL goal <70 01/16/2020  . Tobacco use disorder 01/16/2020  . 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

## 2020-02-14 NOTE — Progress Notes (Signed)
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 Pt presented last month with seizures and was found to have multifocal right frontal and parietal lesion.  Repeat imaging after coming back to the hospital with recurrent seizures showed progression of the right medial frontal lesion with more enhancement and mass-effect. Pt underwent needle biopsy of R frontal and parietal lesions on 02/13/2020.   Clinical Impression   PTA/hospitalizations, pt independent and worked in [employer] at [employer]. Pt presents with mild L sided weakness, L incoordination and L inattention. Pt lost his balance x 6 posteriorly and toward L when distracted. Pt endorses that he was "running into the L doorjam at home".  Pt is able to verbalize that he is weaker on the L side, not using his L hand "normally" and is aware of falling to the L/posteriorly. Discussed recommendation to follow up with OT at the neuro outpt clinic to facilitate return to independence and return to work. Pt agrees, however overheard pt telling SW that he was discharged from outpt so he no longer needs services, demonstrating decreased insight/awareness of deficits. Recommend 24/7 S and S with all mobility, including tub transfers. Pt would benefit from further cognitive assessment of executive level processing skills in the outpt setting. Discussed with nsg. Will follow acutely to facilitate safe DC home with his brother.     Follow Up Recommendations  Supervision/Assistance - 24 hour;Outpatient OT (neuro outpt)    Equipment Recommendations  3 in 1 bedside commode (for use as shower seat)    Recommendations for Other Services       Precautions / Restrictions Precautions Precautions: Fall Precaution Comments: L inattention Restrictions Weight Bearing  Restrictions: No      Mobility Bed Mobility Overal bed mobility: Needs Assistance       Supine to sit: Min guard     General bed mobility comments: Pt reaching for bed rail to assist; leaning L then able to achieve upright position; pt aware of L bias  Transfers Overall transfer level: Needs assistance   Transfers: Sit to/from Stand;Stand Pivot Transfers Sit to Stand: Supervision Stand pivot transfers: Min guard            Balance Overall balance assessment: Needs assistance   Sitting balance-Leahy Scale: Good       Standing balance-Leahy Scale: Fair Standing balance comment: posteiror/L bias - affected by attention                           ADL either performed or assessed with clinical judgement   ADL Overall ADL's : Needs assistance/impaired Eating/Feeding: Independent   Grooming: Set up;Sitting   Upper Body Bathing: Set up;Sitting   Lower Body Bathing: Min guard;Sit to/from stand   Upper Body Dressing : Set up;Sitting   Lower Body Dressing: Min guard;Sit to/from stand   Toilet Transfer: Min guard;Ambulation   Toileting- Clothing Manipulation and Hygiene: Supervision/safety       Functional mobility during ADLs: Min guard General ADL Comments: Educated pt on fall precautions adn use of 3in1 as shower seat to reduce riask of falls; Educated pt on need to bring feet up to knees/figure four position to reduce fall risk adn manage haed pain     Vision Baseline Vision/History: No visual deficits Additional Comments: Pt pt preferring R side  gaze at times     Perception Perception Perception Tested?: Yes Perception Deficits: Inattention/neglect Inattention/Neglect:  (L environmental inattention) Spatial deficits: Pt notes that he has been "running into the L side of his doorframe" at home; Decreased spatial awareness of location of OT when walking in hallway; when distracted, pt leans L/falls L and posteirorly Comments: will further  assess   Praxis Praxis Praxis-Other Comments: decreased snsory motor skills L UE    Pertinent Vitals/Pain Pain Assessment: 0-10 Pain Score: 1  Pain Location: head Pain Descriptors / Indicators: Aching Pain Intervention(s): Limited activity within patient's tolerance;RN gave pain meds during session     Hand Dominance Right   Extremity/Trunk Assessment Upper Extremity Assessment Upper Extremity Assessment: LUE deficits/detail LUE Deficits / Details: strength @ 4/5 throughtout; weaker promimally; apparent sensory motor dysfunction/coordination deficits; poor disdiadokokenesis LUE Sensation: decreased light touch LUE Coordination: decreased fine motor   Lower Extremity Assessment Lower Extremity Assessment: Defer to PT evaluation   Cervical / Trunk Assessment Cervical / Trunk Assessment: Other exceptions Cervical / Trunk Exceptions: L bias; falls posteirorly and toward L, especially when distractted   Communication Communication Communication: No difficulties   Cognition Arousal/Alertness: Awake/alert Behavior During Therapy: WFL for tasks assessed/performed Overall Cognitive Status: Impaired/Different from baseline Area of Impairment: Attention;Safety/judgement;Awareness                   Current Attention Level: Selective     Safety/Judgement: Decreased awareness of deficits Awareness: Emergent       General Comments       Exercises Other Exercises Other Exercises: began education on BUE integrated coordination tasks   Shoulder Instructions      Home Living Family/patient expects to be discharged to:: Private residence Living Arrangements: Other relatives Available Help at Discharge: Family;Available 24 hours/day Type of Home: House Home Access: Stairs to enter [employer] of Steps: 6 Entrance Stairs-Rails: Right;Left Home Layout: One level     Bathroom Shower/Tub: Corporate investment banker: Standard Bathroom  Accessibility: Yes How Accessible: Accessible via walker Home Equipment: Grab bars - tub/shower          Prior Functioning/Environment Level of Independence: Independent        Comments: works in Water engineer at [employer]        OT Problem List: Decreased strength;Decreased activity tolerance;Impaired balance (sitting and/or standing);Impaired vision/perception;Decreased coordination;Decreased cognition;Decreased safety awareness;Decreased knowledge of use of DME or AE;Impaired UE functional use;Pain      OT Treatment/Interventions: Self-care/ADL training;Neuromuscular education;Therapeutic exercise;DME and/or AE instruction;Therapeutic activities;Cognitive remediation/compensation;Visual/perceptual remediation/compensation;Patient/family education;Balance training    OT Goals(Current goals can be found in the care plan section) Acute Rehab OT Goals Patient Stated Goal: to go home today OT Goal Formulation: With patient Time For Goal Achievement: 02/28/20 Potential to Achieve Goals: Good  OT Frequency: Min 2X/week   Barriers to D/C:            Co-evaluation              AM-PAC OT "6 Clicks" Daily Activity     Outcome Measure Help from another person eating meals?: None Help from another person taking care of personal grooming?: A Little Help from another person toileting, which includes using toliet, bedpan, or urinal?: A Little Help from another person bathing (including washing, rinsing, drying)?: A Little Help from another person to put on and taking off regular upper body clothing?: A Little Help from another person to put on and taking off regular lower body clothing?: A  Little 6 Click Score: 19   End of Session Equipment Utilized During Treatment: Gait belt Nurse Communication: Mobility status  Activity Tolerance: Patient tolerated treatment well Patient left: in bed;with call bell/phone within reach;with bed alarm set  OT Visit  Diagnosis: Unsteadiness on feet (R26.81);Muscle weakness (generalized) (M62.81);Other symptoms and signs involving cognitive function;Pain Pain - part of body:  (head)                Time: 0881-1031 OT Time Calculation (min): 55 min Charges:  OT General Charges $OT Visit: 1 Visit OT Evaluation $OT Eval Moderate Complexity: 1 Mod OT Treatments $Self Care/Home Management : 8-22 mins $Neuromuscular Re-education: 8-22 mins  Maurie Boettcher, OT/L   Acute OT Clinical Specialist Acute Rehabilitation Services Pager [phone] Office [phone]   [facility] 02/14/2020, 11:49 AM

## 2020-02-14 NOTE — Discharge Summary (Signed)
  Physician Discharge Summary  Patient ID: John Watson MRN: [id number] DOB/AGE: 10/25/1960 59 y.o.  Admit date: 02/13/2020 Discharge date: 02/14/2020  Admission Diagnoses:  1.  Right frontal enhancing mass, right parietal nonenhancing mass 2.  Seizures  Discharge Diagnoses:  Same Active Problems:   Brain lesion   Discharged Condition: Stable  Hospital Course:  John Watson is a 59 y.o. male who presented last month with seizures and was found to have multiple right-sided intra-axial lesions.  He underwent the below surgery on 02/13/2020 and tolerated it well.  Postoperatively he was monitored in the neuro ICU.  His pain was controlled on oral medication throughout his stay, he is at his neurologic baseline.  PT OT evaluated him and agreed he can go home.  He was having normal bowel and bladder function upon discharge.  Treatments: Surgery -right frontal stereotactic biopsy of lesion, 02/13/2020  Discharge Exam: Blood pressure 135/84, pulse 90, temperature 98.1 F (36.7 C), temperature source Oral, resp. rate 12, height 5\' 11"  (1.803 m), weight 86.9 kg, SpO2 100 %. Awake, alert, oriented Speech fluent, appropriate CNs grossly intact No drift PERRL EOMI 5/5 BUE/BLE Wound c/d/i  Disposition: Discharge disposition: 01-Home or Self Care       Discharge Instructions    Ambulatory referral to Physical Therapy   Complete by: As directed    Iontophoresis - 4 mg/ml of dexamethasone: No   T.E.N.S. Unit Evaluation and Dispense as Indicated: No     Allergies as of 02/14/2020   No Known Allergies     Medication List    TAKE these medications   atorvastatin 80 MG tablet Commonly known as: LIPITOR Take 1 tablet (80 mg total) by mouth daily.   levETIRAcetam 1000 MG tablet Commonly known as: KEPPRA Take 1 tablet (1,000 mg total) by mouth 2 (two) times daily.   traMADol 50 MG tablet Commonly known as: ULTRAM Take 1 tablet (50 mg total) by mouth every 6 (six) hours  as needed for up to 7 days for moderate pain.       Follow-up Information    Rosealyn Little C, DO Follow up in 2 week(s).   Why: call for appointment Contact information: [address] [place] [address] [phone]        [facility] Schedule an appointment as soon as possible for a visit.   Contact information: [place] [phone]              Signed: [name] Ladanian Kelter 02/14/2020, 10:56 AM

## 2020-02-18 ENCOUNTER — Inpatient Hospital Stay: Payer: Self-pay

## 2020-02-21 ENCOUNTER — Ambulatory Visit (INDEPENDENT_AMBULATORY_CARE_PROVIDER_SITE_OTHER): Payer: Self-pay | Admitting: Emergency Medicine

## 2020-02-21 ENCOUNTER — Ambulatory Visit (HOSPITAL_COMMUNITY): Admission: RE | Admit: 2020-02-21 | Payer: Self-pay | Source: Ambulatory Visit

## 2020-02-21 DIAGNOSIS — I639 Cerebral infarction, unspecified: Secondary | ICD-10-CM

## 2020-02-21 LAB — CUP PACEART REMOTE DEVICE CHECK
Date Time Interrogation Session: 20210922225815
Implantable Pulse Generator Implant Date: 20210819

## 2020-02-22 ENCOUNTER — Ambulatory Visit (HOSPITAL_COMMUNITY): Payer: Self-pay

## 2020-02-25 ENCOUNTER — Telehealth: Payer: Self-pay | Admitting: Internal Medicine

## 2020-02-25 ENCOUNTER — Inpatient Hospital Stay: Payer: Self-pay

## 2020-02-25 NOTE — Telephone Encounter (Signed)
A new pt appt has been scheduled for [name] to see [name] on 10/7 at 11am. I was unable to reach the pt, vm was full. I sent a mychart msg with the appt date and time.

## 2020-02-26 NOTE — Progress Notes (Signed)
Carelink Summary Report / Loop Recorder 

## 2020-02-28 LAB — SURGICAL PATHOLOGY

## 2020-03-03 ENCOUNTER — Inpatient Hospital Stay: Payer: Self-pay | Attending: Neurological Surgery

## 2020-03-03 ENCOUNTER — Telehealth: Payer: Self-pay | Admitting: Radiation Therapy

## 2020-03-03 DIAGNOSIS — R569 Unspecified convulsions: Secondary | ICD-10-CM | POA: Insufficient documentation

## 2020-03-03 DIAGNOSIS — Z79899 Other long term (current) drug therapy: Secondary | ICD-10-CM | POA: Insufficient documentation

## 2020-03-03 DIAGNOSIS — Z8673 Personal history of transient ischemic attack (TIA), and cerebral infarction without residual deficits: Secondary | ICD-10-CM | POA: Insufficient documentation

## 2020-03-03 DIAGNOSIS — C711 Malignant neoplasm of frontal lobe: Secondary | ICD-10-CM | POA: Insufficient documentation

## 2020-03-03 DIAGNOSIS — Z87891 Personal history of nicotine dependence: Secondary | ICD-10-CM | POA: Insufficient documentation

## 2020-03-03 NOTE — Telephone Encounter (Signed)
Left a detailed message about [name]'s upcoming consult with Dr. Lisbeth Renshaw on 10/7 following his visit with [name]. Included in the message was my contact information and a request to call me back with any questions he may have.   Mont Dutton R.T.(R)(T) Radiation Special Procedures Navigator

## 2020-03-05 NOTE — Progress Notes (Signed)
Location/Histology of Brain Tumor: Right frontal enhancing mass, right parietal non-enhancing mass.  Patient presented with left sided weakness and numbness.  He was evaluated in the ER, stroke and seizure work up completed.  He was subsequently discharged with plans to follow-up with neurosurgery as outpatient.  Returned to the hospital with recurrent seizures.  MRI Brain 02/10/2020: Interval enlargement of enhancing cortically based mass at the parasagittal posterior right frontal region, now measuring 3.2 x 3.0 x 2.8 cm.  No significant interval change in size or appearance of the second cortically based mass at the right parietal cortex without discernible enhancement or mass effect.  No other new mass or abnormal enhancement. No other acute intracranial abnormality.  MRI Brain 02/01/2020: Significant enlargement of the right medial frontal cortical lesion.  This lesion appears aggressive with central necrosis and is most consistent with a high-grade glioma. Lymphoma considered given the hyperdensity on CT.  Second lesion right parietal cortex shows no enhancement no interval change. This has the appearance of a low-grade glioma. Again lymphoma considered but not typical in appearance.  CT Head 02/01/2020: Increase in size of mild hyperdense parasagittal right frontal lobe Lesion.  Stable appearance of hypoattenuating right parietal lesion.  Glioma is the most likely differential consideration at this time and the right frontal lesion is unlikely to be low-grade given rapid change.  CT CAP 01/15/2020: No definitive evidence of primary malignancy in the chest, abdomen, or pelvis.  Multiple small subcentimeter low-density lesions throughout the liver that are too small to accurately characterize, largest measures 8 mm. These are nonspecific in the setting of questioned malignancy, but in the absence of additional signs of malignancy are statistically benign cysts. Tiny 3 mm perifissural right middle lobe  pulmonary nodule, nonspecific. This may represent an intrapulmonary lymph node.  MRI Brain 01/14/2020: Area of poorly circumscribed contrast enhancement associated with the right frontal lesion, but not with the right parietal lesion.  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 Follow-up MRI with and without contrast in 4-6 weeks might be helpful for differentiation.   Biopsy: Right frontal Mass 02/13/2020    Past or anticipated interventions, if any, per neurosurgery:  Dr. Reatha Armour  -Right frontal stereotactic biopsy of lesion 02/13/2020   Past or anticipated interventions, if any, per medical oncology:  [name] 03/06/2020   Dose of Decadron, if applicable: 4 mg BID, started today.  Recent neurologic symptoms, if any:   Seizures: no  Headaches: No  Nausea: No  Dizziness/ataxia: No  Difficulty with hand coordination: He states sometimes he picks up something and wants to put it down but is unable to do so.  Focal numbness/weakness: Has some left side deficits.  He is able to ambulate unassisted, states he drags that left foot.  Visual deficits/changes: Has some blurry vision in both eyes.  Confusion/Memory deficits: No  Has some word finding, he states he will think to say one thing but says something totally different.  SAFETY ISSUES:  Prior radiation? no  Pacemaker/ICD? Loop recorder  Possible current pregnancy? n/a  Is the patient on methotrexate? no  Additional Complaints / other details:  6 weeks of radiation, ??[facility] 03/10/2020

## 2020-03-06 ENCOUNTER — Inpatient Hospital Stay (HOSPITAL_BASED_OUTPATIENT_CLINIC_OR_DEPARTMENT_OTHER): Payer: Self-pay | Admitting: Internal Medicine

## 2020-03-06 ENCOUNTER — Ambulatory Visit
Admission: RE | Admit: 2020-03-06 | Discharge: 2020-03-06 | Disposition: A | Discharge status: Home / Self Care | Payer: Self-pay | Source: Ambulatory Visit | Attending: Radiation Oncology | Admitting: Radiation Oncology

## 2020-03-06 ENCOUNTER — Encounter: Payer: Self-pay | Admitting: Radiation Oncology

## 2020-03-06 ENCOUNTER — Encounter: Payer: Self-pay | Admitting: Internal Medicine

## 2020-03-06 ENCOUNTER — Other Ambulatory Visit: Payer: Self-pay

## 2020-03-06 VITALS — BP 107/77 | HR 79 | Temp 97.8°F | Resp 18 | Ht 71.0 in | Wt 181.0 lb

## 2020-03-06 DIAGNOSIS — Z8673 Personal history of transient ischemic attack (TIA), and cerebral infarction without residual deficits: Secondary | ICD-10-CM

## 2020-03-06 DIAGNOSIS — Z79899 Other long term (current) drug therapy: Secondary | ICD-10-CM

## 2020-03-06 DIAGNOSIS — Z87891 Personal history of nicotine dependence: Secondary | ICD-10-CM | POA: Insufficient documentation

## 2020-03-06 DIAGNOSIS — C711 Malignant neoplasm of frontal lobe: Secondary | ICD-10-CM

## 2020-03-06 DIAGNOSIS — R569 Unspecified convulsions: Secondary | ICD-10-CM

## 2020-03-06 DIAGNOSIS — R531 Weakness: Secondary | ICD-10-CM | POA: Insufficient documentation

## 2020-03-06 DIAGNOSIS — Z801 Family history of malignant neoplasm of trachea, bronchus and lung: Secondary | ICD-10-CM | POA: Insufficient documentation

## 2020-03-06 DIAGNOSIS — J341 Cyst and mucocele of nose and nasal sinus: Secondary | ICD-10-CM | POA: Insufficient documentation

## 2020-03-06 MED ORDER — DEXAMETHASONE 4 MG PO TABS: 4.0000 mg | ORAL_TABLET | Freq: Two times a day (BID) | ORAL | 3 refills | Status: DC

## 2020-03-06 NOTE — Progress Notes (Signed)
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   HISTORY OF PRESENT ILLNESS: John Watson is a 59 y.o. male seen at the request of [name] for a newly diagnosed Glioblastoma. The patient presented with weakness and concern for stroke. This was on 01/14/20 when he went to the ED. CT showed possible cortical changes that prompted MRI without contrast that showed a 2.3 cm right parietal lesion and MRI with contrast revealed a 16 x 9 mm area of enhancement in the right frontal lobe and it was recommended he have follow up imaging. He presented with progressive weakness and seizure on 02/01/20 revealed significant enlargement of the right frontal cortical lesion as well as a right parietal lesion. An SRS protocol scan on 02/10/20 revealed the lesion at 3.2 x 3 x 2.8 cm (enlarged from the 02/01/20 scan). There was marked enhancement with contrast and mild vasogenic edema without regional mass effect. The second lesion involving the parietal cortex measured 2 x 2.3 cm and he underwent craniotomy with biopsy on 02/13/20. The specimens were all from the frontal lobe and were consistent with IDH wild type Glioblastoma. He is seen today to discuss treatment recommendations of his cancer. He just finished meeting with [name] prior to this visit.   PREVIOUS RADIATION THERAPY: No   PAST MEDICAL HISTORY:  Past Medical History:  Diagnosis Date  . Glioblastoma ([place]) 02/13/2020  . Pneumonia 2012  . Seizure ([place]) 8/16/211   last one 02/02/2020  . Stroke [facility])    Left leg weakness       PAST SURGICAL HISTORY: Past Surgical History:  Procedure Laterality Date  . APPLICATION OF CRANIAL NAVIGATION Right 02/13/2020    Procedure: APPLICATION OF CRANIAL NAVIGATION;  Surgeon: [name], [name], DO;  Location: [place];  Service: Neurosurgery;  Laterality: Right;  APPLICATION OF CRANIAL NAVIGATION  . BUBBLE STUDY  01/17/2020   Procedure: BUBBLE STUDY;  Surgeon: [name], MD;  Location: [place];  Service: Cardiovascular;;  . FRAMELESS  BIOPSY WITH BRAINLAB Right 02/13/2020   Procedure: STEREOTACTIC BRAIN BIOPSY OF RIGHT SIDED LESION;  Surgeon: [name], DO;  Location: [place];  Service: Neurosurgery;  Laterality: Right;  STEREOTACTIC BRAIN BIOPSY OF RIGHT SIDED LESION  . LOOP RECORDER INSERTION N/A 01/17/2020   Procedure: LOOP RECORDER INSERTION;  Surgeon: [name], MD;  Location: [place] [facility];  Service: Cardiovascular;  Laterality: N/A;  . TEE WITHOUT CARDIOVERSION N/A 01/17/2020   Procedure: TRANSESOPHAGEAL ECHOCARDIOGRAM (TEE);  Surgeon: [name], MD;  Location: [facility] ENDOSCOPY;  Service: Cardiovascular;  Laterality: N/A;     FAMILY HISTORY:  Family History  Problem Relation Age of Onset  . Hypertension Mother   . Lung cancer Father   . Hyperlipidemia Sister   . Hyperlipidemia Brother      SOCIAL HISTORY:  reports that he quit smoking about 19 years ago. His smoking use included cigars. He quit after 2.00 years of use. He has never used smokeless tobacco. He reports current alcohol use of about 1.0 standard drink of alcohol per week. He reports that he does not use drugs. The patient is single and lives in  [place]. He's currently staying with his brother due to his physical weakness. 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  ALLERGIES: Vicodin [hydrocodone-acetaminophen]   MEDICATIONS:  Current Outpatient Medications  Medication Sig Dispense Refill  . atorvastatin (LIPITOR) 80 MG tablet Take 1 tablet (80 mg total) by mouth daily. 30 tablet 2  .  dexamethasone (DECADRON) 4 MG tablet Take 1 tablet (4 mg total) by mouth 2 (two) times daily with a meal. 30 tablet 3  . levETIRAcetam (KEPPRA) 1000 MG tablet Take 1 tablet (1,000 mg total) by mouth 2 (two) times daily. 60 tablet 0   No current facility-administered medications for this encounter.     REVIEW OF SYSTEMS: On review of systems, the patient reports that he is struggling with the weakness in his left side. This is both of upper and lower extremities. He is able to stand but only with help and drags his left foot. He has had blurry vision bilaterally. He has noticed some difficulty with word finding, and was started back on Dexamethasone 4 mg BID due to his symptoms when speaking today with [name]. No other complaints are noted.     PHYSICAL EXAM:  Wt Readings from Last 3 Encounters:  03/06/20 181 lb (82.1 kg)  02/13/20 191 lb 9.3 oz (86.9 kg)  02/11/20 189 lb (85.7 kg)   Temp Readings from Last 3 Encounters:  03/06/20 97.8 F (36.6 C) (Tympanic)  02/14/20 97.7 F (36.5 C) (Oral)  02/11/20 98.3 F (36.8 C) (Oral)   BP Readings from Last 3 Encounters:  03/06/20 107/77  02/14/20 124/78  02/11/20 134/86   Pulse Readings from Last 3 Encounters:  03/06/20 79  02/14/20 85  02/11/20 74   Pain Assessment Pain Score: 0-No pain/10  In general this is a well appearing African American male in no acute distress. He's alert and oriented x4 and appropriate throughout the examination. Cardiopulmonary assessment is negative for acute distress and he exhibits normal effort. He has 2-3/5 strength of his upper and lower extremities. He has to be helped to stand by myself and his brother. No further walking is asked of him out of concern for risks of falls.    ECOG = 2  0 - Asymptomatic (Fully active, able to carry on all predisease activities without restriction)  1 - Symptomatic but completely ambulatory (Restricted in physically strenuous activity but ambulatory and able  to carry out work of a light or sedentary nature. For example, light housework, office work)  2 - Symptomatic, <50% in bed during the day (Ambulatory and capable of all self care but unable to carry out any work activities. Up and about more than 50% of waking hours)  3 - Symptomatic, >50% in bed, but not bedbound (Capable of only limited self-care, confined to bed or chair 50% or more of waking hours)  4 - Bedbound (Completely disabled. Cannot carry on any self-care. Totally confined to bed or chair)  5 - Death   Eustace Pen MM, Creech RH, Tormey DC, et al. [phone]). "Toxicity and response criteria of the [facility]". [place] Oncol. 5 (6): 649-55    LABORATORY DATA:  Lab Results  Component Value Date   WBC 15.2 (H) 02/14/2020   HGB 13.8 02/14/2020   HCT 42.4 02/14/2020   MCV 93.2 02/14/2020   PLT 299 02/14/2020   Lab Results  Component Value Date  NA 140 02/14/2020   K 3.8 02/14/2020   CL 103 02/14/2020   CO2 23 02/14/2020   Lab Results  Component Value Date   ALT 17 02/02/2020   AST 13 (L) 02/02/2020   ALKPHOS 54 02/02/2020   BILITOT 1.1 02/02/2020      RADIOGRAPHY: CT HEAD WO CONTRAST  Result Date: 02/14/2020 CLINICAL DATA:  Follow-up craniotomy EXAM: CT HEAD WITHOUT CONTRAST TECHNIQUE: Contiguous axial images were obtained from the base of the skull through the vertex without intravenous contrast. COMPARISON:  Yesterday FINDINGS: Brain: 2 known cerebral masses on the right at the vertex with interval biopsy of the enhancing and high-density mass along the parasagittal right frontal lobe. Gas and hemorrhage is non progressed. No interval infarct, hydrocephalus, or shift. Vascular: No hyperdense vessel or unexpected calcification. Skull: Unremarkable burr hole. Sinuses/Orbits: Negative IMPRESSION: Stable findings of right frontal mass biopsy. Electronically Signed   By: Monte Fantasia M.D.   On: 02/14/2020 08:21   CT HEAD WO CONTRAST  Result Date:  02/13/2020 CLINICAL DATA:  Brain mass or lesion; post-op biopsy. EXAM: CT HEAD WITHOUT CONTRAST TECHNIQUE: Contiguous axial images were obtained from the base of the skull through the vertex without intravenous contrast. COMPARISON:  Brain MRI 02/10/2020, head CT 02/01/2020. FINDINGS: Brain: There is a biopsy tract containing gas and an expected small amount of acute hemorrhage traversing the right frontal lobe and extending toward the location of a known mass within the paramedian posterior right frontal lobe. The mass itself was better characterized on the contrast-enhanced brain MRI of 02/10/2020. There is mild surrounding parenchymal edema. There is a small amount of right frontal extra-axial pneumocephalus. Redemonstrated cortical/subcortical hypodensity within the right parietal lobe at site of a second cortically based mass (for instance as seen on series 3, image 20). No demarcated cortical infarct. No extra-axial fluid collection. No midline shift. Vascular: No hyperdense vessel. Skull: New small defect within the right frontoparietal calvarium related to interval biopsy. Sinuses/Orbits: Visualized orbits show no acute finding. Polypoid mucosal thickening within the right maxillary sinus. Mild ethmoid sinus mucosal thickening. No significant mastoid effusion at the imaged levels. IMPRESSION: Expected postprocedural changes from interval biopsy of a known mass within the paramedian posterior right frontal lobe. This includes a small amount of gas and acute blood products within the biopsy tract. There is also a small amount of right frontal extra-axial pneumocephalus. Redemonstrated small focus of cortical/subcortical hypodensity within the right parietal lobe at site of a second known cortically based mass. Electronically Signed   By: Kellie Simmering DO   On: 02/13/2020 12:43   MR BRAIN W WO CONTRAST  Result Date: 02/11/2020 CLINICAL DATA:  Initial evaluation for brain lesion, SRS protocol. EXAM: MRI HEAD  WITHOUT AND WITH CONTRAST TECHNIQUE: Multiplanar, multiecho pulse sequences of the brain and surrounding structures were obtained without and with intravenous contrast. CONTRAST:  9m GADAVIST GADOBUTROL 1 MMOL/ML IV SOLN COMPARISON:  Comparison made with recent MRI from 01/31/2020. FINDINGS: Brain: 2 brain lesions again seen. Lesion at the parasagittal posterior right frontal lobe measures 3.2 x 3.0 x 2.8 cm (transverse by AP by craniocaudad), enlarged from prior (previously 2.4 x 1.8 x 2.5 cm). Lesion again seen to markedly enhance following contrast administration. Areas of central necrosis noted on SWI sequence. There is progressive left a meningeal enhancement at the posterior margin of this lesion. Mild surrounding vasogenic edema without significant regional mass effect. Second lesion involving the right parietal cortex measures 2.0 x 2.3 cm, not significantly changed  from prior. Again, this lesion demonstrates cortical thickening with associated increased T2/FLAIR signal intensity. No associated enhancement following contrast administration. No significant surrounding edema or mass effect. No other new lesions or abnormal enhancement. Underlying mild age-related cerebral atrophy. Scattered mild nonspecific cerebral white matter changes elsewhere is relatively unchanged. No acute infarct. A small chronic left cerebellar infarct again noted. Vascular: Major intracranial vascular flow voids are maintained. Skull and upper cervical spine: Craniocervical junction normal. Bone marrow signal intensity normal. No scalp soft tissue abnormality. Sinuses/Orbits: Globes and orbital soft tissues within normal limits. Mild scattered mucosal thickening noted within the ethmoidal air cells. Right maxillary sinus retention cyst noted. Small right mastoid effusion noted, of doubtful significance. Other: None. IMPRESSION: 1. Interval enlargement of enhancing cortically based mass at the parasagittal posterior right frontal  region, now measuring 3.2 x 3.0 x 2.8 cm. 2. No significant interval change in size or appearance of the second cortically based mass at the right parietal cortex without discernible enhancement or mass effect. 3. No other new mass or abnormal enhancement. No other acute intracranial abnormality. Electronically Signed   By: Jeannine Boga M.D.   On: 02/11/2020 04:39   CUP PACEART REMOTE DEVICE CHECK  Result Date: 02/21/2020 ILR summary report received. Battery status OK. Normal device function. No new symptom episodes, tachy episodes, brady, or pause episodes. No new AF episodes. Monthly summary reports and ROV/PRN Kathy Breach, RN, CCDS, [employer]      IMPRESSION/PLAN: 1. IDH Wild Type multifocal Gliobastoma. Dr. Lisbeth Renshaw discusses the pathology findings and reviews the nature of primary brain disease. Dr. Lisbeth Renshaw discusses the rationale to proceed with  chemoRT. Dr. Lisbeth Renshaw discusses the delivery and logistics of radiotherapy and anticipates a course of 6 weeks of radiotherapy. Written consent is obtained and placed in the chart, a copy was provided to the patient.  He will return for simulation with IV contrast next week on Wednesday. The plan would be to start therapy on 03/17/20.  In a visit lasting 60 minutes, greater than 50% of the time was spent face to face discussing the patient's condition, in preparation for the discussion, and coordinating the patient's care.   The above documentation reflects my direct findings during this shared patient visit. Please see the separate note by Dr. Lisbeth Renshaw on this date for the remainder of the patient's plan of care.    Carola Rhine, PAC

## 2020-03-06 NOTE — Progress Notes (Incomplete)
Has armband been applied?    Does patient have an allergy to IV contrast dye?: No   Has patient ever received premedication for IV contrast dye?: n/a  Does patient take metformin?: no  If patient does take metformin when was the last dose: n/a  Date of lab work: 02/14/2020 BUN: 11 CR: 1.29 eGFR: >60  IV site:   Has IV site been added to flowsheet?

## 2020-03-06 NOTE — Progress Notes (Signed)
[facility] [place], [place] [address] [phone]   New Patient Evaluation  Date of Service: 03/06/20 Patient Name: BEJAMIN HACKBART Patient MRN: [id number] Patient DOB: [birth year] Provider: Ventura Sellers, MD  Identifying Statement:  ARDEAN MELROY is a 59 y.o. male with right frontal glioblastoma who presents for initial consultation and evaluation.    Referring Provider: No referring provider defined for this encounter.  Oncologic History: 02/13/20: Sterotactic biopsy with Dr. Reatha Armour; path demonstrates glioblastoma  Biomarkers:  MGMT Unknown.  IDH 1/2 Wild type.  EGFR Unknown  TERT Unknown   History of Present Illness: The patient's records from the referring physician were obtained and reviewed and the patient interviewed to confirm this HPI.  Jonetta Speak presented to medical attention in mid-September after experiencing first ever seizures.  Events were described as mainly left sided shaking with post-seizure weakness.  CNS imaging demonstrated enhancing mass in the right frontal lobe consistent with primary brain tumor.  He underwent biopsy with Dr. Reatha Armour on 02/13/20; path was sent out and ultimately confirmed glioblastoma.  Today he complains of increasing weakness of the left arm and leg.  He is walking a little on his own, but for the most part is requiring support to ambulate from family or walker.  His left hand he is not using as much, but he is particularly weak at the left shoulder.  This has worsened somewhat since surgery.  Otherwise no recurrence of seizure episodes, maintains compliance with Keppra.  Not dosing decadron currently.  Medications: Current Outpatient Medications on File Prior to Visit  Medication Sig Dispense Refill  . atorvastatin (LIPITOR) 80 MG tablet Take 1 tablet (80 mg total) by mouth daily. 30 tablet 2  . levETIRAcetam (KEPPRA) 1000 MG tablet Take 1 tablet (1,000 mg total) by  mouth 2 (two) times daily. 60 tablet 0   No current facility-administered medications on file prior to visit.    Allergies:  Allergies  Allergen Reactions  . Vicodin [Hydrocodone-Acetaminophen] Anaphylaxis   Past Medical History:  Past Medical History:  Diagnosis Date  . Glioblastoma ([facility]) 02/13/2020  . Pneumonia 2012  . Seizure ([place]) 8/16/211   last one 02/02/2020  . Stroke [facility])    Left leg weakness   Past Surgical History:  Past Surgical History:  Procedure Laterality Date  . APPLICATION OF CRANIAL NAVIGATION Right 02/13/2020   Procedure: APPLICATION OF CRANIAL NAVIGATION;  Surgeon: [name], [name], DO;  Location: [facility];  Service: Neurosurgery;  Laterality: Right;  APPLICATION OF CRANIAL NAVIGATION  . BUBBLE STUDY  01/17/2020   Procedure: BUBBLE STUDY;  Surgeon: [name], MD;  Location: [facility];  Service: Cardiovascular;;  . FRAMELESS  BIOPSY WITH BRAINLAB Right 02/13/2020   Procedure: STEREOTACTIC BRAIN BIOPSY OF RIGHT SIDED LESION;  Surgeon: [name], DO;  Location: [facility];  Service: Neurosurgery;  Laterality: Right;  STEREOTACTIC BRAIN BIOPSY OF RIGHT SIDED LESION  . LOOP RECORDER INSERTION N/A 01/17/2020   Procedure: LOOP RECORDER INSERTION;  Surgeon: [name], MD;  Location: [facility] CV LAB;  Service: Cardiovascular;  Laterality: N/A;  . TEE WITHOUT CARDIOVERSION N/A 01/17/2020   Procedure: TRANSESOPHAGEAL ECHOCARDIOGRAM (TEE);  Surgeon: [name], MD;  Location: [facility] ENDOSCOPY;  Service: Cardiovascular;  Laterality: N/A;   Social History:  Social History   Socioeconomic History  . Marital status: Single    Spouse name: Not on file  . Number of children: 1  .  Years of education: Not on file  . Highest education level: Not on file  Occupational History  . Not on file  Tobacco Use  . Smoking status: Former Smoker    Years: 2.00    Types: Cigars    Quit date: 2002    Years since quitting: 19.7  . Smokeless tobacco:  Never Used  Vaping Use  . Vaping Use: Never used  Substance and Sexual Activity  . Alcohol use: Yes    Alcohol/week: 1.0 standard drink    Types: 1 Cans of beer per week    Comment: occasionally  . Drug use: No  . Sexual activity: Yes    Birth control/protection: None  Other Topics Concern  . Not on file  Social History Narrative  . Not on file   Social Determinants of Health   Financial Resource Strain:   . Difficulty of Paying Living Expenses: Not on file  Food Insecurity:   . Worried About Charity fundraiser in the Last Year: Not on file  . Ran Out of Food in the Last Year: Not on file  Transportation Needs:   . Lack of Transportation (Medical): Not on file  . Lack of Transportation (Non-Medical): Not on file  Physical Activity:   . Days of Exercise per Week: Not on file  . Minutes of Exercise per Session: Not on file  Stress:   . Feeling of Stress : Not on file  Social Connections:   . Frequency of Communication with Friends and Family: Not on file  . Frequency of Social Gatherings with Friends and Family: Not on file  . Attends Religious Services: Not on file  . Active Member of Clubs or Organizations: Not on file  . Attends Archivist Meetings: Not on file  . Marital Status: Not on file  Intimate Partner Violence:   . Fear of Current or Ex-Partner: Not on file  . Emotionally Abused: Not on file  . Physically Abused: Not on file  . Sexually Abused: Not on file   Family History:  Family History  Problem Relation Age of Onset  . Hypertension Mother   . Lung cancer Father   . Hyperlipidemia Sister   . Hyperlipidemia Brother     Review of Systems: Constitutional: Doesn't report fevers, chills or abnormal weight loss Eyes: Doesn't report blurriness of vision Ears, nose, mouth, throat, and face: Doesn't report sore throat Respiratory: Doesn't report cough, dyspnea or wheezes Cardiovascular: Doesn't report palpitation, chest discomfort   Gastrointestinal:  Doesn't report nausea, constipation, diarrhea GU: Doesn't report incontinence Skin: Doesn't report skin rashes Neurological: Per HPI Musculoskeletal: Doesn't report joint pain Behavioral/Psych: Doesn't report anxiety  Physical Exam: Vitals:   03/06/20 1132  BP: 107/77  Pulse: 79  Resp: 18  Temp: 97.8 F (36.6 C)  SpO2: 100%   KPS: 70. General: Alert, cooperative, pleasant, in no acute distress Head: Normal EENT: No conjunctival injection or scleral icterus.  Lungs: Resp effort normal Cardiac: Regular rate Abdomen: Non-distended abdomen Skin: No rashes cyanosis or petechiae. Extremities: No clubbing or edema  Neurologic Exam: Mental Status: Awake, alert, attentive to examiner. Oriented to self and environment. Language is fluent with intact comprehension.  Cranial Nerves: Visual acuity is grossly normal. Visual fields are full. Extra-ocular movements intact. No ptosis. Face is symmetric Motor: Tone and bulk are normal. Power is 4/5 in left arm and leg, proximal>distal weakness in arm. Reflexes are symmetric, no pathologic reflexes present.  Sensory: Intact to light touch Gait: Hemiparetic  Labs: I have reviewed the data as listed    Component Value Date/Time   NA 140 02/14/2020 1122   K 3.8 02/14/2020 1122   CL 103 02/14/2020 1122   CO2 23 02/14/2020 1122   GLUCOSE 129 (H) 02/14/2020 1122   BUN 11 02/14/2020 1122   CREATININE 1.29 (H) 02/14/2020 1122   CALCIUM 9.8 02/14/2020 1122   PROT 6.5 02/02/2020 0316   ALBUMIN 3.7 02/02/2020 0316   AST 13 (L) 02/02/2020 0316   ALT 17 02/02/2020 0316   ALKPHOS 54 02/02/2020 0316   BILITOT 1.1 02/02/2020 0316   GFRNONAA >60 02/14/2020 1122   GFRAA >60 02/14/2020 1122   Lab Results  Component Value Date   WBC 15.2 (H) 02/14/2020   NEUTROABS 3.3 01/14/2020   HGB 13.8 02/14/2020   HCT 42.4 02/14/2020   MCV 93.2 02/14/2020   PLT 299 02/14/2020    Imaging:  CT HEAD WO CONTRAST  Result Date:  02/14/2020 CLINICAL DATA:  Follow-up craniotomy EXAM: CT HEAD WITHOUT CONTRAST TECHNIQUE: Contiguous axial images were obtained from the base of the skull through the vertex without intravenous contrast. COMPARISON:  Yesterday FINDINGS: Brain: 2 known cerebral masses on the right at the vertex with interval biopsy of the enhancing and high-density mass along the parasagittal right frontal lobe. Gas and hemorrhage is non progressed. No interval infarct, hydrocephalus, or shift. Vascular: No hyperdense vessel or unexpected calcification. Skull: Unremarkable burr hole. Sinuses/Orbits: Negative IMPRESSION: Stable findings of right frontal mass biopsy. Electronically Signed   By: Monte Fantasia M.D.   On: 02/14/2020 08:21   CT HEAD WO CONTRAST  Result Date: 02/13/2020 CLINICAL DATA:  Brain mass or lesion; post-op biopsy. EXAM: CT HEAD WITHOUT CONTRAST TECHNIQUE: Contiguous axial images were obtained from the base of the skull through the vertex without intravenous contrast. COMPARISON:  Brain MRI 02/10/2020, head CT 02/01/2020. FINDINGS: Brain: There is a biopsy tract containing gas and an expected small amount of acute hemorrhage traversing the right frontal lobe and extending toward the location of a known mass within the paramedian posterior right frontal lobe. The mass itself was better characterized on the contrast-enhanced brain MRI of 02/10/2020. There is mild surrounding parenchymal edema. There is a small amount of right frontal extra-axial pneumocephalus. Redemonstrated cortical/subcortical hypodensity within the right parietal lobe at site of a second cortically based mass (for instance as seen on series 3, image 20). No demarcated cortical infarct. No extra-axial fluid collection. No midline shift. Vascular: No hyperdense vessel. Skull: New small defect within the right frontoparietal calvarium related to interval biopsy. Sinuses/Orbits: Visualized orbits show no acute finding. Polypoid mucosal  thickening within the right maxillary sinus. Mild ethmoid sinus mucosal thickening. No significant mastoid effusion at the imaged levels. IMPRESSION: Expected postprocedural changes from interval biopsy of a known mass within the paramedian posterior right frontal lobe. This includes a small amount of gas and acute blood products within the biopsy tract. There is also a small amount of right frontal extra-axial pneumocephalus. Redemonstrated small focus of cortical/subcortical hypodensity within the right parietal lobe at site of a second known cortically based mass. Electronically Signed   By: Kellie Simmering DO   On: 02/13/2020 12:43   MR BRAIN W WO CONTRAST  Result Date: 02/11/2020 CLINICAL DATA:  Initial evaluation for brain lesion, SRS protocol. EXAM: MRI HEAD WITHOUT AND WITH CONTRAST TECHNIQUE: Multiplanar, multiecho pulse sequences of the brain and surrounding structures were obtained without and with intravenous contrast. CONTRAST:  19m GADAVIST GADOBUTROL 1  MMOL/ML IV SOLN COMPARISON:  Comparison made with recent MRI from 01/31/2020. FINDINGS: Brain: 2 brain lesions again seen. Lesion at the parasagittal posterior right frontal lobe measures 3.2 x 3.0 x 2.8 cm (transverse by AP by craniocaudad), enlarged from prior (previously 2.4 x 1.8 x 2.5 cm). Lesion again seen to markedly enhance following contrast administration. Areas of central necrosis noted on SWI sequence. There is progressive left a meningeal enhancement at the posterior margin of this lesion. Mild surrounding vasogenic edema without significant regional mass effect. Second lesion involving the right parietal cortex measures 2.0 x 2.3 cm, not significantly changed from prior. Again, this lesion demonstrates cortical thickening with associated increased T2/FLAIR signal intensity. No associated enhancement following contrast administration. No significant surrounding edema or mass effect. No other new lesions or abnormal enhancement. Underlying  mild age-related cerebral atrophy. Scattered mild nonspecific cerebral white matter changes elsewhere is relatively unchanged. No acute infarct. A small chronic left cerebellar infarct again noted. Vascular: Major intracranial vascular flow voids are maintained. Skull and upper cervical spine: Craniocervical junction normal. Bone marrow signal intensity normal. No scalp soft tissue abnormality. Sinuses/Orbits: Globes and orbital soft tissues within normal limits. Mild scattered mucosal thickening noted within the ethmoidal air cells. Right maxillary sinus retention cyst noted. Small right mastoid effusion noted, of doubtful significance. Other: None. IMPRESSION: 1. Interval enlargement of enhancing cortically based mass at the parasagittal posterior right frontal region, now measuring 3.2 x 3.0 x 2.8 cm. 2. No significant interval change in size or appearance of the second cortically based mass at the right parietal cortex without discernible enhancement or mass effect. 3. No other new mass or abnormal enhancement. No other acute intracranial abnormality. Electronically Signed   By: Jeannine Boga M.D.   On: 02/11/2020 04:39   CUP PACEART REMOTE DEVICE CHECK  Result Date: 02/21/2020 ILR summary report received. Battery status OK. Normal device function. No new symptom episodes, tachy episodes, brady, or pause episodes. No new AF episodes. Monthly summary reports and ROV/PRN Kathy Breach, RN, CCDS, [employer]   Pathology: SURGICAL PATHOLOGY  CASE: [phone]  PATIENT: Taye Kaser  Surgical Pathology Report      Clinical History: Brain lesion (nt)      FINAL MICROSCOPIC DIAGNOSIS:   A. BRAIN, RIGHT FRONTAL, BIOPSY:  - Glioblastoma, IDH-wild-type, WHO grade 4. See comment   B. BRAIN, RIGHT FRONTAL, BIOPSY:  - Glioblastoma, IDH-wild-type, WHO grade 4. See comment   C. BRAIN, RIGHT FRONTAL, BIOPSY:  - Glioblastoma, IDH-wild-type, WHO grade 4. See comment      COMMENT:   This case was sent in consultation to Dr. Maisie Fus at [facility]  pathology department. Immunostains performed at the outside institution  show that IDH1 R132H is negative. ATRX expression is retained and p53  is negative. A copy of the outside pathology report is available in  patient's electronic medical records.    Assessment/Plan Glioblastoma of frontal lobe ([place]) [C71.1]  We appreciate the opportunity to participate in the care of TORION HULGAN.  He presents with clinical and radiographic syndrome consistent with right frontal glioblastoma, IDH-wt.  We had an extensive conversation with him regarding pathology, prognosis, and available treatment pathways.  He understands the tumor location abutting the motor cortex, and his motor deficits will be factors limiting his prognosis.  He is otherwise quite healthy for his age.  We ultimately recommended proceeding with course of intensity modulated radiation therapy and concurrent daily Temozolomide.  Radiation will be administered Mon-Fri over 6  weeks, Temodar will be dosed at 77m/m2 to be given daily over 42 days.  We reviewed side effects of temodar, including fatigue, nausea/vomiting, constipation, and cytopenias.  Chemotherapy should be held for the following:  ANC less than 1,000  Platelets less than 100,000  LFT or creatinine greater than 2x ULN  If clinical concerns/contraindications develop  Every 2 weeks during radiation, labs will be checked accompanied by a clinical evaluation in the brain tumor clinic.  We also recommended trial of dexamethasone 451mdaily to address recent worsening motor function.  We can continue to titrate decadron from the clinic, though if it is not helpful after 5-7 days he may stop it on his own.  Screening for potential clinical trials was performed and discussed using eligibility criteria for active protocols [facility], as well as  national database available on [email or web address]   The patient is not a candidate for a research protocol at this time due to poor functional status.   We spent twenty additional minutes teaching regarding the natural history, biology, and historical experience in the treatment of brain tumors. We then discussed in detail the current recommendations for therapy focusing on the mode of administration, mechanism of action, anticipated toxicities, and quality of life issues associated with this plan. We also provided teaching sheets for the patient to take home as an additional resource.  All questions were answered. The patient knows to call the clinic with any problems, questions or concerns. No barriers to learning were detected.  The total time spent in the encounter was 60 minutes and more than 50% was on counseling and review of test results   ZaVentura SellersMD Medical Director of Neuro-Oncology [facility]0/07/21 4:13 PM

## 2020-03-07 ENCOUNTER — Other Ambulatory Visit: Payer: Self-pay | Admitting: Internal Medicine

## 2020-03-07 ENCOUNTER — Telehealth: Payer: Self-pay | Admitting: Pharmacist

## 2020-03-07 ENCOUNTER — Encounter: Payer: Self-pay | Admitting: Hematology and Oncology

## 2020-03-07 MED ORDER — ONDANSETRON HCL 8 MG PO TABS: 8.0000 mg | ORAL_TABLET | Freq: Two times a day (BID) | ORAL | 1 refills | Status: DC | PRN

## 2020-03-07 MED ORDER — TEMOZOLOMIDE 140 MG PO CAPS: 140.0000 mg | ORAL_CAPSULE | Freq: Every day | ORAL | 0 refills | Status: DC

## 2020-03-07 NOTE — Progress Notes (Signed)
Patient called regarding applying for [employer] for assistance with Temodar.  Patient will be approved for the grant. He will sign grant paperwork on Tues 03/11/20. He has my contact information to call.

## 2020-03-07 NOTE — Progress Notes (Signed)
START ON PATHWAY REGIMEN - Neuro     One cycle, concurrent with RT:     Temozolomide   **Always confirm dose/schedule in your pharmacy ordering system**  Patient Characteristics: Glioblastoma (Grade IV Glioma), Newly Diagnosed / Treatment Naive, Good Performance Status and/or Younger Patient, MGMT Promoter Unmethylated/Unknown Disease Classification: Glioma Disease Classification: Glioblastoma (Grade IV Glioma) Disease Status: Newly Diagnosed / Treatment Naive Performance Status: Good Performance Status and/or Younger Patient MGMT Promoter Methylation Status: Awaiting Test Results Intent of Therapy: Non-Curative / Palliative Intent, Discussed with Patient 

## 2020-03-07 NOTE — Telephone Encounter (Signed)
Oral Oncology Pharmacist Encounter  Received new prescription for Temodar (temozolomide) for the treatment of glioblastoma in conjunction with radiation, planned duration 42 days.  Prescription dose and frequency assessed for appropriateness. Appropriate for therapy initiation.   BMP and CBC from 02/14/20 as well as LFTs from [facility] on 02/02/20 assessed, noted Scr 1.29 mg/dL (CrCl ~71.6 mL/min) - no baseline dose adjustments recommended.  Current medication list in Epic reviewed, no relevant/significant DDIs with Temodar identified.  Evaluated chart and no patient barriers to medication adherence noted.   Prescription has been e-scribed to the [facility] for benefits analysis and approval.  Oral Oncology Clinic will continue to follow for insurance authorization, copayment issues, initial counseling and start date.  Leron Croak, PharmD, BCPS Hematology/Oncology Clinical Pharmacist [facility] [phone] 03/07/2020 10:25 AM

## 2020-03-10 ENCOUNTER — Ambulatory Visit (HOSPITAL_COMMUNITY): Payer: Self-pay | Attending: Neurological Surgery | Admitting: Physical Therapy

## 2020-03-10 ENCOUNTER — Encounter (HOSPITAL_COMMUNITY): Payer: Self-pay | Admitting: Physical Therapy

## 2020-03-10 ENCOUNTER — Encounter: Payer: Self-pay | Admitting: *Deleted

## 2020-03-10 ENCOUNTER — Other Ambulatory Visit: Payer: Self-pay

## 2020-03-10 DIAGNOSIS — R278 Other lack of coordination: Secondary | ICD-10-CM | POA: Insufficient documentation

## 2020-03-10 DIAGNOSIS — R29818 Other symptoms and signs involving the nervous system: Secondary | ICD-10-CM | POA: Insufficient documentation

## 2020-03-10 DIAGNOSIS — R2689 Other abnormalities of gait and mobility: Secondary | ICD-10-CM | POA: Insufficient documentation

## 2020-03-10 NOTE — Therapy (Signed)
[place], [place], [address] Phone: [phone]   Fax:  [phone]  Physical Therapy Evaluation  Patient Details  Name: John Watson MRN: [id number] Date of Birth: 10-15-1960 Referring Provider (PT): Pieter Partridge [name] [place]   Encounter Date: 03/10/2020   PT End of Session - 03/10/20 1626    Visit Number 1    Number of Visits 12    Date for PT Re-Evaluation 04/21/20    Authorization Type Self Pay    PT Start Time 7026    PT Stop Time 1445    PT Time Calculation (min) 40 min    Activity Tolerance Patient tolerated treatment well    Behavior During Therapy [facility] for tasks assessed/performed           Past Medical History:  Diagnosis Date  . Glioblastoma ([place]) 02/13/2020  . Pneumonia 2012  . Seizure ([place]) 8/16/211   last one 02/02/2020  . Stroke [facility])    Left leg weakness    Past Surgical History:  Procedure Laterality Date  . APPLICATION OF CRANIAL NAVIGATION Right 02/13/2020   Procedure: APPLICATION OF CRANIAL NAVIGATION;  Surgeon: [name], [name], DO;  Location: [place];  Service: Neurosurgery;  Laterality: Right;  APPLICATION OF CRANIAL NAVIGATION  . BUBBLE STUDY  01/17/2020   Procedure: BUBBLE STUDY;  Surgeon: [name], MD;  Location: [place];  Service: Cardiovascular;;  . FRAMELESS  BIOPSY WITH BRAINLAB Right 02/13/2020   Procedure: STEREOTACTIC BRAIN BIOPSY OF RIGHT SIDED LESION;  Surgeon: [name], DO;  Location: [place];  Service: Neurosurgery;  Laterality: Right;  STEREOTACTIC BRAIN BIOPSY OF RIGHT SIDED LESION  . LOOP RECORDER INSERTION N/A 01/17/2020   Procedure: LOOP RECORDER INSERTION;  Surgeon: [name], MD;  Location: [place] CV LAB;  Service: Cardiovascular;  Laterality: N/A;  . TEE WITHOUT CARDIOVERSION N/A 01/17/2020   Procedure: TRANSESOPHAGEAL ECHOCARDIOGRAM (TEE);  Surgeon: [name], MD;  Location: [facility] ENDOSCOPY;  Service: Cardiovascular;   Laterality: N/A;    There were no vitals filed for this visit.    Subjective Assessment - 03/10/20 1404    Subjective Patient is 59 y.o. male who presents to physical therapy with a brain lesion with c/o L sided weakness. Patient states that he had a biopsy in September 15 and since he has been down hill. He has strength deficits on L side. Sensation is normal. He has had 5 falls in the last month because he forgets about leg. He is having a lot of issues with left hand also. He fell off the toilet today when he was going to sit down He feels his leg does not support him like it used to. His main goal for physical therapy is to get back to where he was before. He has been improving with steroids that were given to him.    Limitations Walking;House hold activities;Standing    Patient Stated Goals to get back to where he was before.    Currently in Pain? No/denies              Procedure [facility] PT Assessment - 03/10/20 0001      Assessment   Medical Diagnosis Brain Lesion/ balance/ strength    Referring Provider (PT) Pieter Partridge [name] DP    Onset Date/Surgical Date 02/13/20    Next MD Visit 2 weeks    Prior Therapy no      Precautions   Precautions None      Restrictions  Weight Bearing Restrictions No      Balance Screen   Has the patient fallen in the past 6 months No    Has the patient had a decrease in activity level because of a fear of falling?  No    Is the patient reluctant to leave their home because of a fear of falling?  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[employer], walking      Cognition   Overall Cognitive Status Within Functional Limits for tasks assessed      Observation/Other Assessments   Observations Ambulates without AD, impaired foot clearance, unsteady, relies on R side    Focus on Therapeutic Outcomes (FOTO)  not completed      Sensation   Light  Touch Appears Intact      Strength   Right Hip Flexion 5/5    Left Hip Flexion 2/5    Right Knee Flexion 5/5    Right Knee Extension 5/5    Left Knee Flexion 2-/5    Left Knee Extension 3-/5    Right Ankle Dorsiflexion 5/5    Left Ankle Dorsiflexion 3-/5      Transfers   Five time sit to stand comments  16 seconds    Comments without UE use, relies on RLE and momentum      Ambulation/Gait   Ambulation/Gait Yes    Ambulation/Gait Assistance 4: Min guard    Ambulation Distance (Feet) 325 Feet    Assistive device None    Gait Pattern Decreased arm swing - right;Decreased arm swing - left;Decreased hip/knee flexion - left;Left circumduction;Left foot flat;Left flexed knee in stance;Poor foot clearance - left    Ambulation Surface Level;Indoor    Gait Comments 2MWT, unsteady without AD, vears left      Dynamic Gait Index   Level Surface Moderate Impairment    Change in Gait Speed Moderate Impairment    Gait with Horizontal Head Turns Moderate Impairment    Gait with Vertical Head Turns Moderate Impairment    Gait and Pivot Turn Moderate Impairment    Step Over Obstacle Severe Impairment    Step Around Obstacles Moderate Impairment    Steps Moderate Impairment    Total Score 7    DGI comment: high risk for falling                      Objective measurements completed on examination: See above findings.               PT Education - 03/10/20 1413    Education Details Patient educated on exam findings, POC, scope of PT, remaining active    Person(s) Educated Patient    Methods Explanation;Demonstration    Comprehension Returned demonstration;Verbalized understanding            PT Short Term Goals - 03/10/20 1632      PT SHORT TERM GOAL #1   Title Patient will be independent with HEP in order to improve functional outcomes.    Time 3    Period Weeks    Status New    Target Date 03/31/20      PT SHORT TERM GOAL #2   Title Patient will report  at least 25% improvement in symptoms for improved quality of life.    Time 3    Period Weeks  Status New    Target Date 03/31/20             PT Long Term Goals - 03/10/20 1633      PT LONG TERM GOAL #1   Title Patient will report at least 75% improvement in symptoms for improved quality of life.    Time 6    Period Weeks    Status New    Target Date 04/21/20      PT LONG TERM GOAL #2   Title Patient will be able to complete 5x STS in under 11.4 seconds in order to reduce the risk of falls.    Time 6    Period Weeks    Status New    Target Date 04/21/20      PT LONG TERM GOAL #3   Title Patient will be able to navigate stairs with reciprocal pattern without compensation in order to demonstrate improved LE strength.    Time 6    Period Weeks    Status New    Target Date 04/21/20      PT LONG TERM GOAL #4   Title Patient will be able to ambulate at least 425 feet in 2MWT in order to demonstrate improved gait speed for community ambulation.    Time 6    Period Weeks    Status New    Target Date 04/21/20      PT LONG TERM GOAL #5   Title Patient will score at least 15 points on DGI to indicate improved dynamic balance.    Time 6    Period Weeks    Status New    Target Date 04/21/20                  Plan - 03/10/20 1629    Clinical Impression Statement Patient is 59 y.o. male who presents to physical therapy with a brain lesion with c/o L sided weakness. He presents with deficits in LLE strength, gait, endurance, balance, and functional mobility with ADL. He is having to modify and restrict ADL as indicated by DGI score as well as subjective information and objective measures which is affecting overall participation. Patient is at a very high risk for falling. Patient will benefit from skilled physical therapy in order to improve function and reduce impairment.    Personal Factors and Comorbidities Comorbidity 2    Comorbidities Stroke, brain lesion     Examination-Activity Limitations Transfers;Locomotion Level;Bed Mobility;Bend;Carry;Caring for Others;Dressing;Squat;Stairs;Stand    Examination-Participation Restrictions Occupation;Yard Work;Volunteer;Shop;Community Activity;Cleaning;Meal Prep    Stability/Clinical Decision Making Evolving/Moderate complexity    Clinical Decision Making Moderate    Rehab Potential Fair    PT Frequency 2x / week    PT Duration 6 weeks    PT Treatment/Interventions Gait training;Stair training;Functional mobility training;Therapeutic activities;Therapeutic exercise;Balance training;DME Instruction;ADLs/Self Care Home Management;Neuromuscular re-education;Patient/family education;Aquatic Therapy;Traction;Manual techniques;Passive range of motion;Joint Manipulations;Dry needling;Spinal Manipulations;Energy conservation;Splinting    PT Next Visit Plan begin LE strengthening with table/standing exercise, begin balance training, gait training with SPC or RW    PT Home Exercise Plan initiate next session    Consulted and Agree with Plan of Care Patient           Patient will benefit from skilled therapeutic intervention in order to improve the following deficits and impairments:  Decreased balance, Decreased mobility, Impaired sensation, Impaired vision/preception, Abnormal gait, Difficulty walking, Decreased coordination, Cardiopulmonary status limiting activity, Decreased endurance, Decreased activity tolerance, Decreased knowledge of use of DME, Improper body mechanics  Visit Diagnosis: Other abnormalities of gait and mobility  Other symptoms and signs involving the nervous system     Problem List Patient Active Problem List   Diagnosis Date Noted  . Brain lesion 02/13/2020  . Glioblastoma of frontal lobe ([place]) 02/01/2020  . Seizures ([place]) 01/16/2020  . Hyperlipidemia LDL goal <70 01/16/2020  . Tobacco use disorder 01/16/2020  . 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

## 2020-03-10 NOTE — Progress Notes (Signed)
[place] Work  Clinical Social Work was referred by radiation oncology RN, patient expressing questions regarding disability.  CSW contacted patient by phone and discussed employer short-term disability and [place].  Patient plans to discuss with his employer regarding benefits for next year and follow up with CSW afterwards.  Gwinda Maine, LCSW  Clinical Social Worker [facility]

## 2020-03-11 ENCOUNTER — Telehealth: Payer: Self-pay | Admitting: Internal Medicine

## 2020-03-11 ENCOUNTER — Other Ambulatory Visit: Payer: Self-pay

## 2020-03-11 ENCOUNTER — Ambulatory Visit
Admission: RE | Admit: 2020-03-11 | Discharge: 2020-03-11 | Disposition: A | Discharge status: Home / Self Care | Payer: Self-pay | Source: Ambulatory Visit | Attending: Radiation Oncology | Admitting: Radiation Oncology

## 2020-03-11 VITALS — BP 134/78 | HR 86 | Temp 97.5°F | Resp 18 | Ht 71.0 in | Wt 180.2 lb

## 2020-03-11 DIAGNOSIS — C711 Malignant neoplasm of frontal lobe: Secondary | ICD-10-CM | POA: Insufficient documentation

## 2020-03-11 DIAGNOSIS — Z510 Encounter for antineoplastic radiation therapy: Secondary | ICD-10-CM | POA: Insufficient documentation

## 2020-03-11 MED ORDER — SODIUM CHLORIDE 0.9% FLUSH: 10.0000 mL | Freq: Once | INTRAVENOUS | Status: DC

## 2020-03-11 NOTE — Progress Notes (Addendum)
Has armband been applied?  yes  Does patient have an allergy to IV contrast dye?: No   Has patient ever received premedication for IV contrast dye?: n/a  Does patient take metformin?: no  If patient does take metformin when was the last dose: n/a  Date of lab work: 02/14/2020 BUN: 11 CR: 1.29 eGFR: >60  IV site: left  AC  Has IV site been added to flowsheet?   Yes Vitals:   03/11/20 1346  BP: 134/78  Pulse: 86  Resp: 18  Temp: (!) 97.5 F (36.4 C)  TempSrc: Temporal  SpO2: 100%  Weight: 81.8 kg  Height: '5\' 11"'  (1.803 m)

## 2020-03-11 NOTE — Telephone Encounter (Signed)
Scheduled appointments per 10/12 scheduling message. Spoke to patient who is aware of appointments dates and times.

## 2020-03-12 ENCOUNTER — Ambulatory Visit (HOSPITAL_COMMUNITY): Payer: Self-pay | Admitting: Occupational Therapy

## 2020-03-12 ENCOUNTER — Encounter (HOSPITAL_COMMUNITY): Payer: Self-pay | Admitting: Occupational Therapy

## 2020-03-12 DIAGNOSIS — R278 Other lack of coordination: Secondary | ICD-10-CM

## 2020-03-12 DIAGNOSIS — R29818 Other symptoms and signs involving the nervous system: Secondary | ICD-10-CM

## 2020-03-12 MED FILL — TEMOZOLOMIDE 140 MG CAPS: 140 | 42 days supply | Qty: 42 | Fill #0

## 2020-03-12 NOTE — Telephone Encounter (Signed)
Oral Chemotherapy Pharmacist Encounter  I spoke with patient for overview of: Temodar for the treatment of glioblastoma multiforme in conjunction with radiation, planned duration concomitant phase 42 days of therapy.  Patient will likely continue on Temodar for maintenance treatment for 6-12 cycles after completion of concomitant phase.  Counseled patient on administration, dosing, side effects, monitoring, drug-food interactions, safe handling, storage, and disposal.  Patient will take Temodar 140mg  capsules, one capsule (140 mg total daily dose), by mouth once daily, may take at bedtime and on an empty stomach to decrease nausea and vomiting.  Patient will take Temodar concurrent with radiation for 42 days straight.  Temodar state date: 03/16/2020 PM Radiation start date: 03/17/2020   Patient will take Zofran 8mg  tablet, 1 tablet by mouth 30-60 min prior to Temodar dose to help decrease N/V once starting adjuvant therapy. Prophylactic Zofran will not be used at initiation of concurrent phase, but will be initiated if nausea develops despite Temodar administration on an empty stomach and at bedtime.   Adverse effects include but are not limited to: nausea, vomiting, anorexia, GI upset, rash, drug fever, and fatigue. Rare but serious adverse effects of pneumocystis pneumonia and secondary malignancy also discussed.  PCP prophylaxis will not be initiated at this time, but may be added based on lymphocyte count in the future.  Reviewed with patient importance of keeping a medication schedule and plan for any missed doses. No barriers to medication adherence or psychosocial barriers to adherence identified.  Medication reconciliation performed and medication/allergy list updated.  Patient obtained [place] that will help cover cost of Temodar so that patient's out of pocket copay is $0. Patient will pick up Temodar from the [facility] on 03/13/20. Mr. Nick knows not  to start medication until 03/16/2020 PM.   Patient informed the pharmacy will reach out 5-7 days prior to needing next fill of Temodar to coordinate continued medication acquisition to prevent break in therapy.   All questions answered.  Mr. Probert voiced understanding and appreciation.   Medication education handout and medication calendar placed in mail for patient. Patient knows to call the office with questions or concerns. Oral Chemotherapy Clinic phone number provided to patient.   Leron Croak, PharmD, BCPS Hematology/Oncology Clinical Pharmacist [facility] [phone] 03/12/2020 1:55 PM

## 2020-03-12 NOTE — Patient Instructions (Signed)
Complete each exercise 10-15X, 2-3X/day  1) Towel crunch Place a small towel on a firm table top. Flatten out the towel and then place your hand on one end of it.  Next, flex your fingers 2-5 (index finger through pinky finger) as you pull the towel towards your hand.    2) Digit composite flexion/adduction (make a fist) Hold your hand up as shown. Open and close your hand into a fist and repeat. If you cannot make a full fist, then make a partial fist.    3) Thumb/finger opposition Touch the tip of the thumb to each fingertip one by one. Extend fingers fully after they are touched.      4) Finger Taps Start with the hand flat and fingers slightly spread.  One at a time, starting with the thumb, lift each finger up separately.      5) Digit Abduction/Adduction Hold hand palm down flat on table. Spread your fingers apart and back together.     Shoulder Exercises: Repeat all exercises 10-15 times, 1-2 times per day.  1) Shoulder Protraction    Begin with elbows by your side, slowly "punch" straight out in front of you.      2) Shoulder Flexion  Standing:         Begin with arms at your side with thumbs pointed up, slowly raise both arms up and forward towards overhead.               3) Horizontal abduction/adduction  Standing:           Begin with arms straight out in front of you, bring out to the side in at "T" shape. Keep arms straight entire time.                 4) Internal & External Rotation  Standing:    Stand with elbows at the side and elbows bent 90 degrees. Move your forearms away from your body, then bring back inward toward the body.     5) Shoulder Abduction  Standing:       Lying on your back begin with your arms flat on the table next to your side. Slowly move your arms out to the side so that they go overhead, in a jumping jack or snow angel movement.

## 2020-03-13 NOTE — Therapy (Signed)
[place], [place], [address] Phone: [phone]   Fax:  [phone]  Occupational Therapy Evaluation  Patient Details  Name: John Watson MRN: [id number] Date of Birth: May 04, 1961 Referring Provider (OT): Dr. Pieter Partridge [name]   Encounter Date: 03/12/2020   OT End of Session - 03/13/20 0853    Visit Number 1    Number of Visits 12   mini-reassessment 04/09/2020   Date for OT Re-Evaluation 04/25/20    Authorization Type Self-pay    OT Start Time 1643    OT Stop Time 1720    OT Time Calculation (min) 37 min    Activity Tolerance Patient tolerated treatment well    Behavior During Therapy [facility] for tasks assessed/performed           Past Medical History:  Diagnosis Date  . Glioblastoma ([place]) 02/13/2020  . Pneumonia 2012  . Seizure ([place]) 8/16/211   last one 02/02/2020  . Stroke [facility])    Left leg weakness    Past Surgical History:  Procedure Laterality Date  . APPLICATION OF CRANIAL NAVIGATION Right 02/13/2020   Procedure: APPLICATION OF CRANIAL NAVIGATION;  Surgeon: [name], [name], DO;  Location: [place];  Service: Neurosurgery;  Laterality: Right;  APPLICATION OF CRANIAL NAVIGATION  . BUBBLE STUDY  01/17/2020   Procedure: BUBBLE STUDY;  Surgeon: [name], MD;  Location: [place];  Service: Cardiovascular;;  . FRAMELESS  BIOPSY WITH BRAINLAB Right 02/13/2020   Procedure: STEREOTACTIC BRAIN BIOPSY OF RIGHT SIDED LESION;  Surgeon: [name], DO;  Location: [place];  Service: Neurosurgery;  Laterality: Right;  STEREOTACTIC BRAIN BIOPSY OF RIGHT SIDED LESION  . LOOP RECORDER INSERTION N/A 01/17/2020   Procedure: LOOP RECORDER INSERTION;  Surgeon: [name], MD;  Location: [place] CV LAB;  Service: Cardiovascular;  Laterality: N/A;  . TEE WITHOUT CARDIOVERSION N/A 01/17/2020   Procedure: TRANSESOPHAGEAL ECHOCARDIOGRAM (TEE);  Surgeon: [name], MD;  [facility]: [facility] ENDOSCOPY;   Service: Cardiovascular;  Laterality: N/A;    There were no vitals filed for this visit.   Subjective Assessment - 03/12/20 1715    Subjective  S: This left arm just doesn't work quite like the right one.    Pertinent History Pt is a 59 y/o male wit recent hx of recurrent seizures and was found to have multifocal slightly enhancing right frontal and a non enhancing right parietal lesion. Pt with biopsy on 02/13/20. Pt presents with left side weakness and has been referred to occupational therapy for evaluation and treatment by Dr. Pieter Partridge [name].    Special Tests DASH: 31.82    Patient Stated Goals To improve the use of my left arm.    Currently in Pain? No/denies             [facility] OT Assessment - 03/12/20 1642      Assessment   Medical Diagnosis Brain Lesion with left side weakness    Referring Provider (OT) Dr. Pieter Partridge [name]    Onset Date/Surgical Date 02/13/20    Hand Dominance Right    Next MD Visit 04/01/2020    Prior Therapy None      Precautions   Precautions None      Restrictions   Weight Bearing Restrictions No      Balance Screen   Has the patient fallen in the past 6 months Yes    How many times? 5    Has the patient had a decrease in  activity level because of a fear of falling?  No    Is the patient reluctant to leave their home because of a fear of falling?  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 Pt reports he has to make a conscious effort to let go of things, his hand wants to clamp on and not let go. Pt reports he needs increased time to complete tasks.        Written Expression   Dominant Hand Right      Cognition   Overall Cognitive Status Within Functional Limits for  tasks assessed      Sensation   Light Touch Appears Intact      Coordination   9 Hole Peg Test Right;Left    Right 9 Hole Peg Test 22.39"    Left 9 Hole Peg Test 53.03"      Tone   Assessment Location Left Upper Extremity      ROM / Strength   AROM / PROM / Strength AROM;Strength      AROM   Overall AROM  Within functional limits for tasks performed      Strength   Strength Assessment Site Shoulder;Elbow;Forearm;Wrist;Hand    Right/Left Shoulder Left    Left Shoulder Flexion 3/5    Left Shoulder ABduction 3/5    Left Shoulder Internal Rotation 3/5    Left Shoulder External Rotation 3-/5    Right/Left Elbow Left    Left Elbow Flexion 5/5    Left Elbow Extension 4/5    Right/Left Forearm Left    Left Forearm Pronation 4+/5    Left Forearm Supination 5/5    Right/Left Wrist Left    Left Wrist Flexion 5/5    Left Wrist Extension 5/5    Left Wrist Radial Deviation 5/5    Left Wrist Ulnar Deviation 5/5    Right/Left hand Left;Right    Right Hand Gross Grasp Functional    Right Hand Grip (lbs) 140    Right Hand Lateral Pinch 26 lbs    Right Hand 3 Point Pinch 27 lbs    Left Hand Gross Grasp Functional    Left Hand Grip (lbs) 75    Left Hand Lateral Pinch 20 lbs    Left Hand 3 Point Pinch 19 lbs      LUE Tone   LUE Tone Mild               Katina Dung - 03/12/20 1712    Open a tight or new jar Moderate difficulty    Do heavy household chores (wash walls, wash floors) Severe difficulty    Carry a shopping bag or briefcase No difficulty    Wash your back Moderate difficulty    Use a knife to cut food No difficulty    Recreational activities in which you take some force or impact through your arm, shoulder, or hand (golf, hammering, tennis) Unable    During the past week, to what extent has your arm, shoulder or hand problem interfered with your normal social activities with family, friends, neighbors, or groups? Not at all    During the past week, to what extent  has your arm,  shoulder or hand problem limited your work or other regular daily activities Quite a bit    Arm, shoulder, or hand pain. None    Tingling (pins and needles) in your arm, shoulder, or hand None    Difficulty Sleeping No difficulty    DASH Score 31.82 %                      OT Education - 03/13/20 0852    Education Details A/ROM, finger ROM/coordination tasks    Person(s) Educated Patient    Methods Explanation;Demonstration;Handout    Comprehension Verbalized understanding;Returned demonstration            OT Short Term Goals - 03/13/20 0952      OT SHORT TERM GOAL #1   Title Pt will be provided with and educated on HEP to improve LUE functional use during ADL completion.    Time 3    Period Weeks    Status New    Target Date 04/02/20      OT SHORT TERM GOAL #2   Title Pt will increase LUE shoulder strength to 4/5 to improve ability to complete housework tasks using LUE as an assist.    Time 3    Period Weeks    Status New      OT SHORT TERM GOAL #3   Title Pt will increase left grip strength by 10# and pinch strength by 2# to improve ability to grasp and maintain hold on items while carrying across a room.    Time 3    Period Weeks    Status New      OT SHORT TERM GOAL #4   Title Pt will improve left fine motor coordination by completing 9 hole peg test in 45 seconds to improve ability to use left hand as assist during bilateral coordination tasks.    Time 3    Period Weeks    Status New             OT Long Term Goals - 03/13/20 0956      OT LONG TERM GOAL #1   Title Pt will increase LUE strength to 4+/5 or greater to improve ability to use LUE to perform lifting tasks to shoulder height or greater.    Time 6    Period Weeks    Status New    Target Date 04/25/20      OT LONG TERM GOAL #2   Title Pt will increase left grip strength by 25# and pinch strength by 5# to improve ability to hold and lift heavy items lift mattress or wet  laundry during ADLs.    Time 6    Period Weeks    Status New      OT LONG TERM GOAL #3   Title Pt will increase left fine motor coordination required for tasks such as uncapping toothpaste and putting toothpaste on toothbrush by completing 9 hole peg test in under 40 seconds.    Time 6    Period Weeks    Status New      OT LONG TERM GOAL #4   Title Pt will demonstrate improvement in gross muscle tone and coordination by completing bilateral tasks with minimal delay in LUE function.    Time 6    Period Weeks    Status New                 Plan - 03/13/20 0945    Clinical Impression  Statement A: Pt is a 59 y/o male presenting with left side weakness and coordination deficits secondary to brain lesion. Pt with decreased use of LUE during daily tasks, has mild increase in tone with purposeful movements/use of the LUE. Pt begins radiation next week and is motivated to improve his functioning.    OT Occupational Profile and History Problem Focused Assessment - Including review of records relating to presenting problem    Occupational performance deficits (Please refer to evaluation for details): ADL's;IADL's;Work;Leisure    Body Structure / Function / Physical Skills ADL;Endurance;UE functional use;Fascial restriction;IADL;Strength;Tone;Mobility;Sensation;Coordination    Rehab Potential Good    Clinical Decision Making Limited treatment options, no task modification necessary    Comorbidities Affecting Occupational Performance: None    Modification or Assistance to Complete Evaluation  No modification of tasks or assist necessary to complete eval    OT Frequency 2x / week    OT Duration 6 weeks    OT Treatment/Interventions Self-care/ADL training;Energy conservation;DME and/or AE instruction;Patient/family education;Passive range of motion;Electrical Stimulation;Functional Mobility Training;Splinting;Moist Heat;Therapeutic exercise;Manual Therapy;Therapeutic activities;Neuromuscular  education    Plan P: Pt will benefit from skilled OT services to increase strength, coordination, and functional use of LUE during daily tasks. Treatment plan: AA/ROM, A/ROM, general LUE strengthening and coordination training, fine motor coordination tasks, grip and pinch strengthening, ADL training    OT Home Exercise Plan eval: LUE shoulder A/ROM, finger ROM/coordination    Consulted and Agree with Plan of Care Patient           Patient will benefit from skilled therapeutic intervention in order to improve the following deficits and impairments:   Body Structure / Function / Physical Skills: ADL, Endurance, UE functional use, Fascial restriction, IADL, Strength, Tone, Mobility, Sensation, Coordination       Visit Diagnosis: Other symptoms and signs involving the nervous system  Other lack of coordination    Problem List Patient Active Problem List   Diagnosis Date Noted  . Brain lesion 02/13/2020  . Glioblastoma of frontal lobe ([place]) 02/01/2020  . Seizures ([place]) 01/16/2020  . Hyperlipidemia LDL goal <70 01/16/2020  . Tobacco use disorder 01/16/2020  . 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 [place], [place], [address] Phone: [phone]   Fax:  [phone]  Name: John Watson MRN: [id number] Date of Birth: 07/04/1960

## 2020-03-14 ENCOUNTER — Encounter: Payer: Self-pay | Admitting: Internal Medicine

## 2020-03-17 ENCOUNTER — Ambulatory Visit
Admission: RE | Admit: 2020-03-17 | Discharge: 2020-03-17 | Disposition: A | Discharge status: Home / Self Care | Payer: Self-pay | Source: Ambulatory Visit | Attending: Radiation Oncology | Admitting: Radiation Oncology

## 2020-03-17 ENCOUNTER — Other Ambulatory Visit: Payer: Self-pay | Admitting: Internal Medicine

## 2020-03-17 MED ORDER — BACLOFEN 5 MG PO TABS: 5.0000 mg | ORAL_TABLET | Freq: Three times a day (TID) | ORAL | 1 refills | Status: DC | PRN

## 2020-03-18 ENCOUNTER — Encounter (HOSPITAL_COMMUNITY): Payer: Self-pay

## 2020-03-18 ENCOUNTER — Ambulatory Visit (HOSPITAL_COMMUNITY): Payer: Self-pay

## 2020-03-18 ENCOUNTER — Ambulatory Visit
Admission: RE | Admit: 2020-03-18 | Discharge: 2020-03-18 | Disposition: A | Discharge status: Home / Self Care | Payer: Self-pay | Source: Ambulatory Visit | Attending: Radiation Oncology | Admitting: Radiation Oncology

## 2020-03-18 ENCOUNTER — Other Ambulatory Visit: Payer: Self-pay

## 2020-03-18 DIAGNOSIS — R29818 Other symptoms and signs involving the nervous system: Secondary | ICD-10-CM

## 2020-03-18 DIAGNOSIS — R2689 Other abnormalities of gait and mobility: Secondary | ICD-10-CM

## 2020-03-18 NOTE — Therapy (Signed)
[place], [place], [address] Phone: [phone]   Fax:  [phone]  Physical Therapy Treatment  Patient Details  Name: John Watson MRN: [id number] Date of Birth: Jul 07, 1960 Referring Provider (PT): Pieter Partridge [name] [place]   Encounter Date: 03/18/2020   PT End of Session - 03/18/20 1750    Visit Number 2    Number of Visits 12    Date for PT Re-Evaluation 04/21/20    Authorization Type Self Pay    PT Start Time 2122    PT Stop Time 1827    PT Time Calculation (min) 39 min    Activity Tolerance Patient tolerated treatment well    Behavior During Therapy [facility] for tasks assessed/performed           Past Medical History:  Diagnosis Date  . Glioblastoma ([place]) 02/13/2020  . Pneumonia 2012  . Seizure ([place]) 8/16/211   last one 02/02/2020  . Stroke [facility])    Left leg weakness    Past Surgical History:  Procedure Laterality Date  . APPLICATION OF CRANIAL NAVIGATION Right 02/13/2020   Procedure: APPLICATION OF CRANIAL NAVIGATION;  Surgeon: [name], [name], DO;  Location: [place];  Service: Neurosurgery;  Laterality: Right;  APPLICATION OF CRANIAL NAVIGATION  . BUBBLE STUDY  01/17/2020   Procedure: BUBBLE STUDY;  Surgeon: [name], MD;  Location: [place];  Service: Cardiovascular;;  . FRAMELESS  BIOPSY WITH BRAINLAB Right 02/13/2020   Procedure: STEREOTACTIC BRAIN BIOPSY OF RIGHT SIDED LESION;  Surgeon: [name], DO;  Location:  [facility];  Service: Neurosurgery;  Laterality: Right;  STEREOTACTIC BRAIN BIOPSY OF RIGHT SIDED LESION  . LOOP RECORDER INSERTION N/A 01/17/2020   Procedure: LOOP RECORDER INSERTION;  Surgeon: [name], MD;  Location: [place] CV LAB;  Service: Cardiovascular;  Laterality: N/A;  . TEE WITHOUT CARDIOVERSION N/A 01/17/2020   Procedure: TRANSESOPHAGEAL ECHOCARDIOGRAM (TEE);  Surgeon: [name], MD;  Location: [facility] ENDOSCOPY;  Service: Cardiovascular;   Laterality: N/A;    There were no vitals filed for this visit.   Subjective Assessment - 03/18/20 1746    Subjective Pt arrived with no AD.  No reports of pain or recent falls.  Reports his ability his ability to move leg dependent upon how tired he is.  Reports he has been using elliptical and weights every morning.    Patient Stated Goals to get back to where he was before.    Currently in Pain? No/denies                             [facility] Adult PT Treatment/Exercise - 03/18/20 0001      Exercises   Exercises Knee/Hip      Knee/Hip Exercises: Seated   Heel Slides 5 reps    Other Seated Knee/Hip Exercises glut sets 10x 5"      Knee/Hip Exercises: Supine   Quad Sets 2 sets;5 reps    Bridges 10 reps    Bridges Limitations noted compensation with Rt LE    Other Supine Knee/Hip Exercises isometric abd with belt around                  PT Education - 03/18/20 1757    Education Details Reviewed goals, educated importance of HEP compliance for maximal benefit that was established this session.    Person(s) Educated Patient    Methods Explanation;Demonstration;Handout    Comprehension  Verbalized understanding;Returned demonstration            PT Short Term Goals - 03/10/20 1632      PT SHORT TERM GOAL #1   Title Patient will be independent with HEP in order to improve functional outcomes.    Time 3    Period Weeks    Status New    Target Date 03/31/20      PT SHORT TERM GOAL #2   Title Patient will report at least 25% improvement in symptoms for improved quality of life.    Time 3    Period Weeks    Status New    Target Date 03/31/20             PT Long Term Goals - 03/10/20 1633      PT LONG TERM GOAL #1   Title Patient will report at least 75% improvement in symptoms for improved quality of life.    Time 6    Period Weeks    Status New    Target Date 04/21/20      PT LONG TERM GOAL #2   Title Patient will be able to complete 5x  STS in under 11.4 seconds in order to reduce the risk of falls.    Time 6    Period Weeks    Status New    Target Date 04/21/20      PT LONG TERM GOAL #3   Title Patient will be able to navigate stairs with reciprocal pattern without compensation in order to demonstrate improved LE strength.    Time 6    Period Weeks    Status New    Target Date 04/21/20      PT LONG TERM GOAL #4   Title Patient will be able to ambulate at least 425 feet in 2MWT in order to demonstrate improved gait speed for community ambulation.    Time 6    Period Weeks    Status New    Target Date 04/21/20      PT LONG TERM GOAL #5   Title Patient will score at least 15 points on DGI to indicate improved dynamic balance.    Time 6    Period Weeks    Status New    Target Date 04/21/20                 Plan - 03/18/20 1831    Clinical Impression Statement Reviewed goals, educated importance of HEP compliance for maximal beneftis that was established this session.  Pt arrived ambulating with no AD, pt encouraged to purchase walker to assist with gait and safety to reduce risk of falls.  Therex focus on Lt LE strengthening.  Noted compensation with Rt LE movements to assist.  Pt educated benefits on specific isometric exercises to address hip and LE weakness, tactile and verbal cueing.    Personal Factors and Comorbidities Comorbidity 2    Comorbidities Stroke, brain lesion    Examination-Activity Limitations Transfers;Locomotion Level;Bed Mobility;Bend;Carry;Caring for Others;Dressing;Squat;Stairs;Stand    Examination-Participation Restrictions Occupation;Yard Work;Volunteer;Shop;Community Activity;Cleaning;Meal Prep    Stability/Clinical Decision Making Evolving/Moderate complexity    Clinical Decision Making Moderate    Rehab Potential Fair    PT Frequency 2x / week    PT Duration 6 weeks    PT Treatment/Interventions Gait training;Stair training;Functional mobility training;Therapeutic  activities;Therapeutic exercise;Balance training;DME Instruction;ADLs/Self Care Home Management;Neuromuscular re-education;Patient/family education;Aquatic Therapy;Traction;Manual techniques;Passive range of motion;Joint Manipulations;Dry needling;Spinal Manipulations;Energy conservation;Splinting    PT Next Visit Plan begin  LE strengthening with table/standing exercise, begin balance training, gait training with SPC or RW    PT Home Exercise Plan 10/19: glut sets, quad sets, heel slide supine and seated.           Patient will benefit from skilled therapeutic intervention in order to improve the following deficits and impairments:  Decreased balance, Decreased mobility, Impaired sensation, Impaired vision/preception, Abnormal gait, Difficulty walking, Decreased coordination, Cardiopulmonary status limiting activity, Decreased endurance, Decreased activity tolerance, Decreased knowledge of use of DME, Improper body mechanics  Visit Diagnosis: Other symptoms and signs involving the nervous system  Other abnormalities of gait and mobility     Problem List Patient Active Problem List   Diagnosis Date Noted  . Brain lesion 02/13/2020  . Glioblastoma of frontal lobe ([place]) 02/01/2020  . Seizures ([place]) 01/16/2020  . Hyperlipidemia LDL goal <70 01/16/2020  . Tobacco use disorder 01/16/2020  . 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 [place], [place], [address] Phone: [phone]   Fax:  [phone]  Name: OMAREE FUQUA MRN: [id number] Date of Birth: 03/03/1961

## 2020-03-18 NOTE — Patient Instructions (Addendum)
Heel Slides    Squeeze pelvic floor and hold. Slide left heel along bed towards bottom.  Hold for 5 seconds. Slide back to flat knee position. Repeat 10 times. Do 2 times a day.  Quad Sets    Squeeze muscle anterior thigh pushing back of knee down. Hold for 5 seconds. .  Repeat 5 times. Do 2 times a day.   Copyright  VHI. All rights reserved.   Glut sets: Squeeze butt cheeks together, hold for 5" then relax.  Seated heel slide:  Sitting on edge of chair slide out foot on ground then pull bending knee back.

## 2020-03-19 ENCOUNTER — Ambulatory Visit
Admission: RE | Admit: 2020-03-19 | Discharge: 2020-03-19 | Disposition: A | Discharge status: Home / Self Care | Payer: Self-pay | Source: Ambulatory Visit | Attending: Radiation Oncology | Admitting: Radiation Oncology

## 2020-03-19 ENCOUNTER — Other Ambulatory Visit: Payer: Self-pay

## 2020-03-19 ENCOUNTER — Ambulatory Visit (HOSPITAL_COMMUNITY): Payer: Self-pay | Admitting: Physical Therapy

## 2020-03-20 ENCOUNTER — Ambulatory Visit
Admission: RE | Admit: 2020-03-20 | Discharge: 2020-03-20 | Disposition: A | Discharge status: Home / Self Care | Payer: Self-pay | Source: Ambulatory Visit | Attending: Radiation Oncology | Admitting: Radiation Oncology

## 2020-03-20 NOTE — Progress Notes (Signed)
Pt here for patient teaching.  Pt given Radiation and You booklet and Sonafine.  Reviewed areas of pertinence such as fatigue, hair loss, nausea and vomiting, skin changes, headache and blurry vision . Pt able to give teach back of to pat skin and use unscented/gentle soap,apply Sonafine bid and avoid applying anything to skin within 4 hours of treatment. Pt verbalizes understanding of information given and will contact nursing with any questions or concerns.     Gloriajean Dell. Leonie Green, BSN

## 2020-03-21 ENCOUNTER — Telehealth (HOSPITAL_COMMUNITY): Payer: Self-pay

## 2020-03-21 ENCOUNTER — Ambulatory Visit (HOSPITAL_COMMUNITY): Payer: Self-pay

## 2020-03-21 ENCOUNTER — Ambulatory Visit
Admission: RE | Admit: 2020-03-21 | Discharge: 2020-03-21 | Disposition: A | Discharge status: Home / Self Care | Payer: Self-pay | Source: Ambulatory Visit | Attending: Radiation Oncology | Admitting: Radiation Oncology

## 2020-03-21 DIAGNOSIS — C711 Malignant neoplasm of frontal lobe: Secondary | ICD-10-CM

## 2020-03-21 MED ORDER — SONAFINE EX EMUL
1.0000 "application " | Freq: Once | CUTANEOUS | Status: AC
  Administered 2020-03-21: 1 via TOPICAL

## 2020-03-21 NOTE — Telephone Encounter (Signed)
He can not come in today - he will be here on Monday

## 2020-03-23 ENCOUNTER — Encounter: Payer: Self-pay | Admitting: Internal Medicine

## 2020-03-24 ENCOUNTER — Ambulatory Visit (HOSPITAL_COMMUNITY): Payer: Self-pay | Admitting: Physical Therapy

## 2020-03-24 ENCOUNTER — Telehealth (HOSPITAL_COMMUNITY): Payer: Self-pay | Admitting: Physical Therapy

## 2020-03-24 ENCOUNTER — Ambulatory Visit
Admission: RE | Admit: 2020-03-24 | Discharge: 2020-03-24 | Disposition: A | Discharge status: Home / Self Care | Payer: Self-pay | Source: Ambulatory Visit | Attending: Radiation Oncology | Admitting: Radiation Oncology

## 2020-03-24 ENCOUNTER — Other Ambulatory Visit: Payer: Self-pay

## 2020-03-24 NOTE — Telephone Encounter (Signed)
pt lmonvm to cancel this appt for today and tomorrow.

## 2020-03-25 ENCOUNTER — Ambulatory Visit (INDEPENDENT_AMBULATORY_CARE_PROVIDER_SITE_OTHER): Payer: Self-pay

## 2020-03-25 ENCOUNTER — Ambulatory Visit
Admission: RE | Admit: 2020-03-25 | Discharge: 2020-03-25 | Disposition: A | Discharge status: Home / Self Care | Payer: Self-pay | Source: Ambulatory Visit | Attending: Radiation Oncology | Admitting: Radiation Oncology

## 2020-03-25 ENCOUNTER — Ambulatory Visit (HOSPITAL_COMMUNITY): Payer: Self-pay | Admitting: Physical Therapy

## 2020-03-25 DIAGNOSIS — I639 Cerebral infarction, unspecified: Secondary | ICD-10-CM

## 2020-03-25 LAB — CUP PACEART REMOTE DEVICE CHECK
Date Time Interrogation Session: 20211025225815
Implantable Pulse Generator Implant Date: 20210819

## 2020-03-26 ENCOUNTER — Ambulatory Visit
Admission: RE | Admit: 2020-03-26 | Discharge: 2020-03-26 | Disposition: A | Discharge status: Home / Self Care | Payer: Self-pay | Source: Ambulatory Visit | Attending: Radiation Oncology | Admitting: Radiation Oncology

## 2020-03-27 ENCOUNTER — Other Ambulatory Visit: Payer: Self-pay

## 2020-03-27 ENCOUNTER — Ambulatory Visit
Admission: RE | Admit: 2020-03-27 | Discharge: 2020-03-27 | Disposition: A | Discharge status: Home / Self Care | Payer: Self-pay | Source: Ambulatory Visit | Attending: Radiation Oncology | Admitting: Radiation Oncology

## 2020-03-27 ENCOUNTER — Inpatient Hospital Stay (HOSPITAL_BASED_OUTPATIENT_CLINIC_OR_DEPARTMENT_OTHER): Payer: Self-pay | Admitting: Internal Medicine

## 2020-03-27 ENCOUNTER — Inpatient Hospital Stay: Payer: Self-pay

## 2020-03-27 ENCOUNTER — Encounter: Payer: Self-pay | Admitting: Internal Medicine

## 2020-03-27 VITALS — BP 133/73 | HR 89 | Temp 97.5°F | Resp 18 | Ht 71.0 in | Wt 187.5 lb

## 2020-03-27 DIAGNOSIS — C711 Malignant neoplasm of frontal lobe: Secondary | ICD-10-CM

## 2020-03-27 DIAGNOSIS — R569 Unspecified convulsions: Secondary | ICD-10-CM

## 2020-03-27 LAB — CBC WITH DIFFERENTIAL (CANCER CENTER ONLY)
Abs Immature Granulocytes: 0.9 10*3/uL — ABNORMAL HIGH (ref 0.00–0.07)
Basophils Absolute: 0 10*3/uL (ref 0.0–0.1)
Basophils Relative: 0 %
Eosinophils Absolute: 0 10*3/uL (ref 0.0–0.5)
Eosinophils Relative: 0 %
HCT: 39.3 % (ref 39.0–52.0)
Hemoglobin: 13 g/dL (ref 13.0–17.0)
Immature Granulocytes: 5 %
Lymphocytes Relative: 3 %
Lymphs Abs: 0.6 10*3/uL — ABNORMAL LOW (ref 0.7–4.0)
MCH: 30.6 pg (ref 26.0–34.0)
MCHC: 33.1 g/dL (ref 30.0–36.0)
MCV: 92.5 fL (ref 80.0–100.0)
Monocytes Absolute: 1.3 10*3/uL — ABNORMAL HIGH (ref 0.1–1.0)
Monocytes Relative: 7 %
Neutro Abs: 15.1 10*3/uL — ABNORMAL HIGH (ref 1.7–7.7)
Neutrophils Relative %: 85 %
Platelet Count: 199 10*3/uL (ref 150–400)
RBC: 4.25 MIL/uL (ref 4.22–5.81)
RDW: 13.6 % (ref 11.5–15.5)
WBC Count: 18 10*3/uL — ABNORMAL HIGH (ref 4.0–10.5)
nRBC: 0 % (ref 0.0–0.2)

## 2020-03-27 LAB — CMP (CANCER CENTER ONLY)
ALT: 194 U/L — ABNORMAL HIGH (ref 0–44)
AST: 62 U/L — ABNORMAL HIGH (ref 15–41)
Albumin: 3.6 g/dL (ref 3.5–5.0)
Alkaline Phosphatase: 73 U/L (ref 38–126)
Anion gap: 10 (ref 5–15)
BUN: 27 mg/dL — ABNORMAL HIGH (ref 6–20)
CO2: 24 mmol/L (ref 22–32)
Calcium: 9 mg/dL (ref 8.9–10.3)
Chloride: 103 mmol/L (ref 98–111)
Creatinine: 1.06 mg/dL (ref 0.61–1.24)
GFR, Estimated: >60 mL/min (ref >60)
Glucose, Bld: 156 mg/dL — ABNORMAL HIGH (ref 70–99)
Potassium: 4.3 mmol/L (ref 3.5–5.1)
Sodium: 137 mmol/L (ref 135–145)
Total Bilirubin: 1.1 mg/dL (ref 0.3–1.2)
Total Protein: 6.4 g/dL — ABNORMAL LOW (ref 6.5–8.1)

## 2020-03-27 MED ORDER — DEXAMETHASONE 4 MG PO TABS: 4.0000 mg | ORAL_TABLET | Freq: Every day | ORAL | 3 refills | Status: DC

## 2020-03-27 MED ORDER — LEVETIRACETAM 1000 MG PO TABS: 1000.0000 mg | ORAL_TABLET | Freq: Two times a day (BID) | ORAL | 3 refills | Status: DC

## 2020-03-27 NOTE — Progress Notes (Signed)
Carelink Summary Report / Loop Recorder 

## 2020-03-27 NOTE — Progress Notes (Signed)
[facility] at [place], [place] [address] [phone]   Interval Evaluation  Date of Service: 03/27/20 Patient Name: John Watson Patient MRN: [id number] Patient DOB: Dec 30, 1960 Provider: Ventura Sellers, MD  Identifying Statement:  John Watson is a 59 y.o. male with right frontal glioblastoma  Referring Provider: [name], [place] [address] [place],  [place] [address]  Oncologic History: 02/13/20: Sterotactic biopsy with Dr. Reatha Armour; path demonstrates glioblastoma  Biomarkers:  MGMT Unknown.  IDH 1/2 Wild type.  EGFR Unknown  TERT Unknown   Interval History:  John Watson present today for evaluation now having completed 2 weeks of radiation and temodar.  He complains of loss of taste sensation since starting the chemo.  Otherwise no significant issues with the treatment.  Sleep has been poor/interrupted, but he is dosing 85m of decadron still twice per day including the evening.  No further seizures.  H+P (03/06/20) Patient presented to medical attention in mid-September after experiencing first ever seizures.  Events were described as mainly left sided shaking with post-seizure weakness.  CNS imaging demonstrated enhancing mass in the right frontal lobe consistent with primary brain tumor.  He underwent biopsy with Dr. DReatha Armouron 02/13/20; path was sent out and ultimately confirmed glioblastoma.  Today he complains of increasing weakness of the left arm and leg.  He is walking a little on his own, but for the most part is requiring support to ambulate from family or walker.  His left hand he is not using as much, but he is particularly weak at the left shoulder.  This has worsened somewhat since surgery.  Otherwise no recurrence of seizure episodes, maintains compliance with Keppra.  Not dosing decadron currently.  Medications: Current Outpatient Medications on File Prior to Visit  Medication Sig  Dispense Refill  . atorvastatin (LIPITOR) 80 MG tablet Take 1 tablet (80 mg total) by mouth daily. 30 tablet 2  . Baclofen 5 MG TABS Take 5 mg by mouth 3 (three) times daily as needed. 90 tablet 1  . dexamethasone (DECADRON) 4 MG tablet Take 1 tablet (4 mg total) by mouth 2 (two) times daily with a meal. 30 tablet 3  . levETIRAcetam (KEPPRA) 1000 MG tablet Take 1 tablet (1,000 mg total) by mouth 2 (two) times daily. 60 tablet 0  . ondansetron (ZOFRAN) 8 MG tablet Take 1 tablet (8 mg total) by mouth 2 (two) times daily as needed (nausea and vomiting). May take 30-60 minutes prior to Temodar administration if nausea/vomiting occurs. 30 tablet 1  . temozolomide (TEMODAR) 140 MG capsule Take 1 capsule (140 mg total) by mouth daily. May take on an empty stomach to decrease nausea & vomiting. 42 capsule 0   No current facility-administered medications on file prior to visit.    Allergies:  Allergies  Allergen Reactions  . Vicodin [Hydrocodone-Acetaminophen] Anaphylaxis   Past Medical History:  Past Medical History:  Diagnosis Date  . [place] 02/13/[place]  . Pneumonia 2012  . Seizure ([facility] 8/16/211   last one 02/02/2020  . Stroke ([facility]    Left leg weakness   Past Surgical History:  Past Surgical History:  Procedure Laterality Date  . APPLICATION OF CRANIAL NAVIGATION Right 02/13/2020   Procedure: APPLICATION OF CRANIAL NAVIGATION;  Surgeon: [name], TTheodoro Doing DO;  [facility]: [facility]  Service: Neurosurgery;  Laterality: Right;  APPLICATION OF CRANIAL NAVIGATION  . BUBBLE STUDY  01/17/2020   Procedure:  BUBBLE STUDY;  Surgeon: [name], MD;  Location: [facility];  Service: Cardiovascular;;  . FRAMELESS  BIOPSY WITH BRAINLAB Right 02/13/2020   Procedure: STEREOTACTIC BRAIN BIOPSY OF RIGHT SIDED LESION;  Surgeon: [name], DO;  Location: [facility];  Service: Neurosurgery;  Laterality: Right;  STEREOTACTIC BRAIN BIOPSY OF RIGHT SIDED LESION  . LOOP RECORDER INSERTION N/A  01/17/2020   Procedure: LOOP RECORDER INSERTION;  Surgeon: [name], MD;  Location: [facility] CV LAB;  Service: Cardiovascular;  Laterality: N/A;  . TEE WITHOUT CARDIOVERSION N/A 01/17/2020   Procedure: TRANSESOPHAGEAL ECHOCARDIOGRAM (TEE);  Surgeon: [name], MD;  Location: [facility] ENDOSCOPY;  Service: Cardiovascular;  Laterality: N/A;   Social History:  Social History   Socioeconomic History  . Marital status: Single    Spouse name: Not on file  . Number of children: 1  . Years of education: Not on file  . Highest education level: Not on file  Occupational History  . Not on file  Tobacco Use  . Smoking status: Former Smoker    Years: 2.00    Types: Cigars    Quit date: 2002    Years since quitting: 19.8  . Smokeless tobacco: Never Used  Vaping Use  . Vaping Use: Never used  Substance and Sexual Activity  . Alcohol use: Yes    Alcohol/week: 1.0 standard drink    Types: 1 Cans of beer per week    Comment: occasionally  . Drug use: No  . Sexual activity: Yes    Birth control/protection: None  Other Topics Concern  . Not on file  Social History Narrative  . Not on file   Social Determinants of Health   Financial Resource Strain:   . Difficulty of Paying Living Expenses: Not on file  Food Insecurity:   . Worried About Charity fundraiser in the Last Year: Not on file  . Ran Out of Food in the Last Year: Not on file  Transportation Needs:   . Lack of Transportation (Medical): Not on file  . Lack of Transportation (Non-Medical): Not on file  Physical Activity:   . Days of Exercise per Week: Not on file  . Minutes of Exercise per Session: Not on file  Stress:   . Feeling of Stress : Not on file  Social Connections:   . Frequency of Communication with Friends and Family: Not on file  . Frequency of Social Gatherings with Friends and Family: Not on file  . Attends Religious Services: Not on file  . Active Member of Clubs or Organizations: Not on  file  . Attends Archivist Meetings: Not on file  . Marital Status: Not on file  Intimate Partner Violence:   . Fear of Current or Ex-Partner: Not on file  . Emotionally Abused: Not on file  . Physically Abused: Not on file  . Sexually Abused: Not on file   Family History:  Family History  Problem Relation Age of Onset  . Hypertension Mother   . Lung cancer Father   . Hyperlipidemia Sister   . Hyperlipidemia Brother     Review of Systems: Constitutional: Doesn't report fevers, chills or abnormal weight loss Eyes: Doesn't report blurriness of vision Ears, nose, mouth, throat, and face: Doesn't report sore throat Respiratory: Doesn't report cough, dyspnea or wheezes Cardiovascular: Doesn't report palpitation, chest discomfort  Gastrointestinal:  Doesn't report nausea, constipation, diarrhea GU: Doesn't report incontinence Skin: Doesn't report skin rashes Neurological: Per HPI Musculoskeletal: Doesn't  report joint pain Behavioral/Psych: Doesn't report anxiety  Physical Exam: Vitals:   03/27/20 1208  BP: 133/73  Pulse: 89  Resp: 18  Temp: (!) 97.5 F (36.4 C)  SpO2: 99%   KPS: 70. General: Alert, cooperative, pleasant, in no acute distress Head: Normal EENT: No conjunctival injection or scleral icterus.  Lungs: Resp effort normal Cardiac: Regular rate Abdomen: Non-distended abdomen Skin: No rashes cyanosis or petechiae. Extremities: No clubbing or edema  Neurologic Exam: Mental Status: Awake, alert, attentive to examiner. Oriented to self and environment. Language is fluent with intact comprehension.  Cranial Nerves: Visual acuity is grossly normal. Visual fields are full. Extra-ocular movements intact. No ptosis. Face is symmetric Motor: Tone and bulk are normal. Power is 4/5 in left arm and leg, proximal>distal weakness in arm. Reflexes are symmetric, no pathologic reflexes present.  Sensory: Intact to light touch Gait: Hemiparetic  Labs: I have  reviewed the data as listed    Component Value Date/Time   NA 140 02/14/2020 1122   K 3.8 02/14/2020 1122   CL 103 02/14/2020 1122   CO2 23 02/14/2020 1122   GLUCOSE 129 (H) 02/14/2020 1122   BUN 11 02/14/2020 1122   CREATININE 1.29 (H) 02/14/2020 1122   CALCIUM 9.8 02/14/2020 1122   PROT 6.5 02/02/2020 0316   ALBUMIN 3.7 02/02/2020 0316   AST 13 (L) 02/02/2020 0316   ALT 17 02/02/2020 0316   ALKPHOS 54 02/02/2020 0316   BILITOT 1.1 02/02/2020 0316   GFRNONAA >60 02/14/2020 1122   GFRAA >60 02/14/2020 1122   Lab Results  Component Value Date   WBC 18.0 (H) 03/27/2020   NEUTROABS 15.1 (H) 03/27/2020   HGB 13.0 03/27/2020   HCT 39.3 03/27/2020   MCV 92.5 03/27/2020   PLT 199 03/27/2020     Assessment/Plan Glioblastoma of frontal lobe (HCC) [C71.1]  John Watson is clinically stable today, now having completed 2 weeks of IMRT+TMZ.  We ultimately recommended continuing with course of intensity modulated radiation therapy and concurrent daily Temozolomide.  Radiation will be administered Mon-Fri over 6 weeks, Temodar will be dosed at 77m/m2 to be given daily over 42 days.  We reviewed side effects of temodar, including fatigue, nausea/vomiting, constipation, and cytopenias.  Chemotherapy should be held for the following:  ANC less than 1,000  Platelets less than 100,000  LFT or creatinine greater than 2x ULN  If clinical concerns/contraindications develop  Every 2 weeks during radiation, labs will be checked accompanied by a clinical evaluation in the brain tumor clinic.  We recommended decreasing decadron to 449mdaily.  He will return to clinic in 2 weeks with labs for evaluation.  All questions were answered. The patient knows to call the clinic with any problems, questions or concerns. No barriers to learning were detected.  The total time spent in the encounter was 30 minutes and more than 50% was on counseling and review of test results   ZaVentura SellersMD Medical Director of Neuro-Oncology [facility]0/28/21 12:24 PM

## 2020-03-28 ENCOUNTER — Other Ambulatory Visit: Payer: Self-pay

## 2020-03-28 ENCOUNTER — Ambulatory Visit
Admission: RE | Admit: 2020-03-28 | Discharge: 2020-03-28 | Disposition: A | Discharge status: Home / Self Care | Payer: Self-pay | Source: Ambulatory Visit | Attending: Radiation Oncology | Admitting: Radiation Oncology

## 2020-03-31 ENCOUNTER — Encounter (HOSPITAL_COMMUNITY): Payer: Self-pay | Admitting: Physical Therapy

## 2020-03-31 ENCOUNTER — Other Ambulatory Visit: Payer: Self-pay

## 2020-03-31 ENCOUNTER — Ambulatory Visit
Admission: RE | Admit: 2020-03-31 | Discharge: 2020-03-31 | Disposition: A | Discharge status: Home / Self Care | Payer: Self-pay | Source: Ambulatory Visit | Attending: Radiation Oncology | Admitting: Radiation Oncology

## 2020-03-31 DIAGNOSIS — C711 Malignant neoplasm of frontal lobe: Secondary | ICD-10-CM | POA: Insufficient documentation

## 2020-03-31 DIAGNOSIS — Z510 Encounter for antineoplastic radiation therapy: Secondary | ICD-10-CM | POA: Insufficient documentation

## 2020-03-31 NOTE — Therapy (Signed)
[facility] [place], [place], [address] Phone: [phone]   Fax:  [phone]  Patient Details  Name: John Watson MRN: [id number] Date of Birth: 20-May-1961 Referring Provider:  No ref. provider found  Encounter Date: 03/31/2020   PHYSICAL THERAPY DISCHARGE SUMMARY  Visits from Start of Care: 2  Current functional level related to goals / functional outcomes: Unknown as patient has not returned.   Remaining deficits: Unknown as patient has not returned.      Education / Equipment: exam findings, POC, scope of PT, remaining active   Plan: Patient agrees to discharge.  Patient goals were not met. Patient is being discharged due to the patient's request.  ?????      2:44 PM, 03/31/20 Mearl Latin PT, DPT Physical Therapist at [place], [place], [address] Phone: [phone]   Fax:  [phone]

## 2020-04-01 ENCOUNTER — Inpatient Hospital Stay: Payer: Self-pay | Admitting: Neurology

## 2020-04-01 ENCOUNTER — Other Ambulatory Visit: Payer: Self-pay

## 2020-04-01 ENCOUNTER — Ambulatory Visit (HOSPITAL_COMMUNITY): Payer: Self-pay | Admitting: Physical Therapy

## 2020-04-01 ENCOUNTER — Ambulatory Visit
Admission: RE | Admit: 2020-04-01 | Discharge: 2020-04-01 | Disposition: A | Discharge status: Home / Self Care | Payer: Self-pay | Source: Ambulatory Visit | Attending: Radiation Oncology | Admitting: Radiation Oncology

## 2020-04-02 ENCOUNTER — Ambulatory Visit
Admission: RE | Admit: 2020-04-02 | Discharge: 2020-04-02 | Disposition: A | Discharge status: Home / Self Care | Payer: Self-pay | Source: Ambulatory Visit | Attending: Radiation Oncology | Admitting: Radiation Oncology

## 2020-04-02 ENCOUNTER — Encounter: Payer: Self-pay | Admitting: Internal Medicine

## 2020-04-02 ENCOUNTER — Ambulatory Visit (HOSPITAL_COMMUNITY): Payer: Self-pay | Admitting: Occupational Therapy

## 2020-04-03 ENCOUNTER — Encounter (HOSPITAL_COMMUNITY): Payer: Self-pay

## 2020-04-03 ENCOUNTER — Other Ambulatory Visit: Payer: Self-pay

## 2020-04-03 ENCOUNTER — Ambulatory Visit
Admission: RE | Admit: 2020-04-03 | Discharge: 2020-04-03 | Disposition: A | Discharge status: Home / Self Care | Payer: Self-pay | Source: Ambulatory Visit | Attending: Radiation Oncology | Admitting: Radiation Oncology

## 2020-04-03 ENCOUNTER — Ambulatory Visit (HOSPITAL_COMMUNITY): Payer: Self-pay | Admitting: Physical Therapy

## 2020-04-03 NOTE — Therapy (Signed)
[facility], [place], [address] Phone: [phone]   Fax:  [phone]  Patient Details  Name: John Watson MRN: [id number] Date of Birth: March 04, 1961 Referring Provider:  No ref. provider found  Encounter Date: 04/03/2020  OCCUPATIONAL THERAPY DISCHARGE SUMMARY  Visits from Start of Care: 1  Current functional level related to goals / functional outcomes: Unknown. Patient requested to be discharged. Did not return to therapy after OT evaluation.   Remaining deficits: See evaluation. All listed deficits remain.    Education / Equipment: See eval. Plan: Patient agrees to discharge.  Patient goals were not met. Patient is being discharged due to the patient's request.  ?????          Ailene Ravel, OTR/L,CBIS  [phone]  04/03/2020, 1:26 PM  [facility] [address] [place], [place], [address] Phone: [phone]   Fax:  [phone]

## 2020-04-04 ENCOUNTER — Encounter (HOSPITAL_COMMUNITY): Payer: Self-pay | Admitting: Occupational Therapy

## 2020-04-04 ENCOUNTER — Ambulatory Visit (HOSPITAL_COMMUNITY): Payer: Self-pay

## 2020-04-04 ENCOUNTER — Inpatient Hospital Stay: Payer: Self-pay | Attending: Neurological Surgery | Admitting: Internal Medicine

## 2020-04-04 ENCOUNTER — Ambulatory Visit
Admission: RE | Admit: 2020-04-04 | Discharge: 2020-04-04 | Disposition: A | Discharge status: Home / Self Care | Payer: Self-pay | Source: Ambulatory Visit | Attending: Radiation Oncology | Admitting: Radiation Oncology

## 2020-04-04 DIAGNOSIS — Z87891 Personal history of nicotine dependence: Secondary | ICD-10-CM | POA: Insufficient documentation

## 2020-04-04 DIAGNOSIS — C711 Malignant neoplasm of frontal lobe: Secondary | ICD-10-CM | POA: Insufficient documentation

## 2020-04-04 DIAGNOSIS — G4089 Other seizures: Secondary | ICD-10-CM | POA: Insufficient documentation

## 2020-04-04 DIAGNOSIS — Z23 Encounter for immunization: Secondary | ICD-10-CM | POA: Insufficient documentation

## 2020-04-04 DIAGNOSIS — R569 Unspecified convulsions: Secondary | ICD-10-CM

## 2020-04-04 NOTE — Progress Notes (Signed)
I connected with John Watson on 04/04/20 at  9:30 AM EDT by telephone visit and verified that I am speaking with the correct person using two identifiers.  I discussed the limitations, risks, security and privacy concerns of performing an evaluation and management service by telemedicine and the availability of in-person appointments. I also discussed with the patient that there may be a patient responsible charge related to this service. The patient expressed understanding and agreed to proceed.  Other persons participating in the visit and their role in the encounter:  n/a  Patient's location:  Home  Provider's location:  Office  Chief Complaint:  Glioblastoma of frontal lobe ([place] [place])  Seizures ([place])  History of Present Ilness: John Watson experienced two breakthrough seizures this week.  They both consisted of stereotypical left leg shaking for 30 seconds followed by some weakness, and eventual return to baseline.  He also complains of relative increase in left sided weakness and imbalance, all in the past week since decreasing decadron down to 4mg  daily.   Observations: Language and cognition at baseline Assessment and Plan: Glioblastoma of frontal lobe (HCC)  Seizures (HCC)  Breakthrough seizures and focal decompensation likely secondary to draw-down of corticosteroids.  Recommended increasing back to 8mg  daily decadron x3 days, then decreasing to 6mg  daily.  Will return to clinic next Thursday  Follow Up Instructions: RTC as scheduled  I discussed the assessment and treatment plan with the patient.  The patient was provided an opportunity to ask questions and all were answered.  The patient agreed with the plan and demonstrated understanding of the instructions.    The patient was advised to call back or seek an in-person evaluation if the symptoms worsen or if the condition fails to improve as anticipated.  I provided 5-10 minutes of non-face-to-face time during this  enocunter.  Ventura Sellers, MD   I provided 15 minutes of non face-to-face telephone visit time during this encounter, and > 50% was spent counseling as documented under my assessment & plan.

## 2020-04-07 ENCOUNTER — Other Ambulatory Visit: Payer: Self-pay

## 2020-04-07 ENCOUNTER — Ambulatory Visit
Admission: RE | Admit: 2020-04-07 | Discharge: 2020-04-07 | Disposition: A | Discharge status: Home / Self Care | Payer: Self-pay | Source: Ambulatory Visit | Attending: Radiation Oncology | Admitting: Radiation Oncology

## 2020-04-07 ENCOUNTER — Ambulatory Visit (HOSPITAL_COMMUNITY): Payer: Self-pay | Admitting: Physical Therapy

## 2020-04-08 ENCOUNTER — Encounter (HOSPITAL_COMMUNITY): Payer: Self-pay | Admitting: Occupational Therapy

## 2020-04-08 ENCOUNTER — Ambulatory Visit
Admission: RE | Admit: 2020-04-08 | Discharge: 2020-04-08 | Disposition: A | Discharge status: Home / Self Care | Payer: Self-pay | Source: Ambulatory Visit | Attending: Radiation Oncology | Admitting: Radiation Oncology

## 2020-04-09 ENCOUNTER — Ambulatory Visit (HOSPITAL_COMMUNITY): Payer: Self-pay | Admitting: Physical Therapy

## 2020-04-09 ENCOUNTER — Ambulatory Visit
Admission: RE | Admit: 2020-04-09 | Discharge: 2020-04-09 | Disposition: A | Discharge status: Home / Self Care | Payer: Self-pay | Source: Ambulatory Visit | Attending: Radiation Oncology | Admitting: Radiation Oncology

## 2020-04-09 ENCOUNTER — Encounter: Payer: Self-pay | Admitting: Internal Medicine

## 2020-04-09 ENCOUNTER — Encounter: Payer: Self-pay | Admitting: *Deleted

## 2020-04-10 ENCOUNTER — Encounter (HOSPITAL_COMMUNITY): Payer: Self-pay | Admitting: Occupational Therapy

## 2020-04-10 ENCOUNTER — Inpatient Hospital Stay: Payer: Self-pay

## 2020-04-10 ENCOUNTER — Ambulatory Visit
Admission: RE | Admit: 2020-04-10 | Discharge: 2020-04-10 | Disposition: A | Discharge status: Home / Self Care | Payer: Self-pay | Source: Ambulatory Visit | Attending: Radiation Oncology | Admitting: Radiation Oncology

## 2020-04-10 ENCOUNTER — Other Ambulatory Visit: Payer: Self-pay

## 2020-04-10 ENCOUNTER — Telehealth: Payer: Self-pay | Admitting: *Deleted

## 2020-04-10 ENCOUNTER — Encounter: Payer: Self-pay | Admitting: Internal Medicine

## 2020-04-10 ENCOUNTER — Inpatient Hospital Stay (HOSPITAL_BASED_OUTPATIENT_CLINIC_OR_DEPARTMENT_OTHER): Payer: Self-pay | Admitting: Internal Medicine

## 2020-04-10 VITALS — BP 110/75 | HR 72 | Temp 97.6°F | Resp 17 | Ht 71.0 in | Wt 183.4 lb

## 2020-04-10 DIAGNOSIS — Z23 Encounter for immunization: Secondary | ICD-10-CM

## 2020-04-10 DIAGNOSIS — R569 Unspecified convulsions: Secondary | ICD-10-CM

## 2020-04-10 DIAGNOSIS — C711 Malignant neoplasm of frontal lobe: Secondary | ICD-10-CM

## 2020-04-10 LAB — CBC WITH DIFFERENTIAL (CANCER CENTER ONLY)
Abs Immature Granulocytes: 0.55 10*3/uL — ABNORMAL HIGH (ref 0.00–0.07)
Basophils Absolute: 0 10*3/uL (ref 0.0–0.1)
Basophils Relative: 0 %
Eosinophils Absolute: 0 10*3/uL (ref 0.0–0.5)
Eosinophils Relative: 0 %
HCT: 40.8 % (ref 39.0–52.0)
Hemoglobin: 13.1 g/dL (ref 13.0–17.0)
Immature Granulocytes: 5 %
Lymphocytes Relative: 11 %
Lymphs Abs: 1.4 10*3/uL (ref 0.7–4.0)
MCH: 31 pg (ref 26.0–34.0)
MCHC: 32.1 g/dL (ref 30.0–36.0)
MCV: 96.5 fL (ref 80.0–100.0)
Monocytes Absolute: 1 10*3/uL (ref 0.1–1.0)
Monocytes Relative: 8 %
Neutro Abs: 9.2 10*3/uL — ABNORMAL HIGH (ref 1.7–7.7)
Neutrophils Relative %: 76 %
Platelet Count: 229 10*3/uL (ref 150–400)
RBC: 4.23 MIL/uL (ref 4.22–5.81)
RDW: 13.8 % (ref 11.5–15.5)
WBC Count: 12.1 10*3/uL — ABNORMAL HIGH (ref 4.0–10.5)
nRBC: 0 % (ref 0.0–0.2)

## 2020-04-10 LAB — CMP (CANCER CENTER ONLY)
ALT: 69 U/L — ABNORMAL HIGH (ref 0–44)
AST: 13 U/L — ABNORMAL LOW (ref 15–41)
Albumin: 3.5 g/dL (ref 3.5–5.0)
Alkaline Phosphatase: 59 U/L (ref 38–126)
Anion gap: 7 (ref 5–15)
BUN: 28 mg/dL — ABNORMAL HIGH (ref 6–20)
CO2: 29 mmol/L (ref 22–32)
Calcium: 9.1 mg/dL (ref 8.9–10.3)
Chloride: 106 mmol/L (ref 98–111)
Creatinine: 1.22 mg/dL (ref 0.61–1.24)
GFR, Estimated: >60 mL/min (ref >60)
Glucose, Bld: 96 mg/dL (ref 70–99)
Potassium: 3.5 mmol/L (ref 3.5–5.1)
Sodium: 142 mmol/L (ref 135–145)
Total Bilirubin: 0.9 mg/dL (ref 0.3–1.2)
Total Protein: 6.7 g/dL (ref 6.5–8.1)

## 2020-04-10 MED ORDER — INFLUENZA VAC SPLIT QUAD 0.5 ML IM SUSY
PREFILLED_SYRINGE | INTRAMUSCULAR | Status: AC
  Filled 2020-04-10: qty 0.5

## 2020-04-10 MED ORDER — INFLUENZA VAC SPLIT QUAD 0.5 ML IM SUSY
0.5000 mL | PREFILLED_SYRINGE | Freq: Once | INTRAMUSCULAR | Status: AC
  Administered 2020-04-10: 0.5 mL via INTRAMUSCULAR

## 2020-04-10 NOTE — Progress Notes (Signed)
Met with patient at registration to obtain signature for [facility] for medication.  Patient approved for grant and has a copy of the approval letter.  He has my card for any additional financial questions or concerns.

## 2020-04-10 NOTE — Telephone Encounter (Signed)
Patient needed update letter for [facility].  Routed via mychart.

## 2020-04-10 NOTE — Progress Notes (Signed)
[facility] at [place], [place] [address] [phone]   Interval Evaluation  Date of Service: 04/10/20 Patient Name: John Watson Patient MRN: [id number] Patient DOB: 03-Sep-1960 Provider: Ventura Sellers, MD  Identifying Statement:  John Watson is a 59 y.o. male with right frontal glioblastoma  Referring Provider: [name], [place],  [place] [address]  Oncologic History: 02/13/20: Sterotactic biopsy with Dr. Reatha Armour; path demonstrates glioblastoma  Biomarkers:  MGMT Unknown.  IDH 1/2 Wild type.  EGFR Unknown  TERT Unknown   Interval History:  John Watson present today for evaluation now having completed 4 weeks of radiation and temodar.  He feels significantly improved with regards to left sided weakness and imbalance since increasing decadron; currently dosing 64m daily as instructed.  Otherwise no significant issues with the treatment.  Sleep has improved from prior.  No further seizures following recent phone visit.  H+P (03/06/20) Patient presented to medical attention in mid-September after experiencing first ever seizures.  Events were described as mainly left sided shaking with post-seizure weakness.  CNS imaging demonstrated enhancing mass in the right frontal lobe consistent with primary brain tumor.  He underwent biopsy with Dr. DReatha Armouron 02/13/20; path was sent out and ultimately confirmed glioblastoma.  Today he complains of increasing weakness of the left arm and leg.  He is walking a little on his own, but for the most part is requiring support to ambulate from family or walker.  His left hand he is not using as much, but he is particularly weak at the left shoulder.  This has worsened somewhat since surgery.  Otherwise no recurrence of seizure episodes, maintains compliance with Keppra.  Not dosing decadron currently.  Medications: Current Outpatient Medications on File  Prior to Visit  Medication Sig Dispense Refill  . atorvastatin (LIPITOR) 80 MG tablet Take 1 tablet (80 mg total) by mouth daily. 30 tablet 2  . Baclofen 5 MG TABS Take 5 mg by mouth 3 (three) times daily as needed. 90 tablet 1  . chlorproMAZINE (THORAZINE) 25 MG tablet Take 25 mg by mouth 3 (three) times daily.    .Marland Kitchendexamethasone (DECADRON) 4 MG tablet Take 1 tablet (4 mg total) by mouth daily. 30 tablet 3  . levETIRAcetam (KEPPRA) 1000 MG tablet Take 1 tablet (1,000 mg total) by mouth 2 (two) times daily. 60 tablet 3  . temozolomide (TEMODAR) 140 MG capsule Take 1 capsule (140 mg total) by mouth daily. May take on an empty stomach to decrease nausea & vomiting. 42 capsule 0  . ondansetron (ZOFRAN) 8 MG tablet Take 1 tablet (8 mg total) by mouth 2 (two) times daily as needed (nausea and vomiting). May take 30-60 minutes prior to Temodar administration if nausea/vomiting occurs. (Patient not taking: Reported on 03/27/2020) 30 tablet 1   No current facility-administered medications on file prior to visit.    Allergies:  Allergies  Allergen Reactions  . Vicodin [Hydrocodone-Acetaminophen] Anaphylaxis   Past Medical History:  Past Medical History:  Diagnosis Date  . Glioblastoma ([facility] 02/13/2020  . Pneumonia 2012  . [place]ure ([facility] 8/16[place]211   last one 02/02/2020  . Stroke ([facility]    Left leg weakness   Past Surgical History:  Past Surgical History:  Procedure Laterality Date  . APPLICATION OF CRANIAL NAVIGATION Right 02/13/2020   Procedure: APPLICATION OF CRANIAL NAVIGATION;  Surgeon: DKarsten Ro DO;  [facility]  Service: Neurosurgery;  Laterality: Right;  APPLICATION OF CRANIAL NAVIGATION  . BUBBLE STUDY  01/17/2020   Procedure: BUBBLE STUDY;  Surgeon: [name], MD;  Location: [facility];  Service: Cardiovascular;;  . FRAMELESS  BIOPSY WITH BRAINLAB Right 02/13/2020   Procedure: STEREOTACTIC BRAIN BIOPSY OF RIGHT SIDED LESION;  Surgeon: [name], DO;   Location: [facility];  Service: Neurosurgery;  Laterality: Right;  STEREOTACTIC BRAIN BIOPSY OF RIGHT SIDED LESION  . LOOP RECORDER INSERTION N/A 01/17/2020   Procedure: LOOP RECORDER INSERTION;  Surgeon: [name], MD;  Location: [facility] CV LAB;  Service: Cardiovascular;  Laterality: N/A;  . TEE WITHOUT CARDIOVERSION N/A 01/17/2020   Procedure: TRANSESOPHAGEAL ECHOCARDIOGRAM (TEE);  Surgeon: [name], MD;  Location: [facility] ENDOSCOPY;  Service: Cardiovascular;  Laterality: N/A;   Social History:  Social History   Socioeconomic History  . Marital status: Single    Spouse name: Not on file  . Number of children: 1  . Years of education: Not on file  . Highest education level: Not on file  Occupational History  . Not on file  Tobacco Use  . Smoking status: Former Smoker    Years: 2.00    Types: Cigars    Quit date: 2002    Years since quitting: 19.8  . Smokeless tobacco: Never Used  Vaping Use  . Vaping Use: Never used  Substance and Sexual Activity  . Alcohol use: Yes    Alcohol/week: 1.0 standard drink    Types: 1 Cans of beer per week    Comment: occasionally  . Drug use: No  . Sexual activity: Yes    Birth control/protection: None  Other Topics Concern  . Not on file  Social History Narrative  . Not on file   Social Determinants of Health   Financial Resource Strain:   . Difficulty of Paying Living Expenses: Not on file  Food Insecurity:   . Worried About Charity fundraiser in the Last Year: Not on file  . Ran Out of Food in the Last Year: Not on file  Transportation Needs:   . Lack of Transportation (Medical): Not on file  . Lack of Transportation (Non-Medical): Not on file  Physical Activity:   . Days of Exercise per Week: Not on file  . Minutes of Exercise per Session: Not on file  Stress:   . Feeling of Stress : Not on file  Social Connections:   . Frequency of Communication with Friends and Family: Not on file  . Frequency of Social  Gatherings with Friends and Family: Not on file  . Attends Religious Services: Not on file  . Active Member of Clubs or Organizations: Not on file  . Attends Archivist Meetings: Not on file  . Marital Status: Not on file  Intimate Partner Violence:   . Fear of Current or Ex-Partner: Not on file  . Emotionally Abused: Not on file  . Physically Abused: Not on file  . Sexually Abused: Not on file   Family History:  Family History  Problem Relation Age of Onset  . Hypertension Mother   . Lung cancer Father   . Hyperlipidemia Sister   . Hyperlipidemia Brother     Review of Systems: Constitutional: Doesn't report fevers, chills or abnormal weight loss Eyes: Doesn't report blurriness of vision Ears, nose, mouth, throat, and face: Doesn't report sore throat Respiratory: Doesn't report cough, dyspnea or wheezes Cardiovascular: Doesn't report palpitation, chest discomfort  Gastrointestinal:  Doesn't report nausea, constipation, diarrhea GU: Doesn't report incontinence Skin: Doesn't report skin rashes Neurological: Per HPI Musculoskeletal: Doesn't report joint pain Behavioral/Psych: Doesn't report anxiety  Physical Exam: Vitals:   04/10/20 1212  BP: 110/75  Pulse: 72  Resp: 17  Temp: 97.6 F (36.4 C)  SpO2: 100%   KPS: 70. General: Alert, cooperative, pleasant, in no acute distress Head: Normal EENT: No conjunctival injection or scleral icterus.  Lungs: Resp effort normal Cardiac: Regular rate Abdomen: Non-distended abdomen Skin: No rashes cyanosis or petechiae. Extremities: No clubbing or edema  Neurologic Exam: Mental Status: Awake, alert, attentive to examiner. Oriented to self and environment. Language is fluent with intact comprehension.  Cranial Nerves: Visual acuity is grossly normal. Visual fields are full. Extra-ocular movements intact. No ptosis. Face is symmetric Motor: Tone and bulk are normal. Power is 4/5 in left arm and leg, proximal>distal  weakness in arm. Reflexes are symmetric, no pathologic reflexes present.  Sensory: Intact to light touch Gait: Hemiparetic  Labs: I have reviewed the data as listed    Component Value Date/Time   NA 137 03/27/2020 1144   K 4.3 03/27/2020 1144   CL 103 03/27/2020 1144   CO2 24 03/27/2020 1144   GLUCOSE 156 (H) 03/27/2020 1144   BUN 27 (H) 03/27/2020 1144   CREATININE 1.06 03/27/2020 1144   CALCIUM 9.0 03/27/2020 1144   PROT 6.4 (L) 03/27/2020 1144   ALBUMIN 3.6 03/27/2020 1144   AST 62 (H) 03/27/2020 1144   ALT 194 (H) 03/27/2020 1144   ALKPHOS 73 03/27/2020 1144   BILITOT 1.1 03/27/2020 1144   GFRNONAA >60 03/27/2020 1144   GFRAA >60 02/14/2020 1122   Lab Results  Component Value Date   WBC 12.1 (H) 04/10/2020   NEUTROABS 9.2 (H) 04/10/2020   HGB 13.1 04/10/2020   HCT 40.8 04/10/2020   MCV 96.5 04/10/2020   PLT 229 04/10/2020     Assessment/Plan Glioblastoma of frontal lobe (HCC) [C71.1]  John Watson is clinically stable today, now having completed 4 weeks of IMRT+TMZ.  He has responded well to increased corticosteroid therapy.  We ultimately recommended continuing with course of intensity modulated radiation therapy and concurrent daily Temozolomide.  Radiation will be administered Mon-Fri over 6 weeks, Temodar will be dosed at 41m/m2 to be given daily over 42 days.  We reviewed side effects of temodar, including fatigue, nausea/vomiting, constipation, and cytopenias.  Chemotherapy should be held for the following:  ANC less than 1,000  Platelets less than 100,000  LFT or creatinine greater than 2x ULN  If clinical concerns/contraindications develop  Every 2 weeks during radiation, labs will be checked accompanied by a clinical evaluation in the brain tumor clinic.  We recommended continuing decadron at 639mdaily.  He will return to clinic in 2 weeks with labs for evaluation.  All questions were answered. The patient knows to call the clinic with any  problems, questions or concerns. No barriers to learning were detected.  The total time spent in the encounter was 30 minutes and more than 50% was on counseling and review of test results   ZaVentura SellersMD Medical Director of Neuro-Oncology [facility]1/11/21 12:08 PM

## 2020-04-11 ENCOUNTER — Ambulatory Visit
Admission: RE | Admit: 2020-04-11 | Discharge: 2020-04-11 | Disposition: A | Discharge status: Home / Self Care | Payer: Self-pay | Source: Ambulatory Visit | Attending: Radiation Oncology | Admitting: Radiation Oncology

## 2020-04-11 ENCOUNTER — Other Ambulatory Visit: Payer: Self-pay

## 2020-04-11 NOTE — Patient Outreach (Signed)
[facility]) Care Management  04/11/2020  AQUILLA VOILES 1961/03/01 [id number]   First telephone outreach attempt to obtain mRS. No answer. Left message for returned call.  Philmore Pali under supervision of [employer] [facility] Management Assistant [phone]

## 2020-04-14 ENCOUNTER — Ambulatory Visit (HOSPITAL_COMMUNITY): Payer: Self-pay | Admitting: Physical Therapy

## 2020-04-14 ENCOUNTER — Ambulatory Visit
Admission: RE | Admit: 2020-04-14 | Discharge: 2020-04-14 | Disposition: A | Discharge status: Home / Self Care | Payer: Self-pay | Source: Ambulatory Visit | Attending: Radiation Oncology | Admitting: Radiation Oncology

## 2020-04-15 ENCOUNTER — Ambulatory Visit
Admission: RE | Admit: 2020-04-15 | Discharge: 2020-04-15 | Disposition: A | Discharge status: Home / Self Care | Payer: Self-pay | Source: Ambulatory Visit | Attending: Radiation Oncology | Admitting: Radiation Oncology

## 2020-04-15 ENCOUNTER — Encounter (HOSPITAL_COMMUNITY): Payer: Self-pay | Admitting: *Deleted

## 2020-04-15 ENCOUNTER — Other Ambulatory Visit: Payer: Self-pay

## 2020-04-15 NOTE — Progress Notes (Signed)
[facility] 90 - 04/15/20 0900      Assessment    Assessment type Phone to patient   No answer, voicemail left

## 2020-04-16 ENCOUNTER — Encounter (HOSPITAL_COMMUNITY): Payer: Self-pay | Admitting: Occupational Therapy

## 2020-04-16 ENCOUNTER — Ambulatory Visit
Admission: RE | Admit: 2020-04-16 | Discharge: 2020-04-16 | Disposition: A | Discharge status: Home / Self Care | Payer: Self-pay | Source: Ambulatory Visit | Attending: Radiation Oncology | Admitting: Radiation Oncology

## 2020-04-16 ENCOUNTER — Ambulatory Visit (HOSPITAL_COMMUNITY): Payer: Self-pay | Admitting: Physical Therapy

## 2020-04-17 ENCOUNTER — Other Ambulatory Visit: Payer: Self-pay

## 2020-04-17 ENCOUNTER — Encounter: Payer: Self-pay | Admitting: *Deleted

## 2020-04-17 ENCOUNTER — Ambulatory Visit
Admission: RE | Admit: 2020-04-17 | Discharge: 2020-04-17 | Disposition: A | Discharge status: Home / Self Care | Payer: Self-pay | Source: Ambulatory Visit | Attending: Radiation Oncology | Admitting: Radiation Oncology

## 2020-04-17 NOTE — Progress Notes (Unsigned)
[facility] - 04/17/20 1500      Assessment    Assessment type Other   No answer, left voicemail

## 2020-04-18 ENCOUNTER — Encounter (HOSPITAL_COMMUNITY): Payer: Self-pay | Admitting: Occupational Therapy

## 2020-04-18 ENCOUNTER — Other Ambulatory Visit: Payer: Self-pay

## 2020-04-18 ENCOUNTER — Ambulatory Visit
Admission: RE | Admit: 2020-04-18 | Discharge: 2020-04-18 | Disposition: A | Discharge status: Home / Self Care | Payer: Self-pay | Source: Ambulatory Visit | Attending: Radiation Oncology | Admitting: Radiation Oncology

## 2020-04-18 MED FILL — ONDANSETRON HCL 8 MG TABLET: 8 | 30 days supply | Qty: 30 | Fill #0

## 2020-04-18 NOTE — Patient Outreach (Signed)
[facility]) Care Management  04/18/2020  CARNEY SAXTON 09-13-1960 [id number]   Second telephone outreach attempt to obtain mRS. No answer. Left message for returned call.  Philmore Pali [facility] Management Assistant [phone]

## 2020-04-20 ENCOUNTER — Ambulatory Visit
Admission: RE | Admit: 2020-04-20 | Discharge: 2020-04-20 | Disposition: A | Discharge status: Home / Self Care | Payer: Self-pay | Source: Ambulatory Visit | Attending: Radiation Oncology | Admitting: Radiation Oncology

## 2020-04-20 ENCOUNTER — Other Ambulatory Visit: Payer: Self-pay

## 2020-04-21 ENCOUNTER — Ambulatory Visit
Admission: RE | Admit: 2020-04-21 | Discharge: 2020-04-21 | Disposition: A | Discharge status: Home / Self Care | Payer: Self-pay | Source: Ambulatory Visit | Attending: Radiation Oncology | Admitting: Radiation Oncology

## 2020-04-21 ENCOUNTER — Other Ambulatory Visit: Payer: Self-pay

## 2020-04-21 ENCOUNTER — Ambulatory Visit (HOSPITAL_COMMUNITY): Payer: Self-pay

## 2020-04-22 ENCOUNTER — Ambulatory Visit
Admission: RE | Admit: 2020-04-22 | Discharge: 2020-04-22 | Disposition: A | Discharge status: Home / Self Care | Payer: Self-pay | Source: Ambulatory Visit | Attending: Radiation Oncology | Admitting: Radiation Oncology

## 2020-04-22 ENCOUNTER — Other Ambulatory Visit: Payer: Self-pay

## 2020-04-22 ENCOUNTER — Ambulatory Visit: Admission: RE | Admit: 2020-04-22 | Payer: Self-pay | Source: Ambulatory Visit

## 2020-04-22 ENCOUNTER — Inpatient Hospital Stay (HOSPITAL_BASED_OUTPATIENT_CLINIC_OR_DEPARTMENT_OTHER): Payer: Self-pay | Admitting: Internal Medicine

## 2020-04-22 ENCOUNTER — Inpatient Hospital Stay: Payer: Self-pay

## 2020-04-22 VITALS — BP 129/67 | HR 81 | Temp 97.0°F | Resp 18 | Ht 71.0 in | Wt 187.6 lb

## 2020-04-22 DIAGNOSIS — C711 Malignant neoplasm of frontal lobe: Secondary | ICD-10-CM

## 2020-04-22 DIAGNOSIS — R569 Unspecified convulsions: Secondary | ICD-10-CM

## 2020-04-22 LAB — CBC WITH DIFFERENTIAL (CANCER CENTER ONLY)
Abs Immature Granulocytes: 1.29 10*3/uL — ABNORMAL HIGH (ref 0.00–0.07)
Basophils Absolute: 0.1 10*3/uL (ref 0.0–0.1)
Basophils Relative: 0 %
Eosinophils Absolute: 0.1 10*3/uL (ref 0.0–0.5)
Eosinophils Relative: 1 %
HCT: 39.5 % (ref 39.0–52.0)
Hemoglobin: 12.9 g/dL — ABNORMAL LOW (ref 13.0–17.0)
Immature Granulocytes: 8 %
Lymphocytes Relative: 7 %
Lymphs Abs: 1.1 10*3/uL (ref 0.7–4.0)
MCH: 31.6 pg (ref 26.0–34.0)
MCHC: 32.7 g/dL (ref 30.0–36.0)
MCV: 96.8 fL (ref 80.0–100.0)
Monocytes Absolute: 1.3 10*3/uL — ABNORMAL HIGH (ref 0.1–1.0)
Monocytes Relative: 8 %
Neutro Abs: 12.2 10*3/uL — ABNORMAL HIGH (ref 1.7–7.7)
Neutrophils Relative %: 76 %
Platelet Count: 201 10*3/uL (ref 150–400)
RBC: 4.08 MIL/uL — ABNORMAL LOW (ref 4.22–5.81)
RDW: 14.4 % (ref 11.5–15.5)
WBC Count: 16 10*3/uL — ABNORMAL HIGH (ref 4.0–10.5)
nRBC: 0.3 % — ABNORMAL HIGH (ref 0.0–0.2)

## 2020-04-22 LAB — CMP (CANCER CENTER ONLY)
ALT: 53 U/L — ABNORMAL HIGH (ref 0–44)
AST: 17 U/L (ref 15–41)
Albumin: 3.3 g/dL — ABNORMAL LOW (ref 3.5–5.0)
Alkaline Phosphatase: 55 U/L (ref 38–126)
Anion gap: 11 (ref 5–15)
BUN: 21 mg/dL — ABNORMAL HIGH (ref 6–20)
CO2: 24 mmol/L (ref 22–32)
Calcium: 8.6 mg/dL — ABNORMAL LOW (ref 8.9–10.3)
Chloride: 103 mmol/L (ref 98–111)
Creatinine: 1.12 mg/dL (ref 0.61–1.24)
GFR, Estimated: >60 mL/min (ref >60)
Glucose, Bld: 158 mg/dL — ABNORMAL HIGH (ref 70–99)
Potassium: 3.9 mmol/L (ref 3.5–5.1)
Sodium: 138 mmol/L (ref 135–145)
Total Bilirubin: 0.9 mg/dL (ref 0.3–1.2)
Total Protein: 6.2 g/dL — ABNORMAL LOW (ref 6.5–8.1)

## 2020-04-22 MED ORDER — BACLOFEN 10 MG PO TABS: 10.0000 mg | ORAL_TABLET | Freq: Three times a day (TID) | ORAL | 1 refills | Status: DC | PRN

## 2020-04-22 NOTE — Progress Notes (Signed)
[place] [facility] [place], [place] [address] [phone]   Interval Evaluation  Date of Service: 04/22/20 Patient Name: John Watson Patient MRN: [id number] Patient DOB: July 19, 1960 Provider: Ventura Sellers, MD  Identifying Statement:  John Watson is a 59 y.o. male with right frontal glioblastoma  Referring Provider: [name], [place],  [place] [address]  Oncologic History: 02/13/20: Sterotactic biopsy with Dr. Reatha Armour; path demonstrates glioblastoma  Biomarkers:  MGMT Unknown.  IDH 1/2 Wild type.  EGFR Unknown  TERT Unknown   Interval History:  John Watson present today for evaluation now having completed 6 weeks of radiation and temodar.  Continues to describe stable left sided dysfunction, weakness; currently dosing 49m decadron daily as instructed.  Otherwise no significant issues with the treatment. No further seizures following recent phone visit.  H+P (03/06/20) Patient presented to medical attention in mid-September after experiencing first ever seizures.  Events were described as mainly left sided shaking with post-seizure weakness.  CNS imaging demonstrated enhancing mass in the right frontal lobe consistent with primary brain tumor.  He underwent biopsy with Dr. DReatha Armouron 02/13/20; path was sent out and ultimately confirmed glioblastoma.  Today he complains of increasing weakness of the left arm and leg.  He is walking a little on his own, but for the most part is requiring support to ambulate from family or walker.  His left hand he is not using as much, but he is particularly weak at the left shoulder.  This has worsened somewhat since surgery.  Otherwise no recurrence of seizure episodes, maintains compliance with Keppra.  Not dosing decadron currently.  Medications: Current Outpatient Medications on File Prior to Visit  Medication Sig Dispense Refill  . atorvastatin  (LIPITOR) 80 MG tablet Take 1 tablet (80 mg total) by mouth daily. 30 tablet 2  . Baclofen 5 MG TABS Take 5 mg by mouth 3 (three) times daily as needed. 90 tablet 1  . chlorproMAZINE (THORAZINE) 25 MG tablet Take 25 mg by mouth 3 (three) times daily.    .Marland Kitchendexamethasone (DECADRON) 4 MG tablet Take 1 tablet (4 mg total) by mouth daily. 30 tablet 3  . levETIRAcetam (KEPPRA) 1000 MG tablet Take 1 tablet (1,000 mg total) by mouth 2 (two) times daily. 60 tablet 3  . temozolomide (TEMODAR) 140 MG capsule Take 1 capsule (140 mg total) by mouth daily. May take on an empty stomach to decrease nausea & vomiting. 42 capsule 0  . ondansetron (ZOFRAN) 8 MG tablet Take 1 tablet (8 mg total) by mouth 2 (two) times daily as needed (nausea and vomiting). May take 30-60 minutes prior to Temodar administration if nausea/vomiting occurs. (Patient not taking: Reported on 03/27/2020) 30 tablet 1   No current facility-administered medications on file prior to visit.    Allergies:  Allergies  Allergen Reactions  . Vicodin [Hydrocodone-Acetaminophen] Anaphylaxis   Past Medical History:  Past Medical History:  Diagnosis Date  . Glioblastoma ([facility] 02/13/2020  . Pneumonia 2012  . Seizure ([facility] 8/16/211   last one 02/02/2020  . Stroke ([facility]    Left leg weakness   Past Surgical History:  Past Surgical History:  Procedure Laterality Date  . APPLICATION OF CRANIAL NAVIGATION Right 02/13/2020   Procedure: APPLICATION OF CRANIAL NAVIGATION;  Surgeon: [name], TTheodoro Doing DO;  [place]  Service: Neurosurgery;  Laterality: Right;  APPLICATION OF CRANIAL NAVIGATION  . BUBBLE  STUDY  01/17/2020   Procedure: BUBBLE STUDY;  Surgeon: [name], MD;  Location: [place];  Service: Cardiovascular;;  . FRAMELESS  BIOPSY WITH BRAINLAB Right 02/13/2020   Procedure: STEREOTACTIC BRAIN BIOPSY OF RIGHT SIDED LESION;  Surgeon: [name], DO;  Location: [place];  Service: Neurosurgery;  Laterality: Right;   STEREOTACTIC BRAIN BIOPSY OF RIGHT SIDED LESION  . LOOP RECORDER INSERTION N/A 01/17/2020   Procedure: LOOP RECORDER INSERTION;  Surgeon: [name], MD;  Location: [place] CV LAB;  Service: Cardiovascular;  Laterality: N/A;  . TEE WITHOUT CARDIOVERSION N/A 01/17/2020   Procedure: TRANSESOPHAGEAL ECHOCARDIOGRAM (TEE);  Surgeon: [name], MD;  Location: [facility] ENDOSCOPY;  Service: Cardiovascular;  Laterality: N/A;   Social History:  Social History   Socioeconomic History  . Marital status: Single    Spouse name: Not on file  . Number of children: 1  . Years of education: Not on file  . Highest education level: Not on file  Occupational History  . Not on file  Tobacco Use  . Smoking status: Former Smoker    Years: 2.00    Types: Cigars    Quit date: 2002    Years since quitting: 19.9  . Smokeless tobacco: Never Used  Vaping Use  . Vaping Use: Never used  Substance and Sexual Activity  . Alcohol use: Yes    Alcohol/week: 1.0 standard drink    Types: 1 Cans of beer per week    Comment: occasionally  . Drug use: No  . Sexual activity: Yes    Birth control/protection: None  Other Topics Concern  . Not on file  Social History Narrative  . Not on file   Social Determinants of Health   Financial Resource Strain:   . Difficulty of Paying Living Expenses: Not on file  Food Insecurity:   . Worried About Charity fundraiser in the Last Year: Not on file  . Ran Out of Food in the Last Year: Not on file  Transportation Needs:   . Lack of Transportation (Medical): Not on file  . Lack of Transportation (Non-Medical): Not on file  Physical Activity:   . Days of Exercise per Week: Not on file  . Minutes of Exercise per Session: Not on file  Stress:   . Feeling of Stress : Not on file  Social Connections:   . Frequency of Communication with Friends and Family: Not on file  . Frequency of Social Gatherings with Friends and Family: Not on file  . Attends  Religious Services: Not on file  . Active Member of Clubs or Organizations: Not on file  . Attends Archivist Meetings: Not on file  . Marital Status: Not on file  Intimate Partner Violence:   . Fear of Current or Ex-Partner: Not on file  . Emotionally Abused: Not on file  . Physically Abused: Not on file  . Sexually Abused: Not on file   Family History:  Family History  Problem Relation Age of Onset  . Hypertension Mother   . Lung cancer Father   . Hyperlipidemia Sister   . Hyperlipidemia Brother     Review of Systems: Constitutional: Doesn't report fevers, chills or abnormal weight loss Eyes: Doesn't report blurriness of vision Ears, nose, mouth, throat, and face: Doesn't report sore throat Respiratory: Doesn't report cough, dyspnea or wheezes Cardiovascular: Doesn't report palpitation, chest discomfort  Gastrointestinal:  Doesn't report nausea, constipation, diarrhea GU: Doesn't report incontinence Skin: Doesn't report skin  rashes Neurological: Per HPI Musculoskeletal: Doesn't report joint pain Behavioral/Psych: Doesn't report anxiety  Physical Exam: Vitals:   04/22/20 1248  BP: 129/67  Pulse: 81  Resp: 18  Temp: (!) 97 F (36.1 C)  SpO2: 100%   KPS: 70. General: Alert, cooperative, pleasant, in no acute distress Head: Normal EENT: No conjunctival injection or scleral icterus.  Lungs: Resp effort normal Cardiac: Regular rate Abdomen: Non-distended abdomen Skin: No rashes cyanosis or petechiae. Extremities: No clubbing or edema  Neurologic Exam: Mental Status: Awake, alert, attentive to examiner. Oriented to self and environment. Language is fluent with intact comprehension.  Cranial Nerves: Visual acuity is grossly normal. Visual fields are full. Extra-ocular movements intact. No ptosis. Face is symmetric Motor: Tone and bulk are normal. Power is 4/5 in left arm and leg, proximal>distal weakness in arm. Reflexes are symmetric, no pathologic reflexes  present.  Sensory: Intact to light touch Gait: Hemiparetic  Labs: I have reviewed the data as listed    Component Value Date/Time   NA 138 04/22/2020 1157   K 3.9 04/22/2020 1157   CL 103 04/22/2020 1157   CO2 24 04/22/2020 1157   GLUCOSE 158 (H) 04/22/2020 1157   BUN 21 (H) 04/22/2020 1157   CREATININE 1.12 04/22/2020 1157   CALCIUM 8.6 (L) 04/22/2020 1157   PROT 6.2 (L) 04/22/2020 1157   ALBUMIN 3.3 (L) 04/22/2020 1157   AST 17 04/22/2020 1157   ALT 53 (H) 04/22/2020 1157   ALKPHOS 55 04/22/2020 1157   BILITOT 0.9 04/22/2020 1157   GFRNONAA >60 04/22/2020 1157   GFRAA >60 02/14/2020 1122   Lab Results  Component Value Date   WBC 16.0 (H) 04/22/2020   NEUTROABS 12.2 (H) 04/22/2020   HGB 12.9 (L) 04/22/2020   HCT 39.5 04/22/2020   MCV 96.8 04/22/2020   PLT 201 04/22/2020     Assessment/Plan Glioblastoma of frontal lobe (HCC) [C71.1]  VYRON FRONCZAK is clinically stable today, now having completed 6 weeks of IMRT+TMZ.  No new or progressive deficits.  We ultimately recommended continuing with course of intensity modulated radiation therapy and concurrent daily Temozolomide.  Radiation will be administered Mon-Fri over 6 weeks, Temodar will be dosed at 78m/m2 to be given daily over 42 days.  We reviewed side effects of temodar, including fatigue, nausea/vomiting, constipation, and cytopenias.  Chemotherapy should be held for the following:  ANC less than 1,000  Platelets less than 100,000  LFT or creatinine greater than 2x ULN  If clinical concerns/contraindications develop  Every 2 weeks during radiation, labs will be checked accompanied by a clinical evaluation in the brain tumor clinic.  Decadron should be decreased to 469mdaily if tolerated.  We will call him in 1-2 weeks to continue titration.  We ask that MeKRISHANG READINGeturn to clinic in 1 months following post radiation brain MRI, or sooner as needed.  All questions were answered. The patient  knows to call the clinic with any problems, questions or concerns. No barriers to learning were detected.  The total time spent in the encounter was 30 minutes and more than 50% was on counseling and review of test results   ZaVentura SellersMD Medical Director of Neuro-Oncology CoLocustt WeAlbert City1/23/21 1:16 PM  CoWinfieldt WeSeminolerCold Spring Harbor[place] [phone] Interval Evaluation  Date of Service: 04/22/20 Patient Name: MeISAMU TRAMMELatient MRN: [id number] DOB: 11/1960/03/13rovider:  Ventura Sellers, MD  Identifying Statement:  John Watson is a 59 y.o. male with right frontal glioblastoma  Referring Provider: [name], [place],  [place] [address]  Oncologic History: 02/13/20: Sterotactic biopsy with Dr. Reatha Armour; path demonstrates glioblastoma  Biomarkers:  MGMT Unknown.  IDH 1/2 Wild type.  EGFR Unknown  TERT Unknown   Interval History:  John Watson present today for evaluation now having completed 4 weeks of radiation and temodar.  He feels significantly improved with regards to left sided weakness and imbalance since increasing decadron; currently dosing 74m daily as instructed.  Otherwise no significant issues with the treatment.  Sleep has improved from prior.  No further seizures following recent phone visit.  H+P (03/06/20) Patient presented to medical attention in mid-September after experiencing first ever seizures.  Events were described as mainly left sided shaking with post-seizure weakness.  CNS imaging demonstrated enhancing mass in the right frontal lobe consistent with primary brain tumor.  He underwent biopsy with Dr. DReatha Armouron 02/13/20; path was sent out and ultimately confirmed glioblastoma.  Today he complains of increasing weakness of the left arm and leg.  He is walking a little on his own, but for the most part is requiring  support to ambulate from family or walker.  His left hand he is not using as much, but he is particularly weak at the left shoulder.  This has worsened somewhat since surgery.  Otherwise no recurrence of seizure episodes, maintains compliance with Keppra.  Not dosing decadron currently.  Medications: Current Outpatient Medications on File Prior to Visit  Medication Sig Dispense Refill  . atorvastatin (LIPITOR) 80 MG tablet Take 1 tablet (80 mg total) by mouth daily. 30 tablet 2  . Baclofen 5 MG TABS Take 5 mg by mouth 3 (three) times daily as needed. 90 tablet 1  . chlorproMAZINE (THORAZINE) 25 MG tablet Take 25 mg by mouth 3 (three) times daily.    .Marland Kitchendexamethasone (DECADRON) 4 MG tablet Take 1 tablet (4 mg total) by mouth daily. 30 tablet 3  . levETIRAcetam (KEPPRA) 1000 MG tablet Take 1 tablet (1,000 mg total) by mouth 2 (two) times daily. 60 tablet 3  . ondansetron (ZOFRAN) 8 MG tablet Take 1 tablet (8 mg total) by mouth 2 (two) times daily as needed (nausea and vomiting). May take 30-60 minutes prior to Temodar administration if nausea/vomiting occurs. (Patient not taking: Reported on 03/27/2020) 30 tablet 1  . temozolomide (TEMODAR) 140 MG capsule Take 1 capsule (140 mg total) by mouth daily. May take on an empty stomach to decrease nausea & vomiting. 42 capsule 0   No current facility-administered medications on file prior to visit.    Allergies:  Allergies  Allergen Reactions  . Vicodin [Hydrocodone-Acetaminophen] Anaphylaxis   Past Medical History:  Past Medical History:  Diagnosis Date  . Glioblastoma ([facility] 02/13/2020  . Pneumonia 2012  . Seizure ([place] 8/16/211   last one 02/02/2020  . Stroke ([facility]    Left leg weakness   Past Surgical History:  Past Surgical History:  Procedure Laterality Date  . APPLICATION OF CRANIAL NAVIGATION Right 02/13/2020   Procedure: APPLICATION OF CRANIAL NAVIGATION;  Surgeon: [name], TTheodoro Doing DO;  [place]  Service: Neurosurgery;   Laterality: Right;  APPLICATION OF CRANIAL NAVIGATION  . BUBBLE STUDY  01/17/2020   Procedure: BUBBLE STUDY;  Surgeon: SDonato Heinz MD;  [place]  Service: Cardiovascular;;  . FRAMELESS  BIOPSY WITH BRAINLAB Right 02/13/2020   Procedure: STEREOTACTIC BRAIN BIOPSY OF RIGHT SIDED LESION;  Surgeon: [name], [name], DO;  Location: [place];  Service: Neurosurgery;  Laterality: Right;  STEREOTACTIC BRAIN BIOPSY OF RIGHT SIDED LESION  . LOOP RECORDER INSERTION N/A 01/17/2020   Procedure: LOOP RECORDER INSERTION;  Surgeon: [name], MD;  Location: [place] CV LAB;  Service: Cardiovascular;  Laterality: N/A;  . TEE WITHOUT CARDIOVERSION N/A 01/17/2020   Procedure: TRANSESOPHAGEAL ECHOCARDIOGRAM (TEE);  Surgeon: [name], MD;  Location: [facility] ENDOSCOPY;  Service: Cardiovascular;  Laterality: N/A;   Social History:  Social History   Socioeconomic History  . Marital status: Single    Spouse name: Not on file  . Number of children: 1  . Years of education: Not on file  . Highest education level: Not on file  Occupational History  . Not on file  Tobacco Use  . Smoking status: Former Smoker    Years: 2.00    Types: Cigars    Quit date: 2002    Years since quitting: 19.9  . Smokeless tobacco: Never Used  Vaping Use  . Vaping Use: Never used  Substance and Sexual Activity  . Alcohol use: Yes    Alcohol/week: 1.0 standard drink    Types: 1 Cans of beer per week    Comment: occasionally  . Drug use: No  . Sexual activity: Yes    Birth control/protection: None  Other Topics Concern  . Not on file  Social History Narrative  . Not on file   Social Determinants of Health   Financial Resource Strain:   . Difficulty of Paying Living Expenses: Not on file  Food Insecurity:   . Worried About Charity fundraiser in the Last Year: Not on file  . Ran Out of Food in the Last Year: Not on file  Transportation Needs:   . Lack of Transportation (Medical):  Not on file  . Lack of Transportation (Non-Medical): Not on file  Physical Activity:   . Days of Exercise per Week: Not on file  . Minutes of Exercise per Session: Not on file  Stress:   . Feeling of Stress : Not on file  Social Connections:   . Frequency of Communication with Friends and Family: Not on file  . Frequency of Social Gatherings with Friends and Family: Not on file  . Attends Religious Services: Not on file  . Active Member of Clubs or Organizations: Not on file  . Attends Archivist Meetings: Not on file  . Marital Status: Not on file  Intimate Partner Violence:   . Fear of Current or Ex-Partner: Not on file  . Emotionally Abused: Not on file  . Physically Abused: Not on file  . Sexually Abused: Not on file   Family History:  Family History  Problem Relation Age of Onset  . Hypertension Mother   . Lung cancer Father   . Hyperlipidemia Sister   . Hyperlipidemia Brother     Review of Systems: Constitutional: Doesn't report fevers, chills or abnormal weight loss Eyes: Doesn't report blurriness of vision Ears, nose, mouth, throat, and face: Doesn't report sore throat Respiratory: Doesn't report cough, dyspnea or wheezes Cardiovascular: Doesn't report palpitation, chest discomfort  Gastrointestinal:  Doesn't report nausea, constipation, diarrhea GU: Doesn't report incontinence Skin: Doesn't report skin rashes Neurological: Per HPI Musculoskeletal: Doesn't report joint pain Behavioral/Psych: Doesn't report anxiety  Physical Exam: There were no vitals filed for this visit. KPS: 70.  General: Alert, cooperative, pleasant, in no acute distress Head: Normal EENT: No conjunctival injection or scleral icterus.  Lungs: Resp effort normal Cardiac: Regular rate Abdomen: Non-distended abdomen Skin: No rashes cyanosis or petechiae. Extremities: No clubbing or edema  Neurologic Exam: Mental Status: Awake, alert, attentive to examiner. Oriented to self and  environment. Language is fluent with intact comprehension.  Cranial Nerves: Visual acuity is grossly normal. Visual fields are full. Extra-ocular movements intact. No ptosis. Face is symmetric Motor: Tone and bulk are normal. Power is 4/5 in left arm and leg, proximal>distal weakness in arm. Reflexes are symmetric, no pathologic reflexes present.  Sensory: Intact to light touch Gait: Hemiparetic  Labs: I have reviewed the data as listed    Component Value Date/Time   NA 142 04/10/2020 1141   K 3.5 04/10/2020 1141   CL 106 04/10/2020 1141   CO2 29 04/10/2020 1141   GLUCOSE 96 04/10/2020 1141   BUN 28 (H) 04/10/2020 1141   CREATININE 1.22 04/10/2020 1141   CALCIUM 9.1 04/10/2020 1141   PROT 6.7 04/10/2020 1141   ALBUMIN 3.5 04/10/2020 1141   AST 13 (L) 04/10/2020 1141   ALT 69 (H) 04/10/2020 1141   ALKPHOS 59 04/10/2020 1141   BILITOT 0.9 04/10/2020 1141   GFRNONAA >60 04/10/2020 1141   GFRAA >60 02/14/2020 1122   Lab Results  Component Value Date   WBC 16.0 (H) 04/22/2020   NEUTROABS 12.2 (H) 04/22/2020   HGB 12.9 (L) 04/22/2020   HCT 39.5 04/22/2020   MCV 96.8 04/22/2020   PLT 201 04/22/2020     Assessment/Plan Glioblastoma of frontal lobe (HCC) [C71.1]  John Watson is clinically stable today, now having completed 4 weeks of IMRT+TMZ.  He has responded well to increased corticosteroid therapy.  We ultimately recommended continuing with course of intensity modulated radiation therapy and concurrent daily Temozolomide.  Radiation will be administered Mon-Fri over 6 weeks, Temodar will be dosed at 40m/m2 to be given daily over 42 days.  We reviewed side effects of temodar, including fatigue, nausea/vomiting, constipation, and cytopenias.  Chemotherapy should be held for the following:  ANC less than 1,000  Platelets less than 100,000  LFT or creatinine greater than 2x ULN  If clinical concerns/contraindications develop  Every 2 weeks during radiation, labs  will be checked accompanied by a clinical evaluation in the brain tumor clinic.  We recommended continuing decadron at 65mdaily.  He will return to clinic in 2 weeks with labs for evaluation.  All questions were answered. The patient knows to call the clinic with any problems, questions or concerns. No barriers to learning were detected.  The total time spent in the encounter was 30 minutes and more than 50% was on counseling and review of test results   ZaVentura SellersMD Medical Director of Neuro-Oncology CoUniversity Hospitalt WeClintondale1/23/21 12:41 PM

## 2020-04-23 ENCOUNTER — Ambulatory Visit (HOSPITAL_COMMUNITY): Payer: Self-pay | Admitting: Physical Therapy

## 2020-04-23 ENCOUNTER — Other Ambulatory Visit: Payer: Self-pay

## 2020-04-23 ENCOUNTER — Ambulatory Visit
Admission: RE | Admit: 2020-04-23 | Discharge: 2020-04-23 | Disposition: A | Discharge status: Home / Self Care | Payer: Self-pay | Source: Ambulatory Visit | Attending: Radiation Oncology | Admitting: Radiation Oncology

## 2020-04-23 ENCOUNTER — Ambulatory Visit: Payer: Self-pay

## 2020-04-23 ENCOUNTER — Encounter (HOSPITAL_COMMUNITY): Payer: Self-pay | Admitting: Occupational Therapy

## 2020-04-27 LAB — CUP PACEART REMOTE DEVICE CHECK
Date Time Interrogation Session: 20211127225725
Implantable Pulse Generator Implant Date: 20210819

## 2020-04-28 ENCOUNTER — Ambulatory Visit: Payer: Self-pay

## 2020-04-28 ENCOUNTER — Other Ambulatory Visit: Payer: Self-pay

## 2020-04-28 ENCOUNTER — Ambulatory Visit
Admission: RE | Admit: 2020-04-28 | Discharge: 2020-04-28 | Disposition: A | Discharge status: Home / Self Care | Payer: Self-pay | Source: Ambulatory Visit | Attending: Radiation Oncology | Admitting: Radiation Oncology

## 2020-04-28 ENCOUNTER — Ambulatory Visit (INDEPENDENT_AMBULATORY_CARE_PROVIDER_SITE_OTHER): Payer: Self-pay

## 2020-04-28 ENCOUNTER — Encounter: Payer: Self-pay | Admitting: Radiation Oncology

## 2020-04-28 DIAGNOSIS — I639 Cerebral infarction, unspecified: Secondary | ICD-10-CM

## 2020-04-28 NOTE — Patient Outreach (Signed)
[facility]) Care Management  04/28/2020  FREELAND PRACHT Jun 07, 1960 [id number]   3 outreach attempts were completed to obtain mRs. mRs could not be obtained because patient never returned my calls. mRs=7    [place] Management Assistant [phone]

## 2020-04-29 ENCOUNTER — Ambulatory Visit: Payer: Self-pay

## 2020-04-29 ENCOUNTER — Encounter (HOSPITAL_COMMUNITY): Payer: Self-pay | Admitting: Occupational Therapy

## 2020-05-01 ENCOUNTER — Encounter (HOSPITAL_COMMUNITY): Payer: Self-pay | Admitting: Occupational Therapy

## 2020-05-02 ENCOUNTER — Telehealth: Payer: Self-pay | Admitting: Internal Medicine

## 2020-05-02 NOTE — Progress Notes (Signed)
Carelink Summary Report / Loop Recorder 

## 2020-05-02 NOTE — Telephone Encounter (Signed)
Scheduled per 11/24 los, patient has been called and voicemail was left.

## 2020-05-06 ENCOUNTER — Encounter (HOSPITAL_COMMUNITY): Payer: Self-pay | Admitting: Occupational Therapy

## 2020-05-08 ENCOUNTER — Other Ambulatory Visit: Payer: Self-pay | Admitting: Internal Medicine

## 2020-05-08 ENCOUNTER — Inpatient Hospital Stay: Payer: Self-pay | Attending: Neurological Surgery | Admitting: Internal Medicine

## 2020-05-08 ENCOUNTER — Encounter (HOSPITAL_COMMUNITY): Payer: Self-pay | Admitting: Occupational Therapy

## 2020-05-08 DIAGNOSIS — C711 Malignant neoplasm of frontal lobe: Secondary | ICD-10-CM | POA: Insufficient documentation

## 2020-05-08 DIAGNOSIS — Z79899 Other long term (current) drug therapy: Secondary | ICD-10-CM | POA: Insufficient documentation

## 2020-05-08 MED ORDER — CIPROFLOXACIN HCL 250 MG PO TABS: 250.0000 mg | ORAL_TABLET | Freq: Two times a day (BID) | ORAL | 0 refills | Status: DC

## 2020-05-08 MED ORDER — DEXAMETHASONE 2 MG PO TABS: 2.0000 mg | ORAL_TABLET | Freq: Every day | ORAL | 1 refills | Status: DC

## 2020-05-08 NOTE — Progress Notes (Signed)
I connected with John Watson on 05/08/20 at 11:30 AM EST by telephone visit and verified that I am speaking with the correct person using two identifiers.  I discussed the limitations, risks, security and privacy concerns of performing an evaluation and management service by telemedicine and the availability of in-person appointments. I also discussed with the patient that there may be a patient responsible charge related to this service. The patient expressed understanding and agreed to proceed.  Other persons participating in the visit and their role in the encounter:  n/a  Patient's location:  Home  Provider's location:  Office  Chief Complaint:  Glioblastoma of frontal lobe ([place])  History of Present Ilness: John Watson describes waking up this morning with heaviness and numbness of the left leg, also likely accompanied by some weakness.  He went to bed last night completely normal/baseline.  He also describes dark color and foul smell to urine the past few days, without frank burning.  He had abruptly discontinued decadron 4mg  daily 5 days prior.   Observations: Language and cognition at baseline Assessment and Plan: Glioblastoma of frontal lobe (HCC)  Suspect he experienced a breakthrough seizure, possibly provoked by urethritis (based on history) and withdrawal of dexamethasone.  Will recommend rx with ciprofloxacin 250mg  BID x5 days, and restarting decadron at lower dose 2mg  daily.    Follow Up Instructions:  Will let us know if symptoms don't improve, otherwise will follow up as scheduled in early January after next MRI study.  I discussed the assessment and treatment plan with the patient.  The patient was provided an opportunity to ask questions and all were answered.  The patient agreed with the plan and demonstrated understanding of the instructions.    The patient was advised to call back or seek an in-person evaluation if the symptoms worsen or if the condition fails to  improve as anticipated.  I provided 5-10 minutes of non-face-to-face time during this enocunter.  Ventura Sellers, MD   I provided 15 minutes of non face-to-face telephone visit time during this encounter, and > 50% was spent counseling as documented under my assessment & plan.

## 2020-05-15 ENCOUNTER — Encounter: Payer: Self-pay | Admitting: Internal Medicine

## 2020-05-15 ENCOUNTER — Other Ambulatory Visit: Payer: Self-pay | Admitting: *Deleted

## 2020-05-15 DIAGNOSIS — C711 Malignant neoplasm of frontal lobe: Secondary | ICD-10-CM

## 2020-05-16 ENCOUNTER — Inpatient Hospital Stay: Payer: Self-pay

## 2020-05-16 ENCOUNTER — Other Ambulatory Visit: Payer: Self-pay

## 2020-05-16 ENCOUNTER — Encounter (HOSPITAL_COMMUNITY): Payer: Self-pay | Admitting: Pharmacy Technician

## 2020-05-16 ENCOUNTER — Emergency Department (HOSPITAL_COMMUNITY): Payer: Self-pay

## 2020-05-16 ENCOUNTER — Inpatient Hospital Stay (HOSPITAL_COMMUNITY)
Admission: EM | Admit: 2020-05-16 | Discharge: 2020-05-20 | DRG: 442 | Disposition: A | Discharge status: Home / Self Care | Payer: Self-pay | Attending: Family Medicine | Admitting: Family Medicine

## 2020-05-16 ENCOUNTER — Telehealth: Payer: Self-pay

## 2020-05-16 DIAGNOSIS — E785 Hyperlipidemia, unspecified: Secondary | ICD-10-CM | POA: Diagnosis present

## 2020-05-16 DIAGNOSIS — Z8673 Personal history of transient ischemic attack (TIA), and cerebral infarction without residual deficits: Secondary | ICD-10-CM

## 2020-05-16 DIAGNOSIS — Z923 Personal history of irradiation: Secondary | ICD-10-CM

## 2020-05-16 DIAGNOSIS — K760 Fatty (change of) liver, not elsewhere classified: Secondary | ICD-10-CM | POA: Diagnosis present

## 2020-05-16 DIAGNOSIS — Z79899 Other long term (current) drug therapy: Secondary | ICD-10-CM

## 2020-05-16 DIAGNOSIS — C711 Malignant neoplasm of frontal lobe: Secondary | ICD-10-CM

## 2020-05-16 DIAGNOSIS — Z87891 Personal history of nicotine dependence: Secondary | ICD-10-CM

## 2020-05-16 DIAGNOSIS — E871 Hypo-osmolality and hyponatremia: Secondary | ICD-10-CM | POA: Diagnosis present

## 2020-05-16 DIAGNOSIS — G40909 Epilepsy, unspecified, not intractable, without status epilepticus: Secondary | ICD-10-CM | POA: Diagnosis present

## 2020-05-16 DIAGNOSIS — T451X5A Adverse effect of antineoplastic and immunosuppressive drugs, initial encounter: Secondary | ICD-10-CM | POA: Diagnosis present

## 2020-05-16 DIAGNOSIS — R066 Hiccough: Secondary | ICD-10-CM | POA: Diagnosis present

## 2020-05-16 DIAGNOSIS — Y929 Unspecified place or not applicable: Secondary | ICD-10-CM

## 2020-05-16 DIAGNOSIS — R7401 Elevation of levels of liver transaminase levels: Secondary | ICD-10-CM

## 2020-05-16 DIAGNOSIS — E875 Hyperkalemia: Secondary | ICD-10-CM | POA: Diagnosis present

## 2020-05-16 DIAGNOSIS — Z20822 Contact with and (suspected) exposure to covid-19: Secondary | ICD-10-CM | POA: Diagnosis present

## 2020-05-16 DIAGNOSIS — Z801 Family history of malignant neoplasm of trachea, bronchus and lung: Secondary | ICD-10-CM

## 2020-05-16 DIAGNOSIS — R17 Unspecified jaundice: Principal | ICD-10-CM

## 2020-05-16 DIAGNOSIS — R7989 Other specified abnormal findings of blood chemistry: Secondary | ICD-10-CM | POA: Diagnosis present

## 2020-05-16 DIAGNOSIS — Z885 Allergy status to narcotic agent status: Secondary | ICD-10-CM

## 2020-05-16 DIAGNOSIS — E861 Hypovolemia: Secondary | ICD-10-CM | POA: Diagnosis present

## 2020-05-16 DIAGNOSIS — Z8249 Family history of ischemic heart disease and other diseases of the circulatory system: Secondary | ICD-10-CM

## 2020-05-16 DIAGNOSIS — R748 Abnormal levels of other serum enzymes: Secondary | ICD-10-CM | POA: Diagnosis present

## 2020-05-16 DIAGNOSIS — E876 Hypokalemia: Secondary | ICD-10-CM | POA: Diagnosis present

## 2020-05-16 DIAGNOSIS — Z83438 Family history of other disorder of lipoprotein metabolism and other lipidemia: Secondary | ICD-10-CM

## 2020-05-16 LAB — URINALYSIS, COMPLETE (UACMP) WITH MICROSCOPIC
Bacteria, UA: NONE SEEN
Glucose, UA: NEGATIVE mg/dL
Hgb urine dipstick: NEGATIVE
Ketones, ur: NEGATIVE mg/dL
Leukocytes,Ua: NEGATIVE
Nitrite: NEGATIVE
Protein, ur: 30 mg/dL — AB
Specific Gravity, Urine: 1.02 (ref 1.005–1.030)
pH: 5 (ref 5.0–8.0)

## 2020-05-16 LAB — CMP (CANCER CENTER ONLY)
ALT: 2257 U/L (ref 0–44)
AST: 564 U/L (ref 15–41)
Albumin: 2.6 g/dL — ABNORMAL LOW (ref 3.5–5.0)
Alkaline Phosphatase: 586 U/L — ABNORMAL HIGH (ref 38–126)
Anion gap: 9 (ref 5–15)
BUN: 15 mg/dL (ref 6–20)
CO2: 23 mmol/L (ref 22–32)
Calcium: 9.3 mg/dL (ref 8.9–10.3)
Chloride: 101 mmol/L (ref 98–111)
Creatinine: 1.16 mg/dL (ref 0.61–1.24)
GFR, Estimated: >60 mL/min (ref >60)
Glucose, Bld: 124 mg/dL — ABNORMAL HIGH (ref 70–99)
Potassium: 3.4 mmol/L — ABNORMAL LOW (ref 3.5–5.1)
Sodium: 133 mmol/L — ABNORMAL LOW (ref 135–145)
Total Bilirubin: 23.6 mg/dL (ref 0.3–1.2)
Total Protein: 6.5 g/dL (ref 6.5–8.1)

## 2020-05-16 LAB — COMPREHENSIVE METABOLIC PANEL
ALT: 1826 U/L — ABNORMAL HIGH (ref 0–44)
AST: 488 U/L — ABNORMAL HIGH (ref 15–41)
Albumin: 2.6 g/dL — ABNORMAL LOW (ref 3.5–5.0)
Alkaline Phosphatase: 432 U/L — ABNORMAL HIGH (ref 38–126)
Anion gap: 17 — ABNORMAL HIGH (ref 5–15)
BUN: 12 mg/dL (ref 6–20)
CO2: 19 mmol/L — ABNORMAL LOW (ref 22–32)
Calcium: 8.9 mg/dL (ref 8.9–10.3)
Chloride: 98 mmol/L (ref 98–111)
Creatinine, Ser: 0.91 mg/dL (ref 0.61–1.24)
GFR, Estimated: >60 mL/min (ref >60)
Glucose, Bld: 135 mg/dL — ABNORMAL HIGH (ref 70–99)
Potassium: 3.3 mmol/L — ABNORMAL LOW (ref 3.5–5.1)
Sodium: 134 mmol/L — ABNORMAL LOW (ref 135–145)
Total Bilirubin: 20.5 mg/dL (ref 0.3–1.2)
Total Protein: 5.8 g/dL — ABNORMAL LOW (ref 6.5–8.1)

## 2020-05-16 LAB — CBC WITH DIFFERENTIAL (CANCER CENTER ONLY)
Abs Immature Granulocytes: 1.75 10*3/uL — ABNORMAL HIGH (ref 0.00–0.07)
Basophils Absolute: 0.1 10*3/uL (ref 0.0–0.1)
Basophils Relative: 0 %
Eosinophils Absolute: 0.2 10*3/uL (ref 0.0–0.5)
Eosinophils Relative: 2 %
HCT: 32.5 % — ABNORMAL LOW (ref 39.0–52.0)
Hemoglobin: 11.5 g/dL — ABNORMAL LOW (ref 13.0–17.0)
Immature Granulocytes: 13 %
Lymphocytes Relative: 7 %
Lymphs Abs: 0.9 10*3/uL (ref 0.7–4.0)
MCH: 31.6 pg (ref 26.0–34.0)
MCHC: 35.4 g/dL (ref 30.0–36.0)
MCV: 89.3 fL (ref 80.0–100.0)
Monocytes Absolute: 1.5 10*3/uL — ABNORMAL HIGH (ref 0.1–1.0)
Monocytes Relative: 12 %
Neutro Abs: 8.9 10*3/uL — ABNORMAL HIGH (ref 1.7–7.7)
Neutrophils Relative %: 66 %
Platelet Count: 285 10*3/uL (ref 150–400)
RBC: 3.64 MIL/uL — ABNORMAL LOW (ref 4.22–5.81)
RDW: 22.4 % — ABNORMAL HIGH (ref 11.5–15.5)
WBC Count: 13.4 10*3/uL — ABNORMAL HIGH (ref 4.0–10.5)
nRBC: 0.4 % — ABNORMAL HIGH (ref 0.0–0.2)

## 2020-05-16 LAB — BILIRUBIN, DIRECT: Bilirubin, Direct: 13.8 mg/dL — ABNORMAL HIGH (ref 0.0–0.2)

## 2020-05-16 LAB — PROTIME-INR
INR: 1.2 (ref 0.8–1.2)
Prothrombin Time: 14.4 seconds (ref 11.4–15.2)

## 2020-05-16 LAB — RESP PANEL BY RT-PCR (FLU A&B, COVID) ARPGX2
Influenza A by PCR: NEGATIVE
Influenza B by PCR: NEGATIVE
SARS Coronavirus 2 by RT PCR: NEGATIVE

## 2020-05-16 LAB — HEPATITIS PANEL, ACUTE
HCV Ab: NONREACTIVE
Hep A IgM: NONREACTIVE
Hep B C IgM: NONREACTIVE
Hepatitis B Surface Ag: NONREACTIVE

## 2020-05-16 LAB — APTT: aPTT: 27 seconds (ref 24–36)

## 2020-05-16 LAB — MAGNESIUM: Magnesium: 1.9 mg/dL (ref 1.7–2.4)

## 2020-05-16 MED ORDER — LEVETIRACETAM 500 MG PO TABS
1000.0000 mg | ORAL_TABLET | Freq: Once | ORAL | Status: AC
  Administered 2020-05-16: 1000 mg via ORAL
  Filled 2020-05-16: qty 2

## 2020-05-16 MED ORDER — ADULT MULTIVITAMIN W/MINERALS CH
1.0000 | ORAL_TABLET | Freq: Every day | ORAL | Status: DC
  Administered 2020-05-17 – 2020-05-20 (×4): 1 via ORAL
  Filled 2020-05-16 (×4): qty 1

## 2020-05-16 MED ORDER — ENOXAPARIN SODIUM 40 MG/0.4ML ~~LOC~~ SOLN
40.0000 mg | SUBCUTANEOUS | Status: DC
  Administered 2020-05-17 – 2020-05-19 (×4): 40 mg via SUBCUTANEOUS
  Filled 2020-05-16 (×4): qty 0.4

## 2020-05-16 MED ORDER — POTASSIUM CHLORIDE CRYS ER 20 MEQ PO TBCR
40.0000 meq | EXTENDED_RELEASE_TABLET | Freq: Once | ORAL | Status: AC
  Administered 2020-05-17: 40 meq via ORAL
  Filled 2020-05-16: qty 2

## 2020-05-16 MED ORDER — IOHEXOL 300 MG/ML  SOLN
100.0000 mL | Freq: Once | INTRAMUSCULAR | Status: AC | PRN
  Administered 2020-05-16: 100 mL via INTRAVENOUS

## 2020-05-16 MED ORDER — LEVETIRACETAM 500 MG PO TABS
1000.0000 mg | ORAL_TABLET | Freq: Two times a day (BID) | ORAL | Status: DC
  Administered 2020-05-17 – 2020-05-20 (×7): 1000 mg via ORAL
  Filled 2020-05-16 (×7): qty 2

## 2020-05-16 MED ORDER — DEXAMETHASONE 2 MG PO TABS
2.0000 mg | ORAL_TABLET | Freq: Every day | ORAL | Status: DC
  Administered 2020-05-17 – 2020-05-20 (×4): 2 mg via ORAL
  Filled 2020-05-16 (×4): qty 1

## 2020-05-16 MED ORDER — SODIUM CHLORIDE 0.9 % IV SOLN: INTRAVENOUS | Status: AC

## 2020-05-16 NOTE — ED Notes (Signed)
To ultrasound

## 2020-05-16 NOTE — ED Notes (Signed)
The pt returned from c-t 

## 2020-05-16 NOTE — ED Notes (Signed)
To ct

## 2020-05-16 NOTE — ED Provider Notes (Signed)
[place] EMERGENCY DEPARTMENT Provider Note   CSN: [id number] Arrival date & time: 05/16/20  1354     History Chief Complaint  Patient presents with  . Abnormal Lab    John Watson is a 59 y.o. male w past medical history of recently diagnosed glioblastoma in September of this year, with seizures, and stroke (2/t brain mass), presenting to the emergency department with elevated liver enzymes and jaundice.  Per chart review, he had labs done outpatient today at the cancer center which showed markedly elevated LFTs including T bili of 23.6.   Patient states his son noticed his skin color changed a few days ago, however his skin has been itching for a couple of weeks now. He denies any abdominal pain, nausea, diarrhea.  He still has his gallbladder.  He states many weeks ago he did have an episode of white-colored stool though that resolved.  He recently completed oral chemotherapy treatment about a week and a half ago, and radiation therapy on November 29.  He is followed by [name] with oncology.  He states he feels well today.  Has completed Covid vaccines.  The history is provided by the patient and medical records.       Past Medical History:  Diagnosis Date  . Glioblastoma ([place]) 02/13/2020  . Pneumonia 2012  . Seizure ([place]) 8/16/211   last one 02/02/2020  . Stroke [facility])    Left leg weakness    Patient Active Problem List   Diagnosis Date Noted  . Jaundice 05/16/2020  . Brain lesion 02/13/2020  . Glioblastoma of frontal lobe ([place]) 02/01/2020  . Seizures ([place]) 01/16/2020  . Hyperlipidemia LDL goal <70 01/16/2020  . Tobacco use disorder 01/16/2020  . 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 APPLICATION OF CRANIAL NAVIGATION Right 02/13/2020   Procedure: APPLICATION OF CRANIAL NAVIGATION;  Surgeon: [name], [name], DO;  Location: [place];   Service: Neurosurgery;  Laterality: Right;  APPLICATION OF CRANIAL NAVIGATION  . BUBBLE STUDY  01/17/2020   Procedure: BUBBLE STUDY;  Surgeon: [name], MD;  Location: [place];  Service: Cardiovascular;;  . FRAMELESS  BIOPSY WITH BRAINLAB Right 02/13/2020   Procedure: STEREOTACTIC BRAIN BIOPSY OF RIGHT SIDED LESION;  Surgeon: [name], DO;  Location: [place];  Service: Neurosurgery;  Laterality: Right;  STEREOTACTIC BRAIN BIOPSY OF RIGHT SIDED LESION  . LOOP RECORDER INSERTION N/A 01/17/2020   Procedure: LOOP RECORDER INSERTION;  Surgeon: [name], MD;  Location: [place] CV LAB;  Service: Cardiovascular;  Laterality: N/A;  . TEE WITHOUT CARDIOVERSION N/A 01/17/2020   Procedure: TRANSESOPHAGEAL ECHOCARDIOGRAM (TEE);  Surgeon: [name], MD;  Location: [facility] ENDOSCOPY;  Service: Cardiovascular;  Laterality: N/A;       Family History  Problem Relation Age of Onset  . Hypertension Mother   . Lung cancer Father   . Hyperlipidemia Sister   . Hyperlipidemia Brother     Social History   Tobacco Use  . Smoking status: Former Smoker    Years: 2.00    Types: Cigars    Quit date: 2002    Years since quitting: 19.9  . Smokeless tobacco: Never Used  Vaping Use  . Vaping Use: Never used  Substance Use Topics  . Alcohol use: Yes    Alcohol/week: 1.0 standard drink    Types: 1 Cans of beer per week  Comment: occasionally  . Drug use: No    Home Medications Prior to Admission medications   Medication Sig Start Date End Date Taking? Authorizing Provider  baclofen (LIORESAL) 10 MG tablet Take 1 tablet (10 mg total) by mouth 3 (three) times daily as needed for muscle spasms. Patient taking differently: Take 10 mg by mouth 3 (three) times daily as needed (for hiccups or spasms). 04/22/20  Yes Vaslow, Acey Lav, MD  chlorproMAZINE (THORAZINE) 25 MG tablet Take 25 mg by mouth 3 (three) times daily as needed for hiccoughs. 03/11/20  Yes [provider]  dexamethasone (DECADRON) 2 MG tablet Take 1 tablet (2 mg total) by mouth daily. 05/08/20  Yes Vaslow, Acey Lav, MD  GAS RELIEF EXTRA STRENGTH 125 MG chewable tablet Chew 125 mg by mouth every 6 (six) hours as needed for flatulence.   Yes [provider]  levETIRAcetam (KEPPRA) 1000 MG tablet Take 1 tablet (1,000 mg total) by mouth 2 (two) times daily. 03/27/20 04/26/20 Yes Vaslow, Acey Lav, MD  ondansetron (ZOFRAN) 8 MG tablet Take 1 tablet (8 mg total) by mouth 2 (two) times daily as needed (nausea and vomiting). May take 30-60 minutes prior to Temodar administration if nausea/vomiting occurs. Patient taking differently: Take 8 mg by mouth 2 (two) times daily as needed (nausea and vomiting). 03/07/20  Yes Vaslow, Acey Lav, MD  PHAZYME 125 MG chewable tablet Chew 125 mg by mouth every 6 (six) hours as needed for flatulence.   Yes [provider]  atorvastatin (LIPITOR) 80 MG tablet Take 1 tablet (80 mg total) by mouth daily. 01/17/20   Donzetta Starch, NP  levofloxacin (LEVAQUIN) 250 MG tablet TAKE 1 TABLET BY MOUTH EVERY DAY Patient not taking: No sig reported 05/08/20   Ventura Sellers, MD  temozolomide (TEMODAR) 140 MG capsule Take 1 capsule (140 mg total) by mouth daily. May take on an empty stomach to decrease nausea & vomiting. Patient not taking: No sig reported 03/07/20   Ventura Sellers, MD    Allergies    Vicodin [hydrocodone-acetaminophen]  Review of Systems   Review of Systems  Skin: Positive for color change.  All other systems reviewed and are negative.   Physical Exam Updated Vital Signs BP (!) 138/92   Pulse 78   Temp 98.6 F (37 C)   Resp (!) 21   Ht _0  (1.803 m)   Wt 77.1 kg   SpO2 98%   BMI 23.71 kg/m   Physical Exam Vitals and nursing note reviewed.  Constitutional:      General: He is not in acute distress.    Appearance: He is well-developed and well-nourished.  HENT:     Head: Normocephalic and atraumatic.  Eyes:      General: Scleral icterus present.  Cardiovascular:     Rate and Rhythm: Normal rate and regular rhythm.  Pulmonary:     Effort: Pulmonary effort is normal. No respiratory distress.     Breath sounds: Normal breath sounds.  Abdominal:     General: Bowel sounds are normal.     Palpations: Abdomen is soft.     Tenderness: There is no abdominal tenderness. There is no guarding or rebound.  Musculoskeletal:     Right lower leg: No edema.     Left lower leg: No edema.  Skin:    General: Skin is warm.  Neurological:     Mental Status: He is alert.  Psychiatric:        Mood  and Affect: Mood and affect normal.        Behavior: Behavior normal.     ED Results / Procedures / Treatments   Labs (all labs ordered are listed, but only abnormal results are displayed) Labs Reviewed  BILIRUBIN, DIRECT - Abnormal; Notable for the following components:      Result Value   Bilirubin, Direct 13.8 (*)    All other components within normal limits  RESP PANEL BY RT-PCR (FLU A&B, COVID) ARPGX2  HEPATITIS PANEL, ACUTE  PROTIME-INR  APTT  COMPREHENSIVE METABOLIC PANEL    EKG None  Radiology CT CHEST ABDOMEN PELVIS W CONTRAST  Result Date: 05/16/2020 CLINICAL DATA:  Abdominal mass, intra-abdominal neoplasm suspected hx glioblastoma, acute transaminitis with jaundice History of glioblastoma. EXAM: CT CHEST, ABDOMEN, AND PELVIS WITH CONTRAST TECHNIQUE: Multidetector CT imaging of the chest, abdomen and pelvis was performed following the standard protocol during bolus administration of intravenous contrast. CONTRAST:  113m OMNIPAQUE IOHEXOL 300 MG/ML  SOLN COMPARISON:  Abdominal ultrasound earlier today. Chest abdomen pelvis CT 01/15/2020 FINDINGS: CT CHEST FINDINGS Cardiovascular: Mild aortic atherosclerosis. No aortic aneurysm or dissection. Central pulmonary arteries are well opacified. Heart is normal in size. No pericardial effusion. Mediastinum/Nodes: No enlarged mediastinal or hilar lymph  nodes. No axillary adenopathy. No thyroid nodule. No esophageal wall thickening. Lungs/Pleura: Minor dependent atelectasis in both lung bases. Additional linear subsegmental atelectasis in the right lower lobe. No confluent consolidation. There is no pulmonary mass. Tiny calcified nodule at the right lung apex consistent with prior granulomatous disease. Stable tiny perifissural nodule in the right middle lobe, series 5, image 84. No new or progressive pulmonary nodule. Trachea and central bronchi are patent. No pleural fluid. No findings of pulmonary edema. Musculoskeletal: No acute osseous abnormality or focal bone lesion. Scattered Schmorl's nodes in the thoracic spine. CT ABDOMEN PELVIS FINDINGS Hepatobiliary: Mild hepatic steatosis. No significant change in the scattered subcentimeter hepatic hypodensities from prior exam. Largest lesion in the subcapsular right lobe measures 10 mm. No progression or new lesion. Calcified gallstones within decompressed gallbladder. No pericholecystic fat stranding. There is no intra or extrahepatic biliary ductal dilatation. No calcified choledocholithiasis. Pancreas: Motion artifact through the head and uncinate process of the pancreas. No evidence of focal or obstructing lesion. No ductal dilatation or inflammation. Spleen: Normal in size without focal abnormality. Adrenals/Urinary Tract: Normal adrenal glands without nodule. Motion artifact through the kidneys. No hydronephrosis or perinephric edema. Homogeneous renal enhancement with symmetric excretion on delayed phase imaging. Again seen 8 mm cyst in the lower left kidney. Urinary bladder is partially distended without wall thickening. Stomach/Bowel: Evaluation of bowel loops in the mid abdomen is limited given patient motion. Equivocal Peri pyloric gastric wall thickening, series 3, image 57. No proximal gastric obstruction or abnormal distention. No small bowel distension. No obvious bowel inflammation or evidence of  bowel mass. Appendix not confidently visualized. Vascular/Lymphatic: Aortic atherosclerosis. No aortic aneurysm. Patent portal vein. Assessment for adenopathy is limited given motion. Allowing for this, no bulky abdominopelvic lymph nodes. Reproductive: Enlarged prostate gland spanning 5.4 cm transverse. There is mild mass effect on the bladder base. Other: No ascites or free air. No evidence of intra-abdominal mass allowing for motion. Umbilical hernia on prior exam is obscured by motion. Musculoskeletal: No acute osseous abnormalities or focal osseous lesion. IMPRESSION: 1. No evidence of malignancy in the chest, abdomen, or pelvis allowing for patient motion. 2. Gallstones in contracted gallbladder. There is no biliary obstruction or abnormal distention. No explanation for jaundice. 3.  Mild hepatic steatosis is well as scattered subcentimeter hepatic lesions stable from CT 4 months ago. 4. Equivocal peri pyloric gastric wall thickening, can be seen with gastritis. 5. Enlarged prostate gland. 6. Punctate perifissural nodule in the right middle lobe is unchanged. Aortic Atherosclerosis (ICD10-I70.0). Electronically Signed   By: Keith Rake M.D.   On: 05/16/2020 19:49   US Abdomen Limited RUQ (LIVER/GB)  Result Date: 05/16/2020 CLINICAL DATA:  Transaminitis. EXAM: ULTRASOUND ABDOMEN LIMITED RIGHT UPPER QUADRANT COMPARISON:  None. FINDINGS: Gallbladder: The gallbladder is contracted. Suggestion of a gallstone within the gallbladder lumen measuring up to 7 mm. No wall thickening visualized. No sonographic Murphy sign noted by sonographer. Common bile duct: Diameter: 3 mm Liver: No focal lesion identified. Within normal limits parenchymal echogenicity. Portal vein is patent on color Doppler imaging with normal direction of blood flow towards the liver. Other: Pancreas not visualized. IMPRESSION: 1. Limited evaluation due to contracted gallbladder and bowel gas. Pancreas not visualized. 2. Suggestion of  cholelithiasis. Electronically Signed   By: Iven Finn M.D.   On: 05/16/2020 17:04    Procedures Procedures (including critical care time)  Medications Ordered in ED Medications  levETIRAcetam (KEPPRA) tablet 1,000 mg (has no administration in time range)  iohexol (OMNIPAQUE) 300 MG/ML solution 100 mL (100 mLs Intravenous Contrast Given 05/16/20 1858)    ED Course  I have reviewed the triage vital signs and the nursing notes.  Pertinent labs & imaging results that were available during my care of the patient were reviewed by me and considered in my medical decision making (see chart for details).  Clinical Course as of 05/16/20 2140  Fri May 16, 2020  2125 Patient admitted to hospitalist service. Request repeat CMP. Will order. [JR]    Clinical Course User Index [JR] Zaylee Cornia, [place] N, PA-C   MDM Rules/Calculators/A&P                          Patient with recent diagnosis of glioblastoma the summer, followed by [name], presenting with acute transaminitis and elevated bilirubin.  Patient had outpatient labs done today at oncology with  elevated LFTs and bilirubin.  AST of 564, ALT of 2257, alk phos of 586 and T bili of 23.6.  These are acutely elevated from prior CMP 3 weeks ago.  Patient denies any new nausea, vomiting, diarrhea, constipation, abdominal pain.  No recent alcohol use or frequent Tylenol use.  He states he has noticed he has been itchy over the last couple of weeks, his son noticed jaundice a few days ago.  Patient states he completed oral chemotherapy about a week and a half ago.  Discussed with [name], states his oral chemotherapy medication does have potential to cause some mild transaminitis though should not affect his alk phos or bilirubin.  On examination, patient is in no acute distress though does have obvious jaundice and scleral icterus.  His abdomen is benign.  His vital signs are stable.  Hepatitis panel, coags, direct bilirubin are ordered.   Right upper quadrant ultrasound is also obtained to evaluate for possible obstructing mass.  Right upper quadrant ultrasound shows contracted gallbladder, proximal common bile duct is normal in appearance, likely gallstones, no inflammatory findings or obvious masses on ultrasound.  It was unable to visualize the pancreatic head therefore CT was obtained to evaluate for any other obstructing masses that were not seen on ultrasound.  This too is negative.  Hepatitis panel is nonreactive.  Coags are unremarkable.  Direct bili is 13.8.  Patient will be admitted to hospital service for further evaluation and management of acute transaminitis and hyperbilirubinemia.  Dr. [place] Crane accepting.  Requesting repeat metabolic panel.  Final Clinical Impression(s) / ED Diagnoses Final diagnoses:  Transaminitis  Jaundice    Rx / DC Orders ED Discharge Orders    None       Shaleka Brines, [place] N, PA-C 05/16/20 2148    Quintella Reichert, MD 05/19/20 1132

## 2020-05-16 NOTE — ED Notes (Signed)
The pt was diagnosed with a brain tumor in august.  He has had radiation and po chemotherapy for the past 3 days he has had jauncdiced skin and yellow sclera  No pain  He has had some respiratory spasms since September  A and o x 4

## 2020-05-16 NOTE — ED Notes (Signed)
The pa saw the pt before myself

## 2020-05-16 NOTE — Telephone Encounter (Signed)
CRITICAL VALUE STICKER  CRITICAL VALUE: Total Bili = 23.6, AST = 564 and ALT = 2,257  RECEIVER (on-site recipient of call): Yetta Glassman, CMA  DATE & TIME NOTIFIED: 05/16/20 at 12:25pm  MESSENGER (representative from lab): Suanne Marker  MD NOTIFIED: [name]  TIME OF NOTIFICATION: 05/16/20 at 12:27pm  RESPONSE: Notification given to Eustaquio Maize, RN for follow-up with provider.

## 2020-05-16 NOTE — H&P (Signed)
History and Physical  John Watson MVH:[id number] DOB: 1961/03/22 DOA: 05/16/2020  Referring physician: Beryle Watson, [place] PCP: Pcp, No  Outpatient Specialists: Oncology. Patient coming from: Presented as a referral from his oncologist [name], from home.  Chief Complaint: Jaundice.  HPI: John Watson is a 59 y.o. male with medical history significant for recently diagnosed glioblastoma of frontal lobe (02/13/20), completed 6 weeks of radiation and oral antineoplastic angent, seizure disorder, prior CVA, hyperlipidemia who presented to [facility] ED as requested by his oncologist [name] due to abnormal labs.  He was found to have markedly elevated LFTs and T bilirubin greater than 23.  Jaundice on exam with icteric sclerae.  Advised to go to the ED for further evaluation and management.  CT abdomen and pelvis with contrast and abdominal ultrasound remarkable for calcified gallstones within decompressed gallbladder, no intra or extrahepatic biliary ductal dilatation, no calcified choledocholithiasis, mild hepatic steatosis, scattered subcentimeter hepatic lesions stable from CT scan done 4 months ago.  Acute hepatitis panel negative.  Per oncology, po chemotherapy medication which he completed on 04/25/20 is less likely to have caused markedly elevated LFTs and T bili.  He endorses abdominal distention with no pain or nausea.  Was told by his son 3 days ago that his eyes look jaundice.  He has been fatigue which is chronic since he was diagnosed with glioblastoma.  TRH, hospitalist team, was asked to admit.  ED Course:  Afebrile, BP 138/92, heart rate 78, respiration rate 23, O2 saturation 99% on room air.  Lab studies remarkable for serum potassium 3.4, serum sodium 133, serum glucose 124, creatinine 1.16, alkaline phosphatase 586, AST 564, ALT 2257, T bilirubin 23, direct bilirubin 13.  WBC 13.4K, hemoglobin 11.5, neutrophil count 8.9, INR 1.2.  Acute hepatitis panel negative on  05/16/2020.  Review of Systems: Review of systems as noted in the HPI. All other systems reviewed and are negative.   Past Medical History:  Diagnosis Date  . Glioblastoma ([place]) 02/13/2020  . Pneumonia 2012  . Seizure ([place]) 8/16/211   last one 02/02/2020  . Stroke [facility])    Left leg weakness   Past Surgical History:  Procedure Laterality Date  . APPLICATION OF CRANIAL NAVIGATION Right 02/13/2020   Procedure: APPLICATION OF CRANIAL NAVIGATION;  Surgeon: [name], [name], DO;  Location: [place];  Service: Neurosurgery;  Laterality: Right;  APPLICATION OF CRANIAL NAVIGATION  . BUBBLE STUDY  01/17/2020   Procedure: BUBBLE STUDY;  Surgeon: [name], MD;  Location: [place];  Service: Cardiovascular;;  . FRAMELESS  BIOPSY WITH BRAINLAB Right 02/13/2020   Procedure: STEREOTACTIC BRAIN BIOPSY OF RIGHT SIDED LESION;  Surgeon: [name], DO;  Location: [place];  Service: Neurosurgery;  Laterality: Right;  STEREOTACTIC BRAIN BIOPSY OF RIGHT SIDED LESION  . LOOP RECORDER INSERTION N/A 01/17/2020   Procedure: LOOP RECORDER INSERTION;  Surgeon: [name], MD;  Location: [place] CV LAB;  Service: Cardiovascular;  Laterality: N/A;  . TEE WITHOUT CARDIOVERSION N/A 01/17/2020   Procedure: TRANSESOPHAGEAL ECHOCARDIOGRAM (TEE);  Surgeon: [name], MD;  Location: [facility] ENDOSCOPY;  Service: Cardiovascular;  Laterality: N/A;    Social History:  reports that he quit smoking about 19 years ago. His smoking use included cigars. He quit after 2.00 years of use. He has never used smokeless tobacco. He reports current alcohol use of about 1.0 standard drink of alcohol per week. He reports that he does not use drugs.   Allergies  Allergen Reactions  .  Vicodin [Hydrocodone-Acetaminophen] Anaphylaxis    Family History  Problem Relation Age of Onset  . Hypertension Mother   . Lung cancer Father   . Hyperlipidemia Sister   . Hyperlipidemia Brother       Prior to  Admission medications   Medication Sig Start Date End Date Taking? Authorizing Provider  baclofen (LIORESAL) 10 MG tablet Take 1 tablet (10 mg total) by mouth 3 (three) times daily as needed for muscle spasms. Patient taking differently: Take 10 mg by mouth 3 (three) times daily as needed (for hiccups or spasms). 04/22/20  Yes Vaslow, Acey Lav, MD  chlorproMAZINE (THORAZINE) 25 MG tablet Take 25 mg by mouth 3 (three) times daily as needed for hiccoughs. 03/11/20  Yes [provider]  dexamethasone (DECADRON) 2 MG tablet Take 1 tablet (2 mg total) by mouth daily. 05/08/20  Yes Vaslow, Acey Lav, MD  GAS RELIEF EXTRA STRENGTH 125 MG chewable tablet Chew 125 mg by mouth every 6 (six) hours as needed for flatulence.   Yes [provider]  levETIRAcetam (KEPPRA) 1000 MG tablet Take 1 tablet (1,000 mg total) by mouth 2 (two) times daily. 03/27/20 04/26/20 Yes Vaslow, Acey Lav, MD  ondansetron (ZOFRAN) 8 MG tablet Take 1 tablet (8 mg total) by mouth 2 (two) times daily as needed (nausea and vomiting). May take 30-60 minutes prior to Temodar administration if nausea/vomiting occurs. Patient taking differently: Take 8 mg by mouth 2 (two) times daily as needed (nausea and vomiting). 03/07/20  Yes Vaslow, Acey Lav, MD  PHAZYME 125 MG chewable tablet Chew 125 mg by mouth every 6 (six) hours as needed for flatulence.   Yes [provider]  atorvastatin (LIPITOR) 80 MG tablet Take 1 tablet (80 mg total) by mouth daily. 01/17/20   Donzetta Starch, NP  levofloxacin (LEVAQUIN) 250 MG tablet TAKE 1 TABLET BY MOUTH EVERY DAY Patient not taking: No sig reported 05/08/20   Ventura Sellers, MD  temozolomide (TEMODAR) 140 MG capsule Take 1 capsule (140 mg total) by mouth daily. May take on an empty stomach to decrease nausea & vomiting. Patient not taking: No sig reported 03/07/20   Ventura Sellers, MD    Physical Exam: BP (!) 138/92   Pulse 78   Temp 98.6 F (37 C)   Resp (!) 21   Ht 5'  11" (1.803 m)   Wt 77.1 kg   SpO2 98%   BMI 23.71 kg/m   . General: 59 y.o. year-old male well developed well nourished in no acute distress.  Alert and oriented x3. . Cardiovascular: Regular rate and rhythm with no rubs or gallops.  No thyromegaly or JVD noted.  No lower extremity edema. 2/4 pulses in all 4 extremities. Marland Kitchen Respiratory: Clear to auscultation with no wheezes or rales. Good inspiratory effort. . Abdomen: Soft nontender nondistended with normal bowel sounds x4 quadrants. . Muskuloskeletal: No cyanosis, clubbing or edema noted bilaterally . Neuro: CN II-XII intact, strength, sensation, reflexes . Skin: No ulcerative lesions noted or rashes . Psychiatry: Judgement and insight appear normal. Mood is appropriate for condition and setting          Labs on Admission:  Basic Metabolic Panel: Recent Labs  Lab 05/16/20 1130  NA 133*  K 3.4*  CL 101  CO2 23  GLUCOSE 124*  BUN 15  CREATININE 1.16  CALCIUM 9.3   Liver Function Tests: Recent Labs  Lab 05/16/20 1130  AST 564*  ALT 2,257*  ALKPHOS 586*  BILITOT  23.6*  PROT 6.5  ALBUMIN 2.6*   No results for input(s): LIPASE, AMYLASE in the last 168 hours. No results for input(s): AMMONIA in the last 168 hours. CBC: Recent Labs  Lab 05/16/20 1130  WBC 13.4*  NEUTROABS 8.9*  HGB 11.5*  HCT 32.5*  MCV 89.3  PLT 285   Cardiac Enzymes: No results for input(s): CKTOTAL, CKMB, CKMBINDEX, TROPONINI in the last 168 hours.  BNP (last 3 results) No results for input(s): BNP in the last 8760 hours.  ProBNP (last 3 results) No results for input(s): PROBNP in the last 8760 hours.  CBG: No results for input(s): GLUCAP in the last 168 hours.  Radiological Exams on Admission: CT CHEST ABDOMEN PELVIS W CONTRAST  Result Date: 05/16/2020 CLINICAL DATA:  Abdominal mass, intra-abdominal neoplasm suspected hx glioblastoma, acute transaminitis with jaundice History of glioblastoma. EXAM: CT CHEST, ABDOMEN, AND PELVIS WITH  CONTRAST TECHNIQUE: Multidetector CT imaging of the chest, abdomen and pelvis was performed following the standard protocol during bolus administration of intravenous contrast. CONTRAST:  138mL OMNIPAQUE IOHEXOL 300 MG/ML  SOLN COMPARISON:  Abdominal ultrasound earlier today. Chest abdomen pelvis CT 01/15/2020 FINDINGS: CT CHEST FINDINGS Cardiovascular: Mild aortic atherosclerosis. No aortic aneurysm or dissection. Central pulmonary arteries are well opacified. Heart is normal in size. No pericardial effusion. Mediastinum/Nodes: No enlarged mediastinal or hilar lymph nodes. No axillary adenopathy. No thyroid nodule. No esophageal wall thickening. Lungs/Pleura: Minor dependent atelectasis in both lung bases. Additional linear subsegmental atelectasis in the right lower lobe. No confluent consolidation. There is no pulmonary mass. Tiny calcified nodule at the right lung apex consistent with prior granulomatous disease. Stable tiny perifissural nodule in the right middle lobe, series 5, image 84. No new or progressive pulmonary nodule. Trachea and central bronchi are patent. No pleural fluid. No findings of pulmonary edema. Musculoskeletal: No acute osseous abnormality or focal bone lesion. Scattered Schmorl's nodes in the thoracic spine. CT ABDOMEN PELVIS FINDINGS Hepatobiliary: Mild hepatic steatosis. No significant change in the scattered subcentimeter hepatic hypodensities from prior exam. Largest lesion in the subcapsular right lobe measures 10 mm. No progression or new lesion. Calcified gallstones within decompressed gallbladder. No pericholecystic fat stranding. There is no intra or extrahepatic biliary ductal dilatation. No calcified choledocholithiasis. Pancreas: Motion artifact through the head and uncinate process of the pancreas. No evidence of focal or obstructing lesion. No ductal dilatation or inflammation. Spleen: Normal in size without focal abnormality. Adrenals/Urinary Tract: Normal adrenal glands  without nodule. Motion artifact through the kidneys. No hydronephrosis or perinephric edema. Homogeneous renal enhancement with symmetric excretion on delayed phase imaging. Again seen 8 mm cyst in the lower left kidney. Urinary bladder is partially distended without wall thickening. Stomach/Bowel: Evaluation of bowel loops in the mid abdomen is limited given patient motion. Equivocal Peri pyloric gastric wall thickening, series 3, image 57. No proximal gastric obstruction or abnormal distention. No small bowel distension. No obvious bowel inflammation or evidence of bowel mass. Appendix not confidently visualized. Vascular/Lymphatic: Aortic atherosclerosis. No aortic aneurysm. Patent portal vein. Assessment for adenopathy is limited given motion. Allowing for this, no bulky abdominopelvic lymph nodes. Reproductive: Enlarged prostate gland spanning 5.4 cm transverse. There is mild mass effect on the bladder base. Other: No ascites or free air. No evidence of intra-abdominal mass allowing for motion. Umbilical hernia on prior exam is obscured by motion. Musculoskeletal: No acute osseous abnormalities or focal osseous lesion. IMPRESSION: 1. No evidence of malignancy in the chest, abdomen, or pelvis allowing for patient motion. 2.  Gallstones in contracted gallbladder. There is no biliary obstruction or abnormal distention. No explanation for jaundice. 3. Mild hepatic steatosis is well as scattered subcentimeter hepatic lesions stable from CT 4 months ago. 4. Equivocal peri pyloric gastric wall thickening, can be seen with gastritis. 5. Enlarged prostate gland. 6. Punctate perifissural nodule in the right middle lobe is unchanged. Aortic Atherosclerosis (ICD10-I70.0). Electronically Signed   By: Keith Rake M.D.   On: 05/16/2020 19:49   US Abdomen Limited RUQ (LIVER/GB)  Result Date: 05/16/2020 CLINICAL DATA:  Transaminitis. EXAM: ULTRASOUND ABDOMEN LIMITED RIGHT UPPER QUADRANT COMPARISON:  None. FINDINGS:  Gallbladder: The gallbladder is contracted. Suggestion of a gallstone within the gallbladder lumen measuring up to 7 mm. No wall thickening visualized. No sonographic Murphy sign noted by sonographer. Common bile duct: Diameter: 3 mm Liver: No focal lesion identified. Within normal limits parenchymal echogenicity. Portal vein is patent on color Doppler imaging with normal direction of blood flow towards the liver. Other: Pancreas not visualized. IMPRESSION: 1. Limited evaluation due to contracted gallbladder and bowel gas. Pancreas not visualized. 2. Suggestion of cholelithiasis. Electronically Signed   By: Iven Finn M.D.   On: 05/16/2020 17:04    EKG: I independently viewed the EKG done and my findings are as followed: None available at the time of this visit.  Assessment/Plan Present on Admission: . Jaundice  Active Problems:   Jaundice  Jaundice, hyperbilirubinemia, unclear etiology Presented as a referral from his oncologist [name] due to jaundice and abnormal labs with acute transaminitis and T bili of greater than 23 from oncologist office CT abdomen and pelvis as well as abdominal ultrasound revealed evidence of calcified cholelithiasis, no evidence of common bile duct dilatation, no evidence of calcified choledocholithiasis Acute hepatitis panel negative on 05/16/2020 INR 1.2, mild abdominal distention Endorses no frequent use of tylenol, last tylenol use was once last week. Avoid hepatotoxic agents, hold off home statin Repeat CMP  The writer sent secure chat to Dr. Collene Mares, on call GI provider at [place] in the morning May need MRCP or ERCP, defer to GI.  Acute transaminitis, unclear etiology Presented with markedly elevated LFTs No frequent use of tylenol Hold off home statin Avoid hepatotoxic agents Trend LFTs GI consult  Mild hypovolemic hyponatremia Presented with serum sodium 133 Start normal saline at 50 cc/h x 1 day. Repeat chemistry panel in the  morning  Mild hypokalemia Presented with serum potassium 3.4 Repleted orally Repeat chemistry panel in the morning Obtain magnesium level in the morning  Cystitis, diagnosed and treated outpatient Completed 5 days of po levaquin outpatient  Hyperlipidemia Hold off home Lipitor  Recently diagnosed glioblastoma of frontal lobe Diagnosed on February 13 2020 Completed 6 weeks of radiation Follows with [name] Resume home Decadron  Seizure disorder Resume home Keppra Seizure precautions   DVT prophylaxis: Subcu Lovenox daily  Code Status: Full code as stated by the patient.   Family Communication: None at bedside  Disposition Plan: Likely will discharge to home when hemodynamically stable.  Consults called: Consult GI in the morning.  Sent secure chat to Dr. Collene Mares on 05/16/20 for a consult.  Admission status: Observation status.   Status is: Observation    Dispo:  Patient From: Home  Planned Disposition: Home  Expected discharge date: 05/17/2020  Medically stable for discharge: No, ongoing management of jaundice with T bili of >23, and acute transaminitis.   Kayleen Memos MD [facility] Pager [phone]  If 7PM-7AM, please contact night-coverage [email or web address]  Password [email or web address]  05/16/2020, 9:31 PM

## 2020-05-16 NOTE — ED Triage Notes (Signed)
Pt here pov from cancer center due to elevated LFT. Pt with noticeable jaundice.

## 2020-05-17 ENCOUNTER — Encounter (HOSPITAL_COMMUNITY): Payer: Self-pay | Admitting: Internal Medicine

## 2020-05-17 LAB — CBC WITH DIFFERENTIAL/PLATELET
Abs Immature Granulocytes: 1.66 10*3/uL — ABNORMAL HIGH (ref 0.00–0.07)
Basophils Absolute: 0.1 10*3/uL (ref 0.0–0.1)
Basophils Relative: 0 %
Eosinophils Absolute: 0.3 10*3/uL (ref 0.0–0.5)
Eosinophils Relative: 2 %
HCT: 31.5 % — ABNORMAL LOW (ref 39.0–52.0)
Hemoglobin: 11.1 g/dL — ABNORMAL LOW (ref 13.0–17.0)
Immature Granulocytes: 13 %
Lymphocytes Relative: 11 %
Lymphs Abs: 1.4 10*3/uL (ref 0.7–4.0)
MCH: 31.5 pg (ref 26.0–34.0)
MCHC: 35.2 g/dL (ref 30.0–36.0)
MCV: 89.5 fL (ref 80.0–100.0)
Monocytes Absolute: 1.6 10*3/uL — ABNORMAL HIGH (ref 0.1–1.0)
Monocytes Relative: 13 %
Neutro Abs: 7.6 10*3/uL (ref 1.7–7.7)
Neutrophils Relative %: 61 %
Platelets: 284 10*3/uL (ref 150–400)
RBC: 3.52 MIL/uL — ABNORMAL LOW (ref 4.22–5.81)
RDW: 22.9 % — ABNORMAL HIGH (ref 11.5–15.5)
WBC: 12.5 10*3/uL — ABNORMAL HIGH (ref 4.0–10.5)
nRBC: 0.5 % — ABNORMAL HIGH (ref 0.0–0.2)

## 2020-05-17 LAB — PROTIME-INR
INR: 1.2 (ref 0.8–1.2)
Prothrombin Time: 14.5 seconds (ref 11.4–15.2)

## 2020-05-17 LAB — CBC
HCT: 30.4 % — ABNORMAL LOW (ref 39.0–52.0)
Hemoglobin: 11.3 g/dL — ABNORMAL LOW (ref 13.0–17.0)
MCH: 32.4 pg (ref 26.0–34.0)
MCHC: 37.2 g/dL — ABNORMAL HIGH (ref 30.0–36.0)
MCV: 87.1 fL (ref 80.0–100.0)
Platelets: 281 10*3/uL (ref 150–400)
RBC: 3.49 MIL/uL — ABNORMAL LOW (ref 4.22–5.81)
RDW: 22.7 % — ABNORMAL HIGH (ref 11.5–15.5)
WBC: 10.4 10*3/uL (ref 4.0–10.5)
nRBC: 0.7 % — ABNORMAL HIGH (ref 0.0–0.2)

## 2020-05-17 LAB — COMPREHENSIVE METABOLIC PANEL
ALT: 1857 U/L — ABNORMAL HIGH (ref 0–44)
AST: 525 U/L — ABNORMAL HIGH (ref 15–41)
Albumin: 2.6 g/dL — ABNORMAL LOW (ref 3.5–5.0)
Alkaline Phosphatase: 449 U/L — ABNORMAL HIGH (ref 38–126)
Anion gap: 12 (ref 5–15)
BUN: 14 mg/dL (ref 6–20)
CO2: 18 mmol/L — ABNORMAL LOW (ref 22–32)
Calcium: 8.6 mg/dL — ABNORMAL LOW (ref 8.9–10.3)
Chloride: 102 mmol/L (ref 98–111)
Creatinine, Ser: 0.97 mg/dL (ref 0.61–1.24)
GFR, Estimated: >60 mL/min (ref >60)
Glucose, Bld: 95 mg/dL (ref 70–99)
Potassium: 5.5 mmol/L — ABNORMAL HIGH (ref 3.5–5.1)
Sodium: 132 mmol/L — ABNORMAL LOW (ref 135–145)
Total Bilirubin: 22.9 mg/dL (ref 0.3–1.2)
Total Protein: 6.1 g/dL — ABNORMAL LOW (ref 6.5–8.1)

## 2020-05-17 LAB — MAGNESIUM: Magnesium: 2.2 mg/dL (ref 1.7–2.4)

## 2020-05-17 LAB — FERRITIN: Ferritin: 5807 ng/mL — ABNORMAL HIGH (ref 24–336)

## 2020-05-17 MED ORDER — CHLORPROMAZINE HCL 25 MG PO TABS
25.0000 mg | ORAL_TABLET | Freq: Three times a day (TID) | ORAL | Status: DC | PRN
  Administered 2020-05-17 – 2020-05-20 (×8): 25 mg via ORAL
  Filled 2020-05-17 (×9): qty 1

## 2020-05-17 MED ORDER — CHLORPROMAZINE HCL 25 MG PO TABS
25.0000 mg | ORAL_TABLET | Freq: Three times a day (TID) | ORAL | Status: DC | PRN
  Administered 2020-05-17: 25 mg via ORAL
  Filled 2020-05-17 (×2): qty 1

## 2020-05-17 MED ORDER — BACLOFEN 10 MG PO TABS
10.0000 mg | ORAL_TABLET | Freq: Three times a day (TID) | ORAL | Status: DC | PRN
  Administered 2020-05-17: 10 mg via ORAL
  Filled 2020-05-17 (×3): qty 1

## 2020-05-17 NOTE — ED Notes (Signed)
Tele; Breakfast Order placed 

## 2020-05-17 NOTE — Consult Note (Signed)
UNASSIGNED PATIENT CROSS COVER [facility] Reason for Consult: Total bilirubin of 23. Referring Physician: [facility] hospitalist.  John Watson is an 59 y.o. male.  HPI: John Watson is a 59 year old black male who was recently diagnosed with glioblastoma of the frontal lobe on 02/13/2020 for which she completed 6-week course of radiation and oral antral neoplastic agent.  He is also had a previous history of seizure disorder a prior CVA and has a history of hyperlipidemia.  He was sent to the emergency room by his oncologist [name] noted an elevation in his total bilirubin to 22.9 with an AST of 525 and ALT of 1857 and alkaline phosphatase of 449. Patient denies having any any abdominal pain nausea vomiting fever chills or rigors.  On CT scan of the abdomen he was noted to have several calcified gallstones with a decompressed gallbladder with with no evidence of ductal dilation whatsoever and no evidence of cholelithiasis.  Acute hepatitis serologies were negative.  Past Medical History:  Diagnosis Date  . Glioblastoma ([place]) 02/13/2020  . Pneumonia 2012  . Seizure ([place]) 8/16/211   last one 02/02/2020  . Stroke [facility])    Left leg weakness   Past Surgical History:  Procedure Laterality Date  . APPLICATION OF CRANIAL NAVIGATION Right 02/13/2020   Procedure: APPLICATION OF CRANIAL NAVIGATION;  Surgeon: [name], [name], DO;  Location: [place];  Service: Neurosurgery;  Laterality: Right;  APPLICATION OF CRANIAL NAVIGATION  . BUBBLE STUDY  01/17/2020   Procedure: BUBBLE STUDY;  Surgeon: [name], MD;  Location: [place];  Service: Cardiovascular;;  . FRAMELESS  BIOPSY WITH BRAINLAB Right 02/13/2020   Procedure: STEREOTACTIC BRAIN BIOPSY OF RIGHT SIDED LESION;  Surgeon: [name], DO;  Location: [place];  Service: Neurosurgery;  Laterality: Right;  STEREOTACTIC BRAIN BIOPSY OF RIGHT SIDED LESION  . LOOP RECORDER INSERTION N/A 01/17/2020   Procedure: LOOP RECORDER INSERTION;  Surgeon:  [name], MD;  Location: [place] CV LAB;  Service: Cardiovascular;  Laterality: N/A;  . TEE WITHOUT CARDIOVERSION N/A 01/17/2020   Procedure: TRANSESOPHAGEAL ECHOCARDIOGRAM (TEE);  Surgeon: [name], MD;  Location: [facility];  Service: Cardiovascular;  Laterality: N/A;    Family History  Problem Relation Age of Onset  . Hypertension Mother   . Lung cancer Father   . Hyperlipidemia Sister   . Hyperlipidemia Brother     Social History:  reports that he quit smoking about 19 years ago. His smoking use included cigars. He quit after 2.00 years of use. He has never used smokeless tobacco. He reports current alcohol use of about 1.0 standard drink of alcohol per week. He reports that he does not use drugs.  Allergies:  Allergies  Allergen Reactions  . Vicodin [Hydrocodone-Acetaminophen] Anaphylaxis   Medications: I have reviewed the patient's current medications.  Results for orders placed or performed during the hospital encounter of 05/16/20 (from the past 48 hour(s))  Bilirubin, direct     Status: Abnormal   Collection Time: 05/16/20  4:21 PM  Result Value Ref Range   Bilirubin, Direct 13.8 (H) 0.0 - 0.2 mg/dL    Comment: RESULTS CONFIRMED BY MANUAL DILUTION Performed at [facility] [address], [place], [place] [address]   Hepatitis panel, acute     Status: None   Collection Time: 05/16/20  4:21 PM  Result Value Ref Range   Hepatitis B Surface Ag NON REACTIVE NON REACTIVE   HCV Ab NON REACTIVE NON REACTIVE  Comment: (NOTE) Nonreactive HCV antibody screen is consistent with no HCV infections,  unless recent infection is suspected or other evidence exists to indicate HCV infection.     Hep A IgM NON REACTIVE NON REACTIVE   Hep B C IgM NON REACTIVE NON REACTIVE    Comment: Performed at [facility], [place] [address] [place], [place] [address]  Protime-INR     Status: None   Collection Time: 05/16/20  4:21 PM  Result Value Ref  Range   Prothrombin Time 14.4 11.4 - 15.2 seconds   INR 1.2 0.8 - 1.2    Comment: (NOTE) INR goal varies based on device and disease states. Performed at [facility], [place] [address], [place], [place] [address]   APTT     Status: None   Collection Time: 05/16/20  4:21 PM  Result Value Ref Range   aPTT 27 24 - 36 seconds    Comment: Performed at [facility] [address] [place], [place] [address]  Comprehensive metabolic panel     Status: Abnormal   Collection Time: 05/16/20  4:21 PM  Result Value Ref Range   Sodium 134 (L) 135 - 145 mmol/L   Potassium 3.3 (L) 3.5 - 5.1 mmol/L   Chloride 98 98 - 111 mmol/L   CO2 19 (L) 22 - 32 mmol/L   Glucose, Bld 135 (H) 70 - 99 mg/dL    Comment: Glucose reference range applies only to samples taken after fasting for at least 8 hours.   BUN 12 6 - 20 mg/dL   Creatinine, Ser 0.91 0.61 - 1.24 mg/dL   Calcium 8.9 8.9 - 10.3 mg/dL   Total Protein 5.8 (L) 6.5 - 8.1 g/dL   Albumin 2.6 (L) 3.5 - 5.0 g/dL   AST 488 (H) 15 - 41 U/L   ALT 1,826 (H) 0 - 44 U/L   Alkaline Phosphatase 432 (H) 38 - 126 U/L   Total Bilirubin 20.5 (HH) 0.3 - 1.2 mg/dL    Comment: CRITICAL RESULT CALLED TO, READ BACK BY AND VERIFIED WITH: CHRISCO C,RN 05/16/20 2157 WAYK    GFR, Estimated >60 >60 mL/min    Comment: (NOTE) Calculated using the CKD-EPI Creatinine Equation (2021)    Anion gap 17 (H) 5 - 15    Comment: Performed at [facility], [place] [address] [place],  [address]  Resp Panel by RT-PCR (Flu A&B, Covid) Nasopharyngeal Swab     Status: None   Collection Time: 05/16/20  5:23 PM   Specimen: Nasopharyngeal Swab; Nasopharyngeal(NP) swabs in vial transport medium  Result Value Ref Range   SARS Coronavirus 2 by RT PCR NEGATIVE NEGATIVE    Comment: (NOTE) SARS-CoV-2 target nucleic acids are NOT DETECTED.  The SARS-CoV-2 RNA is generally detectable in upper respiratory specimens during the acute phase of infection. The  lowest concentration of SARS-CoV-2 viral copies this assay can detect is 138 copies/mL. A negative result does not preclude SARS-Cov-2 infection and should not be used as the sole basis for treatment or other patient management decisions. A negative result may occur with  improper specimen collection/handling, submission of specimen other than nasopharyngeal swab, presence of viral mutation(s) within the areas targeted by this assay, and inadequate number of viral copies(<138 copies/mL). A negative result must be combined with clinical observations, patient history, and epidemiological information. The expected result is Negative.  Fact Sheet for Patients:  EntrepreneurPulse.com.au  Fact Sheet for Healthcare Providers:  IncredibleEmployment.be  This  test is no t yet approved or cleared by the [place] and  has been authorized for detection and/or diagnosis of SARS-CoV-2 by FDA under an Emergency Use Authorization (EUA). This EUA will remain  in effect (meaning this test can be used) for the duration of the COVID-19 declaration under Section 564(b)(1) of the Act, 21 U.S.C.section 360bbb-3(b)(1), unless the authorization is terminated  or revoked sooner.       Influenza A by PCR NEGATIVE NEGATIVE   Influenza B by PCR NEGATIVE NEGATIVE    Comment: (NOTE) The Xpert Xpress SARS-CoV-2/FLU/RSV plus assay is intended as an aid in the diagnosis of influenza from Nasopharyngeal swab specimens and should not be used as a sole basis for treatment. Nasal washings and aspirates are unacceptable for Xpert Xpress SARS-CoV-2/FLU/RSV testing.  Fact Sheet for Patients: EntrepreneurPulse.com.au  Fact Sheet for Healthcare Providers: IncredibleEmployment.be  This test is not yet approved or cleared by the [place] FDA and has been authorized for detection and/or diagnosis of SARS-CoV-2 by FDA under an Emergency  Use Authorization (EUA). This EUA will remain in effect (meaning this test can be used) for the duration of the COVID-19 declaration under Section 564(b)(1) of the Act, 21 U.S.C. section 360bbb-3(b)(1), unless the authorization is terminated or revoked.  Performed at [facility], [place] [address], [place], [place] [address]   Magnesium     Status: None   Collection Time: 05/16/20 10:00 PM  Result Value Ref Range   Magnesium 1.9 1.7 - 2.4 mg/dL    Comment: Performed at [facility], [place] [address], [place], [place] [address]    CT CHEST ABDOMEN PELVIS W CONTRAST  Result Date: 05/16/2020 CLINICAL DATA:  Abdominal mass, intra-abdominal neoplasm suspected hx glioblastoma, acute transaminitis with jaundice History of glioblastoma. EXAM: CT CHEST, ABDOMEN, AND PELVIS WITH CONTRAST TECHNIQUE: Multidetector CT imaging of the chest, abdomen and pelvis was performed following the standard protocol during bolus administration of intravenous contrast. CONTRAST:  159mL OMNIPAQUE IOHEXOL 300 MG/ML  SOLN COMPARISON:  Abdominal ultrasound earlier today. Chest abdomen pelvis CT 01/15/2020 FINDINGS: CT CHEST FINDINGS Cardiovascular: Mild aortic atherosclerosis. No aortic aneurysm or dissection. Central pulmonary arteries are well opacified. Heart is normal in size. No pericardial effusion. Mediastinum/Nodes: No enlarged mediastinal or hilar lymph nodes. No axillary adenopathy. No thyroid nodule. No esophageal wall thickening. Lungs/Pleura: Minor dependent atelectasis in both lung bases. Additional linear subsegmental atelectasis in the right lower lobe. No confluent consolidation. There is no pulmonary mass. Tiny calcified nodule at the right lung apex consistent with prior granulomatous disease. Stable tiny perifissural nodule in the right middle lobe, series 5, image 84. No new or progressive pulmonary nodule. Trachea and central bronchi are patent. No pleural fluid. No findings of pulmonary edema.  Musculoskeletal: No acute osseous abnormality or focal bone lesion. Scattered Schmorl's nodes in the thoracic spine. CT ABDOMEN PELVIS FINDINGS Hepatobiliary: Mild hepatic steatosis. No significant change in the scattered subcentimeter hepatic hypodensities from prior exam. Largest lesion in the subcapsular right lobe measures 10 mm. No progression or new lesion. Calcified gallstones within decompressed gallbladder. No pericholecystic fat stranding. There is no intra or extrahepatic biliary ductal dilatation. No calcified choledocholithiasis. Pancreas: Motion artifact through the head and uncinate process of the pancreas. No evidence of focal or obstructing lesion. No ductal dilatation or inflammation. Spleen: Normal in size without focal abnormality. Adrenals/Urinary Tract: Normal adrenal glands without nodule. Motion artifact through the kidneys. No hydronephrosis or perinephric edema. Homogeneous renal enhancement with  symmetric excretion on delayed phase imaging. Again seen 8 mm cyst in the lower left kidney. Urinary bladder is partially distended without wall thickening. Stomach/Bowel: Evaluation of bowel loops in the mid abdomen is limited given patient motion. Equivocal Peri pyloric gastric wall thickening, series 3, image 57. No proximal gastric obstruction or abnormal distention. No small bowel distension. No obvious bowel inflammation or evidence of bowel mass. Appendix not confidently visualized. Vascular/Lymphatic: Aortic atherosclerosis. No aortic aneurysm. Patent portal vein. Assessment for adenopathy is limited given motion. Allowing for this, no bulky abdominopelvic lymph nodes. Reproductive: Enlarged prostate gland spanning 5.4 cm transverse. There is mild mass effect on the bladder base. Other: No ascites or free air. No evidence of intra-abdominal mass allowing for motion. Umbilical hernia on prior exam is obscured by motion. Musculoskeletal: No acute osseous abnormalities or focal osseous lesion.  IMPRESSION: 1. No evidence of malignancy in the chest, abdomen, or pelvis allowing for patient motion. 2. Gallstones in contracted gallbladder. There is no biliary obstruction or abnormal distention. No explanation for jaundice. 3. Mild hepatic steatosis is well as scattered subcentimeter hepatic lesions stable from CT 4 months ago. 4. Equivocal peri pyloric gastric wall thickening, can be seen with gastritis. 5. Enlarged prostate gland. 6. Punctate perifissural nodule in the right middle lobe is unchanged. Aortic Atherosclerosis (ICD10-I70.0). Electronically Signed   By: Keith Rake M.D.   On: 05/16/2020 19:49   US Abdomen Limited RUQ (LIVER/GB)  Result Date: 05/16/2020 CLINICAL DATA:  Transaminitis. EXAM: ULTRASOUND ABDOMEN LIMITED RIGHT UPPER QUADRANT COMPARISON:  None. FINDINGS: Gallbladder: The gallbladder is contracted. Suggestion of a gallstone within the gallbladder lumen measuring up to 7 mm. No wall thickening visualized. No sonographic Murphy sign noted by sonographer. Common bile duct: Diameter: 3 mm Liver: No focal lesion identified. Within normal limits parenchymal echogenicity. Portal vein is patent on color Doppler imaging with normal direction of blood flow towards the liver. Other: Pancreas not visualized. IMPRESSION: 1. Limited evaluation due to contracted gallbladder and bowel gas. Pancreas not visualized. 2. Suggestion of cholelithiasis. Electronically Signed   By: Iven Finn M.D.   On: 05/16/2020 17:04   Review of Systems  Constitutional: Negative.   HENT: Negative.   Eyes: Negative.   Respiratory: Negative.   Cardiovascular: Negative.   Gastrointestinal: Negative.   Endocrine: Negative.   Genitourinary: Negative.   Musculoskeletal: Negative.   Skin: Negative.   Allergic/Immunologic: Negative.   Neurological: Positive for seizures. Negative for dizziness, tremors, syncope, facial asymmetry, speech difficulty, weakness, light-headedness, numbness and headaches.   Hematological: Negative.   Psychiatric/Behavioral: Negative.    Blood pressure 115/74, pulse 62, temperature 98.3 F (36.8 C), temperature source Oral, resp. rate 14, height 5\' 11"  (1.803 m), weight 77.1 kg, SpO2 98 %. Physical Exam Vitals reviewed.  Constitutional:      General: He is not in acute distress.    Appearance: Normal appearance. He is not toxic-appearing.  HENT:     Head: Normocephalic.     Mouth/Throat:     Mouth: Mucous membranes are moist.  Eyes:     General: Scleral icterus present.     Extraocular Movements: Extraocular movements intact.     Pupils: Pupils are equal, round, and reactive to light.  Cardiovascular:     Rate and Rhythm: Normal rate and regular rhythm.     Pulses: Normal pulses.     Heart sounds: Normal heart sounds.  Pulmonary:     Effort: Pulmonary effort is normal.     Breath sounds: Normal  breath sounds.  Abdominal:     General: Bowel sounds are normal. There is no distension.     Palpations: There is no mass.     Tenderness: There is no abdominal tenderness. There is no guarding.  Musculoskeletal:        General: Normal range of motion.     Cervical back: Normal range of motion and neck supple.  Skin:    General: Skin is warm and dry.  Neurological:     Mental Status: He is alert and oriented to person, place, and time.  Psychiatric:        Mood and Affect: Mood normal.        Behavior: Behavior normal.        Thought Content: Thought content normal.        Judgment: Judgment normal.   Assessment/Plan: 1) Scleral icterus with total bilirubin 22.9/elevated AST ALT and alkaline phosphatase-this hyperbilirubinemia is thought to be secondary to his chemotherapy.  There is no evidence of choledocholithiasis and therefore no emergency ERCP is necessary at this time. 2) Cholelithiasis-this seems to be an incidental finding.  Patient does not seem to have any symptoms with regards to his gallstones.. 3) frontal glioblastoma diagnosed in  September of this year patient underwent chemotherapy and radiation for 6 weeks and is being followed by [name] at the [facility].  Juanita Craver 05/17/2020, 6:42 AM

## 2020-05-17 NOTE — Plan of Care (Signed)

## 2020-05-17 NOTE — Evaluation (Signed)
Physical Therapy Evaluation Patient Details Name: John Watson MRN: [id number] DOB: [birth year] Today's Date: 05/17/2020   History of Present Illness  59 year old male who presented to the hospital from MD office with jaundice. He was found to have increased LFT and billirubin. History: glioblastoma, stroke, seizure, PNA  Clinical Impression  Patient ambulated 100' with a walker then 29' without the walker. Without the walker he drifts to the left slightly. He reports this is baseline for him. Prior to admission he was walking outside at least 1x daily. He appears to be at his baseline mobility. He has no need for skilled therapy at this time.     Follow Up Recommendations No PT follow up    Equipment Recommendations  None recommended by PT    Recommendations for Other Services       Precautions / Restrictions Precautions Precautions: None Restrictions Weight Bearing Restrictions: No      Mobility  Bed Mobility Overal bed mobility: Independent             General bed mobility comments: sat up edge of the bed and got back in without assist    Transfers Overall transfer level: Independent               General transfer comment: slow transfer but good stability  Ambulation/Gait Ambulation/Gait assistance: Independent Gait Distance (Feet): 160 Feet (100 and 60) Assistive device: Rolling walker (2 wheeled);None Gait Pattern/deviations: Drifts right/left Gait velocity: decreased   General Gait Details: Patient ambualted 100' with walker. When he got back he wanted to try without a walker. he ambualted an extra 60'. He drifted to the left without the walker but this is baseline. he reports he walks 300 yards a day at [employer] for exercises and for the past few months he has had a drift  Stairs            Wheelchair Mobility    Modified Rankin (Stroke Patients Only)       Balance                                              Pertinent Vitals/Pain Pain Assessment: No/denies pain    Home Living Family/patient expects to be discharged to:: Private residence Living Arrangements: Other relatives Available Help at Discharge: Family;Available 24 hours/day Type of Home: House Home Access: Stairs to enter   [employer] of Steps: 6 Home Layout: One level Home Equipment: Grab bars - tub/shower      Prior Function Level of Independence: Independent         Comments: Patient reports he was active but somewhat limited prior to admission     Hand Dominance   Dominant Hand: Right    Extremity/Trunk Assessment   Upper Extremity Assessment Upper Extremity Assessment: Overall WFL for tasks assessed    Lower Extremity Assessment Lower Extremity Assessment: Overall WFL for tasks assessed       Communication   Communication: No difficulties  Cognition Arousal/Alertness: Awake/alert Behavior During Therapy: WFL for tasks assessed/performed Overall Cognitive Status: Within Functional Limits for tasks assessed                                        General Comments General comments (skin integrity, edema, etc.): jundice  Exercises     Assessment/Plan    PT Assessment Patent does not need any further PT services  PT Problem List         PT Treatment Interventions      PT Goals (Current goals can be found in the Care Plan section)  Acute Rehab PT Goals Patient Stated Goal: to go home PT Goal Formulation: With patient Time For Goal Achievement: 05/24/20 (w+1) Potential to Achieve Goals: Good    Frequency     Barriers to discharge        Co-evaluation               AM-PAC PT "6 Clicks" Mobility  Outcome Measure Help needed turning from your back to your side while in a flat bed without using bedrails?: None Help needed moving from lying on your back to sitting on the side of a flat bed without using bedrails?: None Help needed moving to and  from a bed to a chair (including a wheelchair)?: None Help needed standing up from a chair using your arms (e.g., wheelchair or bedside chair)?: None Help needed to walk in hospital room?: None Help needed climbing 3-5 steps with a railing? : None 6 Click Score: 24    End of Session Equipment Utilized During Treatment: Gait belt Activity Tolerance: Patient tolerated treatment well Patient left: in bed;with call bell/phone within reach Nurse Communication: Mobility status PT Visit Diagnosis: Other abnormalities of gait and mobility (R26.89)    Time: 6438-3779 PT Time Calculation (min) (ACUTE ONLY): 15 min   Charges:   PT Evaluation $PT Eval Moderate Complexity: 1 Mod          Carney Living PT DPT  05/17/2020, 3:40 PM

## 2020-05-17 NOTE — ED Notes (Signed)
Pt having frequent trips to the br  Med given or his hiccoughs

## 2020-05-17 NOTE — Progress Notes (Signed)
PROGRESS NOTE  John Watson  DOB: 1961/02/07  PCP: Kathyrn Lass OBS:[id number]  DOA: 05/16/2020  LOS: 0 days   Chief Complaint  Patient presents with  . Abnormal Lab   Brief narrative: John Watson is a 59 y.o. male with PMH significant for recently diagnosed glioblastoma of frontal lobe (02/13/20), completed 6 weeks of radiation and oral antineoplastic angent, seizure disorder, prior CVA, hyperlipidemia. Patient was sent to ED on 12/17 by his oncologist [name] for abnormal labs.   On examination by the oncologist, he was found to have icteric sclera.  He endorsed abdominal distention and progressive generalized weakness for last several days. Labs showed markedly elevated LFT with total bilirubin more than 23.  CT abdomen and pelvis with contrast and abdominal ultrasound remarkable for calcified gallstones within decompressed gallbladder, no intra or extrahepatic biliary ductal dilatation, no calcified choledocholithiasis, mild hepatic steatosis, scattered subcentimeter hepatic lesions stable from CT scan done 4 months ago.   Acute hepatitis panel negative.   Patient admitted to hospitalist service.  Subjective: Patient was seen and examined this morning.  Middle-aged African-American male.  Lying on bed.  Not in distress.  No new symptoms. Chart reviewed.  Afebrile, hemodynamically stable.  Assessment/Plan: Elevated liver enzymes including bilirubin -Liver enzyme trend as below. -Unclear etiology.  Acute hepatitis panel negative. -Per oncology, po chemotherapy medication which he completed on 04/25/20 is less likely to have caused elevated LFTs. -Keep statin on hold. -GI consult appreciated.  Apparently he does not have enlarged CBD and does not need any further intervention. -I sent for ferritin level. -I see that he takes Thorazine and baclofen. Unclear cause of elevated liver enzymes. We will stop these medicines for now. Recent Labs  Lab 05/16/20 1130 05/16/20 1621  05/17/20 0541  AST 564* 488* 525*  ALT 2,257* 1,826* 1,857*  ALKPHOS 586* 432* 449*  BILITOT 23.6* 20.5* 22.9*  PROT 6.5 5.8* 6.1*  ALBUMIN 2.6* 2.6* 2.6*   Mild hypovolemic hyponatremia -Stable between 130 and 135. Recent Labs  Lab 05/16/20 1130 05/16/20 1621 05/17/20 0541  NA 133* 134* 132*   Hypokalemia/hyperkalemia -Potassium level was low yesterday on admission.  This morning 8 is on the higher side of normal. -Continue to monitor. Recent Labs  Lab 05/16/20 1130 05/16/20 1621 05/16/20 2200 05/17/20 0541  K 3.4* 3.3*  --  5.5*  MG  --   --  1.9 2.2   UTI/cystitis, diagnosed and treated outpatient -Completed 5 days of po levaquin outpatient  Hyperlipidemia -Keep statin on hold because of elevated liver enzymes.  Recently diagnosed glioblastoma of frontal lobe -Diagnosed on February 13 2020 -Completed 6 weeks of radiation -Follows with [name] -Continue Decadron  Seizure disorder -Resume home Keppra -Seizure precautions  Mobility: Encourage ambulation Code Status:   Code Status: Full Code  Nutritional status: Body mass index is 26.17 kg/m.     Diet Order            Diet regular Room service appropriate? Yes; Fluid consistency: Thin  Diet effective now                 DVT prophylaxis: enoxaparin (LOVENOX) injection 40 mg Start: 05/16/20 2200   Antimicrobials:  None Fluid: Normal saline at 50 mill per hour Consultants: GI Family Communication:  Not at bedside  Status is: Observation  The patient will require care spanning > 2 midnights and should be moved to inpatient because: Continues to have elevated liver enzymes, need further investigation.  Dispo:  Patient From: Home  Planned Disposition: Home  Expected discharge date: 05/17/2020  Medically stable for discharge: No   Infusions:  . sodium chloride 50 mL/hr at 05/17/20 0010    Scheduled Meds: . dexamethasone  2 mg Oral Daily  . enoxaparin (LOVENOX) injection  40 mg  Subcutaneous Q24H  . levETIRAcetam  1,000 mg Oral BID  . multivitamin with minerals  1 tablet Oral Daily    Antimicrobials: Anti-infectives (From admission, onward)   None      PRN meds:    Objective: Vitals:   05/17/20 0730 05/17/20 0824  BP: 120/78 136/87  Pulse: (!) 57 73  Resp: 11 12  Temp:  97.9 F (36.6 C)  SpO2: 97% 96%   No intake or output data in the 24 hours ending 05/17/20 1124 Filed Weights   05/16/20 1411 05/17/20 0824  Weight: 77.1 kg 85.1 kg   Weight change:  Body mass index is 26.17 kg/m.   Physical Exam: General exam: Pleasant, middle-aged African-American male.  Not in distress Skin: No rashes, lesions or ulcers. HEENT: Atraumatic, normocephalic, no obvious bleeding.  Icteric sclera Lungs: Clear to auscultation bilaterally CVS: Regular rate and rhythm, no murmur GI/Abd soft, nontender, nondistended, bowel sound present CNS: Alert, awake, oriented x3 Psychiatry: Mood appropriate Extremities: No pedal edema, no calf tenderness  Data Review: I have personally reviewed the laboratory data and studies available.  Recent Labs  Lab 05/16/20 1130 05/17/20 0541  WBC 13.4* 12.5*  NEUTROABS 8.9* 7.6  HGB 11.5* 11.1*  HCT 32.5* 31.5*  MCV 89.3 89.5  PLT 285 284   Recent Labs  Lab 05/16/20 1130 05/16/20 1621 05/16/20 2200 05/17/20 0541  NA 133* 134*  --  132*  K 3.4* 3.3*  --  5.5*  CL 101 98  --  102  CO2 23 19*  --  18*  GLUCOSE 124* 135*  --  95  BUN 15 12  --  14  CREATININE 1.16 0.91  --  0.97  CALCIUM 9.3 8.9  --  8.6*  MG  --   --  1.9 2.2    F/u labs ordered.  Signed, Terrilee Croak, MD [facility] 05/17/2020

## 2020-05-18 LAB — COMPREHENSIVE METABOLIC PANEL
ALT: 1613 U/L — ABNORMAL HIGH (ref 0–44)
AST: 421 U/L — ABNORMAL HIGH (ref 15–41)
Albumin: 2.2 g/dL — ABNORMAL LOW (ref 3.5–5.0)
Alkaline Phosphatase: 402 U/L — ABNORMAL HIGH (ref 38–126)
Anion gap: 9 (ref 5–15)
BUN: 14 mg/dL (ref 6–20)
CO2: 22 mmol/L (ref 22–32)
Calcium: 8.6 mg/dL — ABNORMAL LOW (ref 8.9–10.3)
Chloride: 104 mmol/L (ref 98–111)
Creatinine, Ser: 0.94 mg/dL (ref 0.61–1.24)
GFR, Estimated: >60 mL/min (ref >60)
Glucose, Bld: 113 mg/dL — ABNORMAL HIGH (ref 70–99)
Potassium: 3.5 mmol/L (ref 3.5–5.1)
Sodium: 135 mmol/L (ref 135–145)
Total Bilirubin: 19 mg/dL (ref 0.3–1.2)
Total Protein: 5.2 g/dL — ABNORMAL LOW (ref 6.5–8.1)

## 2020-05-18 LAB — CBC WITH DIFFERENTIAL/PLATELET
Abs Immature Granulocytes: 0 10*3/uL (ref 0.00–0.07)
Basophils Absolute: 0.1 10*3/uL (ref 0.0–0.1)
Basophils Relative: 1 %
Eosinophils Absolute: 0.3 10*3/uL (ref 0.0–0.5)
Eosinophils Relative: 3 %
HCT: 27.3 % — ABNORMAL LOW (ref 39.0–52.0)
Hemoglobin: 9.8 g/dL — ABNORMAL LOW (ref 13.0–17.0)
Lymphocytes Relative: 5 %
Lymphs Abs: 0.5 10*3/uL — ABNORMAL LOW (ref 0.7–4.0)
MCH: 31.4 pg (ref 26.0–34.0)
MCHC: 35.9 g/dL (ref 30.0–36.0)
MCV: 87.5 fL (ref 80.0–100.0)
Monocytes Absolute: 1.3 10*3/uL — ABNORMAL HIGH (ref 0.1–1.0)
Monocytes Relative: 13 %
Neutro Abs: 8 10*3/uL — ABNORMAL HIGH (ref 1.7–7.7)
Neutrophils Relative %: 78 %
Platelets: 258 10*3/uL (ref 150–400)
RBC: 3.12 MIL/uL — ABNORMAL LOW (ref 4.22–5.81)
RDW: 23.2 % — ABNORMAL HIGH (ref 11.5–15.5)
WBC: 10.3 10*3/uL (ref 4.0–10.5)
nRBC: 0.5 % — ABNORMAL HIGH (ref 0.0–0.2)
nRBC: 2 /100 WBC — ABNORMAL HIGH

## 2020-05-18 NOTE — Progress Notes (Signed)
PROGRESS NOTE  John Watson  DOB: [birth year]  PCP: Kathyrn Lass ZOX:[id number]  DOA: 05/16/2020  LOS: 1 day   Chief Complaint  Patient presents with  . Abnormal Lab   Brief narrative: John Watson is a 59 y.o. male with PMH significant for recently diagnosed glioblastoma of frontal lobe (02/13/20), completed 6 weeks of radiation and oral antineoplastic angent, seizure disorder, prior CVA, hyperlipidemia. Patient was sent to ED on 12/17 by his oncologist [name] for abnormal labs.   On examination by the oncologist, he was found to have icteric sclera.  He endorsed abdominal distention and progressive generalized weakness for last several days. Labs showed markedly elevated LFT with total bilirubin more than 23.  CT abdomen and pelvis with contrast and abdominal ultrasound remarkable for calcified gallstones within decompressed gallbladder, no intra or extrahepatic biliary ductal dilatation, no calcified choledocholithiasis, mild hepatic steatosis, scattered subcentimeter hepatic lesions stable from CT scan done 4 months ago.   Acute hepatitis panel negative.   Patient admitted to hospitalist service.  Subjective: Patient was seen and examined this morning.  Middle-aged African-American male.  Lying on bed.  Not in distress.  No new symptoms. Chart reviewed.  Afebrile, hemodynamically stable.  Assessment/Plan: Elevated liver enzymes including bilirubin Elevated ferritin level -Liver enzyme trend as below. -Ferritin level over 5800.  Need to rule out hemochromatosis. -Statin remains on hold. -GI follow-up pending.  At this time condition does not need any endoscopy intervention. Recent Labs    05/17/20 0541 05/17/20 1101 05/18/20 0157  MCV 89.5 87.1 87.5  FERRITIN 5,807*  --   --    Recent Labs  Lab 05/16/20 1130 05/16/20 1621 05/17/20 0541 05/18/20 0157  AST 564* 488* 525* 421*  ALT 2,257* 1,826* 1,857* 1,613*  ALKPHOS 586* 432* 449* 402*  BILITOT 23.6* 20.5*  22.9* 19.0*  PROT 6.5 5.8* 6.1* 5.2*  ALBUMIN 2.6* 2.6* 2.6* 2.2*   Hypokalemia/hyperkalemia -Potassium level improved. Recent Labs  Lab 05/16/20 1130 05/16/20 1621 05/16/20 2200 05/17/20 0541 05/18/20 0157  K 3.4* 3.3*  --  5.5* 3.5  MG  --   --  1.9 2.2  --    UTI/cystitis, diagnosed and treated outpatient -Completed 5 days of po levaquin outpatient  Hyperlipidemia -Keep statin on hold because of elevated liver enzymes.  Recently diagnosed glioblastoma of frontal lobe -Diagnosed on February 13 2020 -Completed 6 weeks of radiation -Follows with [name] -Continue Decadron  Seizure disorder -Resume home Keppra -Seizure precautions  Hiccups -Controlled on as needed Thorazine and baclofen.  Mobility: Encourage ambulation Code Status:   Code Status: Full Code  Nutritional status: Body mass index is 26.17 kg/m.     Diet Order            Diet regular Room service appropriate? Yes; Fluid consistency: Thin  Diet effective now                 DVT prophylaxis: enoxaparin (LOVENOX) injection 40 mg Start: 05/16/20 2200   Antimicrobials:  None Fluid: None Consultants: GI Family Communication:  Not at bedside  Status is: Inpatient  Remains inpatient appropriate because: Pending further diagnostic work-up, liver enzymes remain elevated.   Dispo:  Patient From: Home  Planned Disposition: Home  Expected discharge date: Next 2 to 3 days  medically stable for discharge: No        Infusions:    Scheduled Meds: . dexamethasone  2 mg Oral Daily  . enoxaparin (LOVENOX) injection  40 mg Subcutaneous Q24H  .  levETIRAcetam  1,000 mg Oral BID  . multivitamin with minerals  1 tablet Oral Daily    Antimicrobials: Anti-infectives (From admission, onward)   None      PRN meds:    Objective: Vitals:   05/17/20 2114 05/18/20 0453  BP: 127/77 126/73  Pulse: 63 77  Resp: 19 18  Temp: 97.8 F (36.6 C) 97.7 F (36.5 C)  SpO2: 93% 97%     Intake/Output Summary (Last 24 hours) at 05/18/2020 0945 Last data filed at 05/18/2020 0600 Gross per 24 hour  Intake 1171.67 ml  Output 0 ml  Net 1171.67 ml   Filed Weights   05/16/20 1411 05/17/20 0824 05/17/20 2114  Weight: 77.1 kg 85.1 kg 85.1 kg   Weight change: 7.988 kg Body mass index is 26.17 kg/m.   Physical Exam: General exam: Pleasant, middle-aged African-American male.  Not in distress Skin: No rashes, lesions or ulcers. HEENT: Atraumatic, normocephalic, no obvious bleeding.  Icteric sclera continues Lungs: Clear to auscultation bilaterally CVS: Regular rate and rhythm, no murmur GI/Abd soft, nontender, nondistended, bowel sound present CNS: Alert, awake, oriented x3 Psychiatry: Mood appropriate Extremities: No pedal edema, no calf tenderness  Data Review: I have personally reviewed the laboratory data and studies available.  Recent Labs  Lab 05/16/20 1130 05/17/20 0541 05/17/20 1101 05/18/20 0157  WBC 13.4* 12.5* 10.4 10.3  NEUTROABS 8.9* 7.6  --  8.0*  HGB 11.5* 11.1* 11.3* 9.8*  HCT 32.5* 31.5* 30.4* 27.3*  MCV 89.3 89.5 87.1 87.5  PLT 285 284 281 258   Recent Labs  Lab 05/16/20 1130 05/16/20 1621 05/16/20 2200 05/17/20 0541 05/18/20 0157  NA 133* 134*  --  132* 135  K 3.4* 3.3*  --  5.5* 3.5  CL 101 98  --  102 104  CO2 23 19*  --  18* 22  GLUCOSE 124* 135*  --  95 113*  BUN 15 12  --  14 14  CREATININE 1.16 0.91  --  0.97 0.94  CALCIUM 9.3 8.9  --  8.6* 8.6*  MG  --   --  1.9 2.2  --     F/u labs ordered.  Signed, Terrilee Croak, MD [facility] 05/18/2020

## 2020-05-18 NOTE — Progress Notes (Signed)
UNASSIGNED PATIENT CROSS COVER [facility] Subjective: [name] is a 59 year old black male with recent diagnosis of glioblastoma of the frontal lobe on 02/13/2020 was completed a 6-week course of radiation and chemotherapy who presented to the hospital with elevated bilirubin levels up to 23 but patient denied having any nausea vomiting abdominal pain therefore he was hospitalized for further observation.  CT of the abdomen revealed calcified gallstones with a decompressed gallbladder but no evidence of biliary ductal dilation or choledocholithiasis.  Mild hepatic steatosis was noted and acute hepatitis serologies were negative.  Patient was noted to have an elevated ferritin and therefore evaluation for hemochromatosis was requested by Dr. Pietro Cassis..  Objective: Vital signs in last 24 hours: Temp:  [97.5 F (36.4 C)-98.4 F (36.9 C)] 97.5 F (36.4 C) (12/19 0954) Pulse Rate:  [63-77] 71 (12/19 0954) Resp:  [14-19] 18 (12/19 0954) BP: (111-135)/(70-87) 111/70 (12/19 0954) SpO2:  [93 %-97 %] 97 % (12/19 0954) Weight:  [85.1 kg] 85.1 kg (12/18 2114) Last BM Date: 05/17/20  Intake/Output from previous day: 12/18 0701 - 12/19 0700 In: 1171.7 [P.O.:580; I.V.:591.7] Out: 0  Intake/Output this shift: Total I/O In: 240 [P.O.:240] Out: 0   General appearance: alert, cooperative, appears stated age, icteric and no distress Resp: clear to auscultation bilaterally Cardio: regular rate and rhythm, S1, S2 normal, no murmur, click, rub or gallop GI: soft, non-tender; bowel sounds normal; no masses,  no organomegaly Extremities: extremities normal, atraumatic, no cyanosis or edema  Lab Results: Recent Labs    05/17/20 0541 05/17/20 1101 05/18/20 0157  WBC 12.5* 10.4 10.3  HGB 11.1* 11.3* 9.8*  HCT 31.5* 30.4* 27.3*  PLT 284 281 258   BMET Recent Labs    05/16/20 1621 05/17/20 0541 05/18/20 0157  NA 134* 132* 135  K 3.3* 5.5* 3.5  CL 98 102 104  CO2 19* 18* 22  GLUCOSE 135* 95  113*  BUN 12 14 14   CREATININE 0.91 0.97 0.94  CALCIUM 8.9 8.6* 8.6*   LFT Recent Labs    05/16/20 1621 05/17/20 0541 05/18/20 0157  PROT 5.8*   < > 5.2*  ALBUMIN 2.6*   < > 2.2*  AST 488*   < > 421*  ALT 1,826*   < > 1,613*  ALKPHOS 432*   < > 402*  BILITOT 20.5*   < > 19.0*  BILIDIR 13.8*  --   --    < > = values in this interval not displayed.   PT/INR Recent Labs    05/16/20 1621 05/17/20 0541  LABPROT 14.4 14.5  INR 1.2 1.2   Hepatitis Panel Recent Labs    05/16/20 1621  HEPBSAG NON REACTIVE  HCVAB NON REACTIVE  HEPAIGM NON REACTIVE  HEPBIGM NON REACTIVE   C-Diff No results for input(s): CDIFFTOX in the last 72 hours. No results for input(s): CDIFFPCR in the last 72 hours. Fecal Lactopherrin No results for input(s): FECLLACTOFRN in the last 72 hours.  Studies/Results: CT CHEST ABDOMEN PELVIS W CONTRAST  Result Date: 05/16/2020 CLINICAL DATA:  Abdominal mass, intra-abdominal neoplasm suspected hx glioblastoma, acute transaminitis with jaundice History of glioblastoma. EXAM: CT CHEST, ABDOMEN, AND PELVIS WITH CONTRAST TECHNIQUE: Multidetector CT imaging of the chest, abdomen and pelvis was performed following the standard protocol during bolus administration of intravenous contrast. CONTRAST:  145mL OMNIPAQUE IOHEXOL 300 MG/ML  SOLN COMPARISON:  Abdominal ultrasound earlier today. Chest abdomen pelvis CT 01/15/2020 FINDINGS: CT CHEST FINDINGS Cardiovascular: Mild aortic atherosclerosis. No aortic aneurysm or dissection. Central  pulmonary arteries are well opacified. Heart is normal in size. No pericardial effusion. Mediastinum/Nodes: No enlarged mediastinal or hilar lymph nodes. No axillary adenopathy. No thyroid nodule. No esophageal wall thickening. Lungs/Pleura: Minor dependent atelectasis in both lung bases. Additional linear subsegmental atelectasis in the right lower lobe. No confluent consolidation. There is no pulmonary mass. Tiny calcified nodule at the  right lung apex consistent with prior granulomatous disease. Stable tiny perifissural nodule in the right middle lobe, series 5, image 84. No new or progressive pulmonary nodule. Trachea and central bronchi are patent. No pleural fluid. No findings of pulmonary edema. Musculoskeletal: No acute osseous abnormality or focal bone lesion. Scattered Schmorl's nodes in the thoracic spine. CT ABDOMEN PELVIS FINDINGS Hepatobiliary: Mild hepatic steatosis. No significant change in the scattered subcentimeter hepatic hypodensities from prior exam. Largest lesion in the subcapsular right lobe measures 10 mm. No progression or new lesion. Calcified gallstones within decompressed gallbladder. No pericholecystic fat stranding. There is no intra or extrahepatic biliary ductal dilatation. No calcified choledocholithiasis. Pancreas: Motion artifact through the head and uncinate process of the pancreas. No evidence of focal or obstructing lesion. No ductal dilatation or inflammation. Spleen: Normal in size without focal abnormality. Adrenals/Urinary Tract: Normal adrenal glands without nodule. Motion artifact through the kidneys. No hydronephrosis or perinephric edema. Homogeneous renal enhancement with symmetric excretion on delayed phase imaging. Again seen 8 mm cyst in the lower left kidney. Urinary bladder is partially distended without wall thickening. Stomach/Bowel: Evaluation of bowel loops in the mid abdomen is limited given patient motion. Equivocal Peri pyloric gastric wall thickening, series 3, image 57. No proximal gastric obstruction or abnormal distention. No small bowel distension. No obvious bowel inflammation or evidence of bowel mass. Appendix not confidently visualized. Vascular/Lymphatic: Aortic atherosclerosis. No aortic aneurysm. Patent portal vein. Assessment for adenopathy is limited given motion. Allowing for this, no bulky abdominopelvic lymph nodes. Reproductive: Enlarged prostate gland spanning 5.4 cm  transverse. There is mild mass effect on the bladder base. Other: No ascites or free air. No evidence of intra-abdominal mass allowing for motion. Umbilical hernia on prior exam is obscured by motion. Musculoskeletal: No acute osseous abnormalities or focal osseous lesion. IMPRESSION: 1. No evidence of malignancy in the chest, abdomen, or pelvis allowing for patient motion. 2. Gallstones in contracted gallbladder. There is no biliary obstruction or abnormal distention. No explanation for jaundice. 3. Mild hepatic steatosis is well as scattered subcentimeter hepatic lesions stable from CT 4 months ago. 4. Equivocal peri pyloric gastric wall thickening, can be seen with gastritis. 5. Enlarged prostate gland. 6. Punctate perifissural nodule in the right middle lobe is unchanged. Aortic Atherosclerosis (ICD10-I70.0). Electronically Signed   By: Keith Rake M.D.   On: 05/16/2020 19:49   US Abdomen Limited RUQ (LIVER/GB)  Result Date: 05/16/2020 CLINICAL DATA:  Transaminitis. EXAM: ULTRASOUND ABDOMEN LIMITED RIGHT UPPER QUADRANT COMPARISON:  None. FINDINGS: Gallbladder: The gallbladder is contracted. Suggestion of a gallstone within the gallbladder lumen measuring up to 7 mm. No wall thickening visualized. No sonographic Murphy sign noted by sonographer. Common bile duct: Diameter: 3 mm Liver: No focal lesion identified. Within normal limits parenchymal echogenicity. Portal vein is patent on color Doppler imaging with normal direction of blood flow towards the liver. Other: Pancreas not visualized. IMPRESSION: 1. Limited evaluation due to contracted gallbladder and bowel gas. Pancreas not visualized. 2. Suggestion of cholelithiasis. Electronically Signed   By: Iven Finn M.D.   On: 05/16/2020 17:04   Medications: I have reviewed the patient's current  medications.  Assessment/Plan: 1) Hyperbilirubinemia thought to be secondary to the recent antineoplastic agent the patient may have consumed.  Will need  to be followed closely.  Patient sees [name] at the [facility] who can further evaluate his hyperferritinemia. 2) Asymptomatic cholelithiasis without choledocholithiasis. 3) Frontal lobe glioblastoma diagnosed on 02/13/2020 status post radiation chemotherapy for 6 weeks. 4) Seizure disorder on Keppra.  LOS: 1 day   Juanita Craver 05/18/2020, 10:40 AM

## 2020-05-19 ENCOUNTER — Other Ambulatory Visit: Payer: Self-pay | Admitting: Radiation Therapy

## 2020-05-19 LAB — COMPREHENSIVE METABOLIC PANEL
ALT: 1482 U/L — ABNORMAL HIGH (ref 0–44)
AST: 313 U/L — ABNORMAL HIGH (ref 15–41)
Albumin: 2.2 g/dL — ABNORMAL LOW (ref 3.5–5.0)
Alkaline Phosphatase: 392 U/L — ABNORMAL HIGH (ref 38–126)
Anion gap: 10 (ref 5–15)
BUN: 13 mg/dL (ref 6–20)
CO2: 20 mmol/L — ABNORMAL LOW (ref 22–32)
Calcium: 8.8 mg/dL — ABNORMAL LOW (ref 8.9–10.3)
Chloride: 101 mmol/L (ref 98–111)
Creatinine, Ser: 0.92 mg/dL (ref 0.61–1.24)
GFR, Estimated: >60 mL/min (ref >60)
Glucose, Bld: 107 mg/dL — ABNORMAL HIGH (ref 70–99)
Potassium: 3.2 mmol/L — ABNORMAL LOW (ref 3.5–5.1)
Sodium: 131 mmol/L — ABNORMAL LOW (ref 135–145)
Total Bilirubin: 18.5 mg/dL (ref 0.3–1.2)
Total Protein: 5.2 g/dL — ABNORMAL LOW (ref 6.5–8.1)

## 2020-05-19 LAB — CBC WITH DIFFERENTIAL/PLATELET
Abs Immature Granulocytes: 0 10*3/uL (ref 0.00–0.07)
Basophils Absolute: 0 10*3/uL (ref 0.0–0.1)
Basophils Relative: 0 %
Eosinophils Absolute: 0.1 10*3/uL (ref 0.0–0.5)
Eosinophils Relative: 1 %
HCT: 27.9 % — ABNORMAL LOW (ref 39.0–52.0)
Hemoglobin: 9.8 g/dL — ABNORMAL LOW (ref 13.0–17.0)
Lymphocytes Relative: 9 %
Lymphs Abs: 1 10*3/uL (ref 0.7–4.0)
MCH: 31.1 pg (ref 26.0–34.0)
MCHC: 35.1 g/dL (ref 30.0–36.0)
MCV: 88.6 fL (ref 80.0–100.0)
Monocytes Absolute: 1.9 10*3/uL — ABNORMAL HIGH (ref 0.1–1.0)
Monocytes Relative: 17 %
Neutro Abs: 8 10*3/uL — ABNORMAL HIGH (ref 1.7–7.7)
Neutrophils Relative %: 73 %
Platelets: 229 10*3/uL (ref 150–400)
RBC: 3.15 MIL/uL — ABNORMAL LOW (ref 4.22–5.81)
RDW: 23.9 % — ABNORMAL HIGH (ref 11.5–15.5)
WBC: 10.9 10*3/uL — ABNORMAL HIGH (ref 4.0–10.5)
nRBC: 0.5 % — ABNORMAL HIGH (ref 0.0–0.2)
nRBC: 2 /100 WBC — ABNORMAL HIGH

## 2020-05-19 MED ORDER — POTASSIUM CHLORIDE CRYS ER 20 MEQ PO TBCR
40.0000 meq | EXTENDED_RELEASE_TABLET | Freq: Once | ORAL | Status: AC
  Administered 2020-05-19: 40 meq via ORAL
  Filled 2020-05-19: qty 2

## 2020-05-19 NOTE — Plan of Care (Signed)
  Problem: Health Behavior/Discharge Planning: Goal: Ability to manage health-related needs will improve Outcome: Progressing   

## 2020-05-19 NOTE — Progress Notes (Signed)
PROGRESS NOTE    John Watson  WCH:[id number] DOB: Apr 11, 1961 DOA: 05/16/2020 PCP: Pcp, No   Brief Narrative: This 59 y.o. male with PMH significant for recently diagnosed glioblastoma of frontal lobe(02/13/20), completed 6 weeks of radiationand oral antineoplastic treatment , seizure disorder, prior CVA, hyperlipidemia. Patient was sent to ED on 12/17by his oncologist Dr. Helaine Chess abnormal labs.  On examination by the oncologist, he was found to have icteric sclera.  He endorsed abdominal distention and progressive generalized weakness for last several days. Labs showed markedly elevated LFT with total bilirubin more than 23. CT abdomen and pelvis with contrast and abdominal ultrasound remarkable for calcifiedgallstones within decompressed gallbladder, no intra or extrahepatic biliary ductal dilatation, no calcified choledocholithiasis, mild hepatic steatosis, scattered subcentimeter hepatic lesions stable from CT scan done 4 months ago.  Acute hepatitis panel negative.    Assessment & Plan:   Active Problems:   Jaundice  Elevated liver enzymes including bilirubin / Elevated ferritin level: -He was sent from oncologist office for elevated liver enzymes and bilirubin. -Trend liver enzymes. -Ferritin level over 5800.  Need to rule out hemochromatosis. -Statin remains on hold. -GI consulted for evaluation for hemochromatosis. -At this time condition does not need any endoscopy intervention. -GI elevated bilirubin could be from neoplastic medication recently used.   Lab Results  Component Value Date   FERRITIN 5,807 (H) 05/17/2020   Lab Results  Component Value Date   ALT 1,482 (H) 05/19/2020   AST 313 (H) 05/19/2020   ALKPHOS 392 (H) 05/19/2020   BILITOT 18.5 (HH) 05/19/2020    Hypokalemia : -potassium replacement    UTI/cystitis, diagnosed and treated outpatient. -Completed 5 days of po levaquin outpatient  Hyperlipidemia -Keep statin on hold because of  elevated liver enzymes.  Recently diagnosed glioblastoma of frontal lobe -Diagnosed on February 13 2020 -Completed 6 weeks of radiation -Follows with [name] -Continue Decadron  Seizure disorder : -Resume home [place]. -Seizure precautions.  Hiccups -Controlled on as needed Thorazine and baclofen.   DVT prophylaxis: Lovenox Code Status: Full code Family Communication: No family at bedside Disposition Plan:  Status is: Inpatient  Remains inpatient appropriate because:Inpatient level of care appropriate due to severity of illness   Dispo:  Patient From: Home  Planned Disposition: Home  Expected discharge date: In 1 to 2 days  Medically stable for discharge: No   Consultants:    GI  Procedures:   Antimicrobials: Anti-infectives (From admission, onward)   None        Subjective: Patient was seen and examined at bedside.  Overnight events noted.   Patient was sitting on the bed does not appear in any distress. His eyes are yellow, He remains afebrile,  hemodynamically stable. He denies any pain.    Objective: Vitals:   05/18/20 0954 05/18/20 2119 05/19/20 0428 05/19/20 0925  BP: 111/70 (!) 142/81 119/80 112/73  Pulse: 71 74 73 77  Resp: 18 16 17 18   Temp: (!) 97.5 F (36.4 C) 98 F (36.7 C) 98.4 F (36.9 C) 98.8 F (37.1 C)  TempSrc: Oral Oral Oral Oral  SpO2: 97% 100% 99% 99%  Weight:      Height:        Intake/Output Summary (Last 24 hours) at 05/19/2020 1454 Last data filed at 05/19/2020 3536 Gross per 24 hour  Intake 720 ml  Output 0 ml  Net 720 ml   Filed Weights   05/16/20 1411 05/17/20 0824 05/17/20 2114  Weight: 77.1 kg 85.1 kg 85.1 kg  Examination:  General exam: Appears calm and comfortable, eyes appears yellow. Respiratory system: Clear to auscultation. Respiratory effort normal. Cardiovascular system: S1 & S2 heard, RRR. No JVD, murmurs, rubs, gallops or clicks. No pedal edema. Gastrointestinal system: Abdomen is  nondistended, soft and nontender. No organomegaly or masses felt.  Normal bowel sounds heard. Central nervous system: Alert and oriented. No focal neurological deficits. Extremities: No edema, no cyanosis, no clubbing. Skin: No rashes, lesions or ulcers Psychiatry: Judgement and insight appear normal. Mood & affect appropriate.     Data Reviewed: I have personally reviewed following labs and imaging studies  CBC: Recent Labs  Lab 05/16/20 1130 05/17/20 0541 05/17/20 1101 05/18/20 0157 05/19/20 0433  WBC 13.4* 12.5* 10.4 10.3 10.9*  NEUTROABS 8.9* 7.6  --  8.0* 8.0*  HGB 11.5* 11.1* 11.3* 9.8* 9.8*  HCT 32.5* 31.5* 30.4* 27.3* 27.9*  MCV 89.3 89.5 87.1 87.5 88.6  PLT 285 284 281 258 381   Basic Metabolic Panel: Recent Labs  Lab 05/16/20 1130 05/16/20 1621 05/16/20 2200 05/17/20 0541 05/18/20 0157 05/19/20 0433  NA 133* 134*  --  132* 135 131*  K 3.4* 3.3*  --  5.5* 3.5 3.2*  CL 101 98  --  102 104 101  CO2 23 19*  --  18* 22 20*  GLUCOSE 124* 135*  --  95 113* 107*  BUN 15 12  --  14 14 13   CREATININE 1.16 0.91  --  0.97 0.94 0.92  CALCIUM 9.3 8.9  --  8.6* 8.6* 8.8*  MG  --   --  1.9 2.2  --   --    GFR: Estimated Creatinine Clearance: 92.1 mL/min (by C-G formula based on SCr of 0.92 mg/dL). Liver Function Tests: Recent Labs  Lab 05/16/20 1130 05/16/20 1621 05/17/20 0541 05/18/20 0157 05/19/20 0433  AST 564* 488* 525* 421* 313*  ALT 2,257* 1,826* 1,857* 1,613* 1,482*  ALKPHOS 586* 432* 449* 402* 392*  BILITOT 23.6* 20.5* 22.9* 19.0* 18.5*  PROT 6.5 5.8* 6.1* 5.2* 5.2*  ALBUMIN 2.6* 2.6* 2.6* 2.2* 2.2*   No results for input(s): LIPASE, AMYLASE in the last 168 hours. No results for input(s): AMMONIA in the last 168 hours. Coagulation Profile: Recent Labs  Lab 05/16/20 1621 05/17/20 0541  INR 1.2 1.2   Cardiac Enzymes: No results for input(s): CKTOTAL, CKMB, CKMBINDEX, TROPONINI in the last 168 hours. BNP (last 3 results) No results for  input(s): PROBNP in the last 8760 hours. HbA1C: No results for input(s): HGBA1C in the last 72 hours. CBG: No results for input(s): GLUCAP in the last 168 hours. Lipid Profile: No results for input(s): CHOL, HDL, LDLCALC, TRIG, CHOLHDL, LDLDIRECT in the last 72 hours. Thyroid Function Tests: No results for input(s): TSH, T4TOTAL, FREET4, T3FREE, THYROIDAB in the last 72 hours. Anemia Panel: Recent Labs    05/17/20 0541  FERRITIN 5,807*   Sepsis Labs: No results for input(s): PROCALCITON, LATICACIDVEN in the last 168 hours.  Recent Results (from the past 240 hour(s))  Resp Panel by RT-PCR (Flu A&B, Covid) Nasopharyngeal Swab     Status: None   Collection Time: 05/16/20  5:23 PM   Specimen: Nasopharyngeal Swab; Nasopharyngeal(NP) swabs in vial transport medium  Result Value Ref Range Status   SARS Coronavirus 2 by RT PCR NEGATIVE NEGATIVE Final    Comment: (NOTE) SARS-CoV-2 target nucleic acids are NOT DETECTED.  The SARS-CoV-2 RNA is generally detectable in upper respiratory specimens during the acute phase of infection. The lowest concentration  of SARS-CoV-2 viral copies this assay can detect is 138 copies/mL. A negative result does not preclude SARS-Cov-2 infection and should not be used as the sole basis for treatment or other patient management decisions. A negative result may occur with  improper specimen collection/handling, submission of specimen other than nasopharyngeal swab, presence of viral mutation(s) within the areas targeted by this assay, and inadequate number of viral copies(<138 copies/mL). A negative result must be combined with clinical observations, patient history, and epidemiological information. The expected result is Negative.  Fact Sheet for Patients:  EntrepreneurPulse.com.au  Fact Sheet for Healthcare Providers:  IncredibleEmployment.be  This test is no t yet approved or cleared by the [place] FDA and   has been authorized for detection and/or diagnosis of SARS-CoV-2 by FDA under an Emergency Use Authorization (EUA). This EUA will remain  in effect (meaning this test can be used) for the duration of the COVID-19 declaration under Section 564(b)(1) of the Act, 21 U.S.C.section 360bbb-3(b)(1), unless the authorization is terminated  or revoked sooner.       Influenza A by PCR NEGATIVE NEGATIVE Final   Influenza B by PCR NEGATIVE NEGATIVE Final    Comment: (NOTE) The Xpert Xpress SARS-CoV-2/FLU/RSV plus assay is intended as an aid in the diagnosis of influenza from Nasopharyngeal swab specimens and should not be used as a sole basis for treatment. Nasal washings and aspirates are unacceptable for Xpert Xpress SARS-CoV-2/FLU/RSV testing.  Fact Sheet for Patients: EntrepreneurPulse.com.au  Fact Sheet for Healthcare Providers: IncredibleEmployment.be  This test is not yet approved or cleared by the [place] FDA and has been authorized for detection and/or diagnosis of SARS-CoV-2 by FDA under an Emergency Use Authorization (EUA). This EUA will remain in effect (meaning this test can be used) for the duration of the COVID-19 declaration under Section 564(b)(1) of the Act, 21 U.S.C. section 360bbb-3(b)(1), unless the authorization is terminated or revoked.  Performed at [facility], [place] [address] [place], [place] [address]     Radiology Studies: No results found.  Scheduled Meds: . dexamethasone  2 mg Oral Daily  . enoxaparin (LOVENOX) injection  40 mg Subcutaneous Q24H  . levETIRAcetam  1,000 mg Oral BID  . multivitamin with minerals  1 tablet Oral Daily   Continuous Infusions:   LOS: 2 days    Time spent: 25 mins    Shawna Clamp, MD [facility]   If 7PM-7AM, please contact night-coverage

## 2020-05-20 LAB — CBC
HCT: 27.4 % — ABNORMAL LOW (ref 39.0–52.0)
Hemoglobin: 10.2 g/dL — ABNORMAL LOW (ref 13.0–17.0)
MCH: 32.6 pg (ref 26.0–34.0)
MCHC: 37.2 g/dL — ABNORMAL HIGH (ref 30.0–36.0)
MCV: 87.5 fL (ref 80.0–100.0)
Platelets: 232 10*3/uL (ref 150–400)
RBC: 3.13 MIL/uL — ABNORMAL LOW (ref 4.22–5.81)
RDW: 23.8 % — ABNORMAL HIGH (ref 11.5–15.5)
WBC: 10.9 10*3/uL — ABNORMAL HIGH (ref 4.0–10.5)
nRBC: 0.3 % — ABNORMAL HIGH (ref 0.0–0.2)

## 2020-05-20 LAB — COMPREHENSIVE METABOLIC PANEL
ALT: 1230 U/L — ABNORMAL HIGH (ref 0–44)
AST: 225 U/L — ABNORMAL HIGH (ref 15–41)
Albumin: 2.2 g/dL — ABNORMAL LOW (ref 3.5–5.0)
Alkaline Phosphatase: 371 U/L — ABNORMAL HIGH (ref 38–126)
Anion gap: 9 (ref 5–15)
BUN: 14 mg/dL (ref 6–20)
CO2: 22 mmol/L (ref 22–32)
Calcium: 8.7 mg/dL — ABNORMAL LOW (ref 8.9–10.3)
Chloride: 101 mmol/L (ref 98–111)
Creatinine, Ser: 1 mg/dL (ref 0.61–1.24)
GFR, Estimated: >60 mL/min (ref >60)
Glucose, Bld: 126 mg/dL — ABNORMAL HIGH (ref 70–99)
Potassium: 3.4 mmol/L — ABNORMAL LOW (ref 3.5–5.1)
Sodium: 132 mmol/L — ABNORMAL LOW (ref 135–145)
Total Bilirubin: 17.7 mg/dL — ABNORMAL HIGH (ref 0.3–1.2)
Total Protein: 5.2 g/dL — ABNORMAL LOW (ref 6.5–8.1)

## 2020-05-20 LAB — MAGNESIUM: Magnesium: 2 mg/dL (ref 1.7–2.4)

## 2020-05-20 LAB — PHOSPHORUS: Phosphorus: 4.3 mg/dL (ref 2.5–4.6)

## 2020-05-20 MED ORDER — POTASSIUM CHLORIDE CRYS ER 20 MEQ PO TBCR
40.0000 meq | EXTENDED_RELEASE_TABLET | Freq: Once | ORAL | Status: AC
  Administered 2020-05-20: 40 meq via ORAL
  Filled 2020-05-20: qty 2

## 2020-05-20 NOTE — Discharge Instructions (Signed)
Advised to follow up PCP in one week. Advised to follow up [name] as scheduled. Advised to follow up GI Dr. Collene Mares as scheduled.

## 2020-05-20 NOTE — TOC Transition Note (Signed)
Transition of Care [facility]) - CM/SW Discharge Note   Patient Details  Name: John Watson MRN: [id number] Date of Birth: [birth year]  Transition of Care [facility]) CM/SW Contact:  Bartholomew Crews, RN Phone Number: [phone] 05/20/2020, 10:46 AM   Clinical Narrative:     Spoke with patient at the bedside about transition home today. Patient already connected with oncology case management team. Medicaid pending. Stated that he has follow up appointments scheduled. No TOC needs identified.   Final next level of care: Home/Self Care Barriers to Discharge: No Barriers Identified   Patient Goals and CMS Choice        Discharge Placement                       Discharge Plan and Services                                     Social Determinants of Health (SDOH) Interventions     Readmission Risk Interventions No flowsheet data found.

## 2020-05-20 NOTE — Plan of Care (Signed)
  Problem: Elimination: Goal: Will not experience complications related to bowel motility Outcome: Progressing   

## 2020-05-20 NOTE — Discharge Summary (Addendum)
Physician Discharge Summary  John Watson WCH:[id number] DOB: Jul 18, 1960 DOA: 05/16/2020  PCP: Merryl Hacker, No  Admit date: 05/16/2020   Discharge date: 05/20/2020  Admitted From: Home. Disposition:  Home.  Recommendations for Outpatient Follow-up:  1. Follow up with PCP in 1-2 weeks. 2. Please obtain BMP/CBC in one week. 3. Advised to follow up [name] as scheduled. 4. Advised to continue Decadron as prescribed by [name]. 5. Advised to follow up GI Dr. Collene Mares as scheduled.  Home Health: None.  Equipment/Devices: None.  Discharge Condition: Stable CODE STATUS:Full code Diet recommendation: Heart Healthy   Brief Summary / Hospital course: This 59 y.o.malewith PMH significant for recently diagnosed glioblastoma of frontal lobe(02/13/20), , He has completed 6 weeks of radiationand oral antineoplastic treatment , hx. of seizure disorder, prior CVA, hyperlipidemia. Patient was sent to ED on 12/17by his oncologist Dr. Helaine Chess abnormal labs.  On examination by the oncologist, he was found to have icteric sclera. He endorsed abdominal distention and progressive generalized weakness for last several days. Labs showed markedly elevated LFT with total bilirubin more than 23. CT abdomen and pelvis with contrast and abdominal ultrasound remarkable for calcifiedgallstones within decompressed gallbladder, no intra or extrahepatic biliary ductal dilatation, no calcified choledocholithiasis, mild hepatic steatosis, scattered subcentimeter hepatic lesions stable from CT scan done 4 months ago.  Acute hepatitis panel negative.  GI consulted, recommended close follow-up.  Hyperbilirubinemia thought to be secondary to the recent antineoplastic agent.  Patient has asymptomatic cholelithiasis without choledocholithiasis.  Patient's liver enzymes and total bilirubin trending down.  GI cleared the patient for discharge.  At this time patient does not need any endoscopic evaluation.  Will follow up  with GI outpatient for close follow-up.  Patient denies any abdominal pain, nausea vomiting.  Patient is being discharged home.  He was managed for below problems.   Discharge Diagnoses:  Active Problems:   Jaundice  Elevated liver enzymes including bilirubin / Elevated ferritin level: -He was sent from oncologist office for elevated liver enzymes and bilirubin. -Liver enzymes and total bilirubin trending down. -Ferritin level over 5800. Need to rule out hemochromatosis. -Statin remains on hold. -GI consulted for evaluation for hemochromatosis. -At this time condition does not need any endoscopy intervention. -GI : Elevated bilirubin could be from neoplastic medication recently used.   Recent Labs       Lab Results  Component Value Date   FERRITIN 5,807 (H) 05/17/2020     Recent Labs       Lab Results  Component Value Date   ALT 1,482 (H) 05/19/2020   AST 313 (H) 05/19/2020   ALKPHOS 392 (H) 05/19/2020   BILITOT 18.5 (HH) 05/19/2020      Hypokalemia : Improved. -potassium replacement,   UTI/cystitis, diagnosed and treated outpatient. -Completed 5 days of po levaquin outpatient.  Hyperlipidemia -Keep statin on hold because of elevated liver enzymes.  Recently diagnosed glioblastoma of frontal lobe -Diagnosed on February 13 2020 -Completed 6 weeks of radiation -Follows with [name] -Continue Decadron  Seizure disorder : -Resume home [place]. -Seizure precautions.  Hiccups -Controlled on as needed Thorazine and baclofen.   Discharge Instructions  Discharge Instructions    Call MD for:  difficulty breathing, headache or visual disturbances   Complete by: As directed    Call MD for:  persistant dizziness or light-headedness   Complete by: As directed    Call MD for:  persistant nausea and vomiting   Complete by: As directed    Call MD for:  temperature >100.4   Complete by: As directed    Diet - low sodium heart healthy    Complete by: As directed    Diet Carb Modified   Complete by: As directed    Discharge instructions   Complete by: As directed    Advised to follow up PCP in one week. Advised to follow up [name] as scheduled. Advised to follow up GI Dr. Collene Mares as scheduled.   Increase activity slowly   Complete by: As directed      Allergies as of 05/20/2020      Reactions   Vicodin [hydrocodone-acetaminophen] Anaphylaxis      Medication List    STOP taking these medications   atorvastatin 80 MG tablet Commonly known as: LIPITOR   levofloxacin 250 MG tablet Commonly known as: LEVAQUIN   temozolomide 140 MG capsule Commonly known as: TEMODAR     TAKE these medications   baclofen 10 MG tablet Commonly known as: LIORESAL Take 1 tablet (10 mg total) by mouth 3 (three) times daily as needed for muscle spasms. What changed: reasons to take this   chlorproMAZINE 25 MG tablet Commonly known as: THORAZINE Take 25 mg by mouth 3 (three) times daily as needed for hiccoughs.   dexamethasone 2 MG tablet Commonly known as: DECADRON Take 1 tablet (2 mg total) by mouth daily.   Gas Relief Extra Strength 125 MG chewable tablet Generic drug: simethicone Chew 125 mg by mouth every 6 (six) hours as needed for flatulence.   Phazyme 125 MG chewable tablet Generic drug: simethicone Chew 125 mg by mouth every 6 (six) hours as needed for flatulence.   levETIRAcetam 1000 MG tablet Commonly known as: KEPPRA Take 1 tablet (1,000 mg total) by mouth 2 (two) times daily.   ondansetron 8 MG tablet Commonly known as: Zofran Take 1 tablet (8 mg total) by mouth 2 (two) times daily as needed (nausea and vomiting). May take 30-60 minutes prior to Temodar administration if nausea/vomiting occurs. What changed: additional instructions       Follow-up Information    Ventura Sellers, MD Follow up in 59 week(s).   Specialties: Psychiatry, Neurology, Oncology Contact information: [place] [address] [phone]        Juanita Craver, MD Follow up in 59 week(s).   Specialty: Gastroenterology Contact information: [address] [place] [address] [phone]              Allergies  Allergen Reactions  . Vicodin [Hydrocodone-Acetaminophen] Anaphylaxis    Consultations:  Gastroenterology   Procedures/Studies: CT CHEST ABDOMEN PELVIS W CONTRAST  Result Date: 05/16/2020 CLINICAL DATA:  Abdominal mass, intra-abdominal neoplasm suspected hx glioblastoma, acute transaminitis with jaundice History of glioblastoma. EXAM: CT CHEST, ABDOMEN, AND PELVIS WITH CONTRAST TECHNIQUE: Multidetector CT imaging of the chest, abdomen and pelvis was performed following the standard protocol during bolus administration of intravenous contrast. CONTRAST:  150mL OMNIPAQUE IOHEXOL 300 MG/ML  SOLN COMPARISON:  Abdominal ultrasound earlier today. Chest abdomen pelvis CT 01/15/2020 FINDINGS: CT CHEST FINDINGS Cardiovascular: Mild aortic atherosclerosis. No aortic aneurysm or dissection. Central pulmonary arteries are well opacified. Heart is normal in size. No pericardial effusion. Mediastinum/Nodes: No enlarged mediastinal or hilar lymph nodes. No axillary adenopathy. No thyroid nodule. No esophageal wall thickening. Lungs/Pleura: Minor dependent atelectasis in both lung bases. Additional linear subsegmental atelectasis in the right lower lobe. No confluent consolidation. There is no pulmonary mass. Tiny calcified nodule at the right lung apex consistent  with prior granulomatous disease. Stable tiny perifissural nodule in the right middle lobe, series 5, image 84. No new or progressive pulmonary nodule. Trachea and central bronchi are patent. No pleural fluid. No findings of pulmonary edema. Musculoskeletal: No acute osseous abnormality or focal bone lesion. Scattered Schmorl's nodes in the thoracic spine. CT ABDOMEN PELVIS FINDINGS Hepatobiliary: Mild hepatic  steatosis. No significant change in the scattered subcentimeter hepatic hypodensities from prior exam. Largest lesion in the subcapsular right lobe measures 10 mm. No progression or new lesion. Calcified gallstones within decompressed gallbladder. No pericholecystic fat stranding. There is no intra or extrahepatic biliary ductal dilatation. No calcified choledocholithiasis. Pancreas: Motion artifact through the head and uncinate process of the pancreas. No evidence of focal or obstructing lesion. No ductal dilatation or inflammation. Spleen: Normal in size without focal abnormality. Adrenals/Urinary Tract: Normal adrenal glands without nodule. Motion artifact through the kidneys. No hydronephrosis or perinephric edema. Homogeneous renal enhancement with symmetric excretion on delayed phase imaging. Again seen 8 mm cyst in the lower left kidney. Urinary bladder is partially distended without wall thickening. Stomach/Bowel: Evaluation of bowel loops in the mid abdomen is limited given patient motion. Equivocal Peri pyloric gastric wall thickening, series 3, image 57. No proximal gastric obstruction or abnormal distention. No small bowel distension. No obvious bowel inflammation or evidence of bowel mass. Appendix not confidently visualized. Vascular/Lymphatic: Aortic atherosclerosis. No aortic aneurysm. Patent portal vein. Assessment for adenopathy is limited given motion. Allowing for this, no bulky abdominopelvic lymph nodes. Reproductive: Enlarged prostate gland spanning 5.4 cm transverse. There is mild mass effect on the bladder base. Other: No ascites or free air. No evidence of intra-abdominal mass allowing for motion. Umbilical hernia on prior exam is obscured by motion. Musculoskeletal: No acute osseous abnormalities or focal osseous lesion. IMPRESSION: 1. No evidence of malignancy in the chest, abdomen, or pelvis allowing for patient motion. 2. Gallstones in contracted gallbladder. There is no biliary  obstruction or abnormal distention. No explanation for jaundice. 3. Mild hepatic steatosis is well as scattered subcentimeter hepatic lesions stable from CT 4 months ago. 4. Equivocal peri pyloric gastric wall thickening, can be seen with gastritis. 5. Enlarged prostate gland. 6. Punctate perifissural nodule in the right middle lobe is unchanged. Aortic Atherosclerosis (ICD10-I70.0). Electronically Signed   By: Keith Rake M.D.   On: 05/16/2020 19:49   CUP PACEART REMOTE DEVICE CHECK  Result Date: 04/27/2020 ILR summary report received. Battery status OK. Normal device function. No new symptom, tachy, brady, or pause episodes. No new AF episodes. Monthly summary reports and ROV/PRN.  RPrince Rome, LPN, CVRS  US Abdomen Limited RUQ (LIVER/GB)  Result Date: 05/16/2020 CLINICAL DATA:  Transaminitis. EXAM: ULTRASOUND ABDOMEN LIMITED RIGHT UPPER QUADRANT COMPARISON:  None. FINDINGS: Gallbladder: The gallbladder is contracted. Suggestion of a gallstone within the gallbladder lumen measuring up to 7 mm. No wall thickening visualized. No sonographic Murphy sign noted by sonographer. Common bile duct: Diameter: 3 mm Liver: No focal lesion identified. Within normal limits parenchymal echogenicity. Portal vein is patent on color Doppler imaging with normal direction of blood flow towards the liver. Other: Pancreas not visualized. IMPRESSION: 1. Limited evaluation due to contracted gallbladder and bowel gas. Pancreas not visualized. 2. Suggestion of cholelithiasis. Electronically Signed   By: Iven Finn M.D.   On: 05/16/2020 17:04     Subjective: Patient was seen and examined at bedside.  Overnight events noted.  Patient reports feeling better,  denies any nausea, vomiting and abdominal pain.  Cleared from  GI to be discharged.  Patient wants to be discharged home.  Discharge Exam: Vitals:   05/20/20 0524 05/20/20 0909  BP: 113/82 109/78  Pulse: 71 78  Resp: 16 20  Temp: 98.9 F (37.2 C) 97.6 F  (36.4 C)  SpO2: 98% 98%   Vitals:   05/19/20 1730 05/19/20 2134 05/20/20 0524 05/20/20 0909  BP: 116/70 117/84 113/82 109/78  Pulse: 74 67 71 78  Resp: 18 16 16 20   Temp: 98.8 F (37.1 C) 98.2 F (36.8 C) 98.9 F (37.2 C) 97.6 F (36.4 C)  TempSrc: Oral Oral Oral Oral  SpO2: 98% 96% 98% 98%  Weight:  81.8 kg    Height:        General: Pt is alert, awake, not in acute distress Cardiovascular: RRR, S1/S2 +, no rubs, no gallops Respiratory: CTA bilaterally, no wheezing, no rhonchi Abdominal: Soft, NT, ND, bowel sounds + Extremities: no edema, no cyanosis    The results of significant diagnostics from this hospitalization (including imaging, microbiology, ancillary and laboratory) are listed below for reference.     Microbiology: Recent Results (from the past 240 hour(s))  Resp Panel by RT-PCR (Flu A&B, Covid) Nasopharyngeal Swab     Status: None   Collection Time: 05/16/20  5:23 PM   Specimen: Nasopharyngeal Swab; Nasopharyngeal(NP) swabs in vial transport medium  Result Value Ref Range Status   SARS Coronavirus 2 by RT PCR NEGATIVE NEGATIVE Final    Comment: (NOTE) SARS-CoV-2 target nucleic acids are NOT DETECTED.  The SARS-CoV-2 RNA is generally detectable in upper respiratory specimens during the acute phase of infection. The lowest concentration of SARS-CoV-2 viral copies this assay can detect is 138 copies/mL. A negative result does not preclude SARS-Cov-2 infection and should not be used as the sole basis for treatment or other patient management decisions. A negative result may occur with  improper specimen collection/handling, submission of specimen other than nasopharyngeal swab, presence of viral mutation(s) within the areas targeted by this assay, and inadequate number of viral copies(<138 copies/mL). A negative result must be combined with clinical observations, patient history, and epidemiological information. The expected result is Negative.  Fact  Sheet for Patients:  EntrepreneurPulse.com.au  Fact Sheet for Healthcare Providers:  IncredibleEmployment.be  This test is no t yet approved or cleared by the [place] FDA and  has been authorized for detection and/or diagnosis of SARS-CoV-2 by FDA under an Emergency Use Authorization (EUA). This EUA will remain  in effect (meaning this test can be used) for the duration of the COVID-19 declaration under Section 564(b)(1) of the Act, 21 U.S.C.section 360bbb-3(b)(1), unless the authorization is terminated  or revoked sooner.       Influenza A by PCR NEGATIVE NEGATIVE Final   Influenza B by PCR NEGATIVE NEGATIVE Final    Comment: (NOTE) The Xpert Xpress SARS-CoV-2/FLU/RSV plus assay is intended as an aid in the diagnosis of influenza from Nasopharyngeal swab specimens and should not be used as a sole basis for treatment. Nasal washings and aspirates are unacceptable for Xpert Xpress SARS-CoV-2/FLU/RSV testing.  Fact Sheet for Patients: EntrepreneurPulse.com.au  Fact Sheet for Healthcare Providers: IncredibleEmployment.be  This test is not yet approved or cleared by the [place] FDA and has been authorized for detection and/or diagnosis of SARS-CoV-2 by FDA under an Emergency Use Authorization (EUA). This EUA will remain in effect (meaning this test can be used) for the duration of the COVID-19 declaration under Section 564(b)(1) of the Act, 21 U.S.C. section 360bbb-3(b)(1), unless  the authorization is terminated or revoked.  Performed at [facility], [place] [address], [place], [place] [address]      Labs: BNP (last 3 results) No results for input(s): BNP in the last 8760 hours. Basic Metabolic Panel: Recent Labs  Lab 05/16/20 1621 05/16/20 2200 05/17/20 0541 05/18/20 0157 05/19/20 0433 05/20/20 0340  NA 134*  --  132* 135 131* 132*  K 3.3*  --  5.5* 3.5 3.2* 3.4*  CL 98  --   102 104 101 101  CO2 19*  --  18* 22 20* 22  GLUCOSE 135*  --  95 113* 107* 126*  BUN 12  --  14 14 13 14   CREATININE 0.91  --  0.97 0.94 0.92 1.00  CALCIUM 8.9  --  8.6* 8.6* 8.8* 8.7*  MG  --  1.9 2.2  --   --  2.0  PHOS  --   --   --   --   --  4.3   Liver Function Tests: Recent Labs  Lab 05/16/20 1621 05/17/20 0541 05/18/20 0157 05/19/20 0433 05/20/20 0340  AST 488* 525* 421* 313* 225*  ALT 1,826* 1,857* 1,613* 1,482* 1,230*  ALKPHOS 432* 449* 402* 392* 371*  BILITOT 20.5* 22.9* 19.0* 18.5* 17.7*  PROT 5.8* 6.1* 5.2* 5.2* 5.2*  ALBUMIN 2.6* 2.6* 2.2* 2.2* 2.2*   No results for input(s): LIPASE, AMYLASE in the last 168 hours. No results for input(s): AMMONIA in the last 168 hours. CBC: Recent Labs  Lab 05/16/20 1130 05/17/20 0541 05/17/20 1101 05/18/20 0157 05/19/20 0433 05/20/20 0340  WBC 13.4* 12.5* 10.4 10.3 10.9* 10.9*  NEUTROABS 8.9* 7.6  --  8.0* 8.0*  --   HGB 11.5* 11.1* 11.3* 9.8* 9.8* 10.2*  HCT 32.5* 31.5* 30.4* 27.3* 27.9* 27.4*  MCV 89.3 89.5 87.1 87.5 88.6 87.5  PLT 285 284 281 258 229 232   Cardiac Enzymes: No results for input(s): CKTOTAL, CKMB, CKMBINDEX, TROPONINI in the last 168 hours. BNP: Invalid input(s): POCBNP CBG: No results for input(s): GLUCAP in the last 168 hours. D-Dimer No results for input(s): DDIMER in the last 72 hours. Hgb A1c No results for input(s): HGBA1C in the last 72 hours. Lipid Profile No results for input(s): CHOL, HDL, LDLCALC, TRIG, CHOLHDL, LDLDIRECT in the last 72 hours. Thyroid function studies No results for input(s): TSH, T4TOTAL, T3FREE, THYROIDAB in the last 72 hours.  Invalid input(s): FREET3 Anemia work up No results for input(s): VITAMINB12, FOLATE, FERRITIN, TIBC, IRON, RETICCTPCT in the last 72 hours. Urinalysis    Component Value Date/Time   COLORURINE AMBER (A) 05/16/2020 1140   APPEARANCEUR CLEAR 05/16/2020 1140   LABSPEC 1.020 05/16/2020 1140   PHURINE 5.0 05/16/2020 1140   GLUCOSEU  NEGATIVE 05/16/2020 1140   HGBUR NEGATIVE 05/16/2020 1140   BILIRUBINUR MODERATE (A) 05/16/2020 1140   KETONESUR NEGATIVE 05/16/2020 1140   PROTEINUR 30 (A) 05/16/2020 1140   NITRITE NEGATIVE 05/16/2020 1140   LEUKOCYTESUR NEGATIVE 05/16/2020 1140   Sepsis Labs Invalid input(s): PROCALCITONIN,  WBC,  LACTICIDVEN Microbiology Recent Results (from the past 240 hour(s))  Resp Panel by RT-PCR (Flu A&B, Covid) Nasopharyngeal Swab     Status: None   Collection Time: 05/16/20  5:23 PM   Specimen: Nasopharyngeal Swab; Nasopharyngeal(NP) swabs in vial transport medium  Result Value Ref Range Status   SARS Coronavirus 2 by RT PCR NEGATIVE NEGATIVE Final    Comment: (NOTE) SARS-CoV-2 target nucleic acids are NOT DETECTED.  The SARS-CoV-2 RNA is  generally detectable in upper respiratory specimens during the acute phase of infection. The lowest concentration of SARS-CoV-2 viral copies this assay can detect is 138 copies/mL. A negative result does not preclude SARS-Cov-2 infection and should not be used as the sole basis for treatment or other patient management decisions. A negative result may occur with  improper specimen collection/handling, submission of specimen other than nasopharyngeal swab, presence of viral mutation(s) within the areas targeted by this assay, and inadequate number of viral copies(<138 copies/mL). A negative result must be combined with clinical observations, patient history, and epidemiological information. The expected result is Negative.  Fact Sheet for Patients:  EntrepreneurPulse.com.au  Fact Sheet for Healthcare Providers:  IncredibleEmployment.be  This test is no t yet approved or cleared by the [place] FDA and  has been authorized for detection and/or diagnosis of SARS-CoV-2 by FDA under an Emergency Use Authorization (EUA). This EUA will remain  in effect (meaning this test can be used) for the duration of  the COVID-19 declaration under Section 564(b)(1) of the Act, 21 U.S.C.section 360bbb-3(b)(1), unless the authorization is terminated  or revoked sooner.       Influenza A by PCR NEGATIVE NEGATIVE Final   Influenza B by PCR NEGATIVE NEGATIVE Final    Comment: (NOTE) The Xpert Xpress SARS-CoV-2/FLU/RSV plus assay is intended as an aid in the diagnosis of influenza from Nasopharyngeal swab specimens and should not be used as a sole basis for treatment. Nasal washings and aspirates are unacceptable for Xpert Xpress SARS-CoV-2/FLU/RSV testing.  Fact Sheet for Patients: EntrepreneurPulse.com.au  Fact Sheet for Healthcare Providers: IncredibleEmployment.be  This test is not yet approved or cleared by the [place] FDA and has been authorized for detection and/or diagnosis of SARS-CoV-2 by FDA under an Emergency Use Authorization (EUA). This EUA will remain in effect (meaning this test can be used) for the duration of the COVID-19 declaration under Section 564(b)(1) of the Act, 21 U.S.C. section 360bbb-3(b)(1), unless the authorization is terminated or revoked.  Performed at [facility], [place] [address], [place], [place] [address]      Time coordinating discharge: Over 30 minutes  SIGNED:   Shawna Clamp, MD  [facility] 05/20/2020, 10:19 AM Pager   If 7PM-7AM, please contact night-coverage [email or web address]

## 2020-05-20 NOTE — Progress Notes (Signed)
DISCHARGE NOTE HOME YOUCEF KLAS to be discharged to home per MD order. Discussed prescriptions and follow up appointments with the patient. Prescriptions given to patient; medication list explained in detail. Patient verbalized understanding.  Skin clean, dry and intact without evidence of skin break down, no evidence of skin tears noted. IV catheter discontinued intact. Site without signs and symptoms of complications. Dressing and pressure applied. Pt denies pain at the site currently. No complaints noted.  Patient free of lines, drains, and wounds.   An After Visit Summary (AVS) was printed and given to the patient. Patient escorted via wheelchair, and discharged home via private auto.  Hugo, Zenon Mayo, RN

## 2020-05-21 ENCOUNTER — Encounter: Payer: Self-pay | Admitting: Internal Medicine

## 2020-05-29 ENCOUNTER — Other Ambulatory Visit: Payer: Self-pay

## 2020-05-29 ENCOUNTER — Ambulatory Visit (HOSPITAL_COMMUNITY)
Admission: RE | Admit: 2020-05-29 | Discharge: 2020-05-29 | Disposition: A | Discharge status: Home / Self Care | Payer: Self-pay | Source: Ambulatory Visit | Attending: Internal Medicine | Admitting: Internal Medicine

## 2020-05-29 DIAGNOSIS — C711 Malignant neoplasm of frontal lobe: Secondary | ICD-10-CM | POA: Insufficient documentation

## 2020-05-29 MED ORDER — GADOBUTROL 1 MMOL/ML IV SOLN
8.0000 mL | Freq: Once | INTRAVENOUS | Status: AC | PRN
  Administered 2020-05-29: 8 mL via INTRAVENOUS

## 2020-06-02 ENCOUNTER — Ambulatory Visit (INDEPENDENT_AMBULATORY_CARE_PROVIDER_SITE_OTHER): Payer: Self-pay

## 2020-06-02 ENCOUNTER — Inpatient Hospital Stay: Payer: No Typology Code available for payment source | Admitting: Internal Medicine

## 2020-06-02 ENCOUNTER — Encounter: Payer: Self-pay | Admitting: Internal Medicine

## 2020-06-02 DIAGNOSIS — I639 Cerebral infarction, unspecified: Secondary | ICD-10-CM

## 2020-06-03 LAB — CUP PACEART REMOTE DEVICE CHECK
Date Time Interrogation Session: 20211230225935
Implantable Pulse Generator Implant Date: 20210819

## 2020-06-05 ENCOUNTER — Other Ambulatory Visit: Payer: Self-pay | Admitting: *Deleted

## 2020-06-05 ENCOUNTER — Inpatient Hospital Stay (HOSPITAL_BASED_OUTPATIENT_CLINIC_OR_DEPARTMENT_OTHER): Payer: No Typology Code available for payment source | Admitting: Internal Medicine

## 2020-06-05 ENCOUNTER — Encounter: Payer: Self-pay | Admitting: Gastroenterology

## 2020-06-05 ENCOUNTER — Encounter: Payer: Self-pay | Admitting: Internal Medicine

## 2020-06-05 ENCOUNTER — Other Ambulatory Visit: Payer: Self-pay

## 2020-06-05 ENCOUNTER — Inpatient Hospital Stay: Payer: No Typology Code available for payment source | Admitting: Internal Medicine

## 2020-06-05 ENCOUNTER — Inpatient Hospital Stay: Payer: No Typology Code available for payment source | Attending: Neurological Surgery

## 2020-06-05 VITALS — BP 135/81 | HR 86 | Temp 97.4°F | Resp 18 | Ht 71.0 in | Wt 176.4 lb

## 2020-06-05 DIAGNOSIS — Z79899 Other long term (current) drug therapy: Secondary | ICD-10-CM | POA: Insufficient documentation

## 2020-06-05 DIAGNOSIS — C711 Malignant neoplasm of frontal lobe: Secondary | ICD-10-CM

## 2020-06-05 DIAGNOSIS — R17 Unspecified jaundice: Secondary | ICD-10-CM

## 2020-06-05 DIAGNOSIS — K7689 Other specified diseases of liver: Secondary | ICD-10-CM | POA: Diagnosis not present

## 2020-06-05 DIAGNOSIS — Z8673 Personal history of transient ischemic attack (TIA), and cerebral infarction without residual deficits: Secondary | ICD-10-CM | POA: Insufficient documentation

## 2020-06-05 DIAGNOSIS — R531 Weakness: Secondary | ICD-10-CM | POA: Insufficient documentation

## 2020-06-05 DIAGNOSIS — R569 Unspecified convulsions: Secondary | ICD-10-CM

## 2020-06-05 DIAGNOSIS — Z87891 Personal history of nicotine dependence: Secondary | ICD-10-CM | POA: Insufficient documentation

## 2020-06-05 DIAGNOSIS — Z7952 Long term (current) use of systemic steroids: Secondary | ICD-10-CM | POA: Insufficient documentation

## 2020-06-05 LAB — CBC WITH DIFFERENTIAL (CANCER CENTER ONLY)
Abs Immature Granulocytes: 0.77 10*3/uL — ABNORMAL HIGH (ref 0.00–0.07)
Basophils Absolute: 0 10*3/uL (ref 0.0–0.1)
Basophils Relative: 0 %
Eosinophils Absolute: 0.1 10*3/uL (ref 0.0–0.5)
Eosinophils Relative: 1 %
HCT: 33.7 % — ABNORMAL LOW (ref 39.0–52.0)
Hemoglobin: 11.3 g/dL — ABNORMAL LOW (ref 13.0–17.0)
Immature Granulocytes: 8 %
Lymphocytes Relative: 6 %
Lymphs Abs: 0.6 10*3/uL — ABNORMAL LOW (ref 0.7–4.0)
MCH: 31.7 pg (ref 26.0–34.0)
MCHC: 33.5 g/dL (ref 30.0–36.0)
MCV: 94.7 fL (ref 80.0–100.0)
Monocytes Absolute: 0.8 10*3/uL (ref 0.1–1.0)
Monocytes Relative: 9 %
Neutro Abs: 7.4 10*3/uL (ref 1.7–7.7)
Neutrophils Relative %: 76 %
Platelet Count: 190 10*3/uL (ref 150–400)
RBC: 3.56 MIL/uL — ABNORMAL LOW (ref 4.22–5.81)
RDW: 26.6 % — ABNORMAL HIGH (ref 11.5–15.5)
WBC Count: 9.8 10*3/uL (ref 4.0–10.5)
nRBC: 0.4 % — ABNORMAL HIGH (ref 0.0–0.2)

## 2020-06-05 LAB — CMP (CANCER CENTER ONLY)
ALT: 980 U/L (ref 0–44)
AST: 291 U/L (ref 15–41)
Albumin: 2.3 g/dL — ABNORMAL LOW (ref 3.5–5.0)
Alkaline Phosphatase: 568 U/L — ABNORMAL HIGH (ref 38–126)
Anion gap: 9 (ref 5–15)
BUN: 17 mg/dL (ref 6–20)
CO2: 23 mmol/L (ref 22–32)
Calcium: 9.3 mg/dL (ref 8.9–10.3)
Chloride: 100 mmol/L (ref 98–111)
Creatinine: 1.23 mg/dL (ref 0.61–1.24)
GFR, Estimated: >60 mL/min (ref >60)
Glucose, Bld: 114 mg/dL — ABNORMAL HIGH (ref 70–99)
Potassium: 3.2 mmol/L — ABNORMAL LOW (ref 3.5–5.1)
Sodium: 132 mmol/L — ABNORMAL LOW (ref 135–145)
Total Bilirubin: 29.3 mg/dL (ref 0.3–1.2)
Total Protein: 6 g/dL — ABNORMAL LOW (ref 6.5–8.1)

## 2020-06-05 MED ORDER — DEXAMETHASONE 2 MG PO TABS: 4.0000 mg | ORAL_TABLET | Freq: Every day | ORAL | 1 refills | Status: DC

## 2020-06-05 NOTE — Progress Notes (Unsigned)
[facility] [place], [place] [address] [phone]   Interval Evaluation  Date of Service: 06/05/20 Patient Name: John Watson Patient MRN: [id number] Patient DOB: 07/16/1960 Provider: Ventura Sellers, MD  Identifying Statement:  John Watson is a 60 y.o. male with right frontal glioblastoma   Oncologic History: 02/13/20: Sterotactic biopsy with Dr. Reatha Armour; path demonstrates glioblastoma 04/28/20: Completes 60 Gy IMRT and concurrent Temozolomide  05/16/20: Hospitalized with jaundice, severe transaminitis  Biomarkers:  MGMT Unknown.  IDH 1/2 Wild type.  EGFR Unknown  TERT Unknown   Interval History:  John Watson present today for evaluation now having completed 6 weeks of radiation and temodar.  Continues to describe stable left sided dysfunction, weakness; currently dosing 81m decadron daily as instructed.  Otherwise no significant issues with the treatment. No further seizures following recent phone visit.  H+P (03/06/20) Patient presented to medical attention in mid-September after experiencing first ever seizures.  Events were described as mainly left sided shaking with post-seizure weakness.  CNS imaging demonstrated enhancing mass in the right frontal lobe consistent with primary brain tumor.  He underwent biopsy with Dr. DReatha Armouron 02/13/20; path was sent out and ultimately confirmed glioblastoma.  Today he complains of increasing weakness of the left arm and leg.  He is walking a little on his own, but for the most part is requiring support to ambulate from family or walker.  His left hand he is not using as much, but he is particularly weak at the left shoulder.  This has worsened somewhat since surgery.  Otherwise no recurrence of seizure episodes, maintains compliance with Keppra.  Not dosing decadron currently.  Medications: Current Outpatient Medications on File Prior to Visit  Medication Sig Dispense  Refill  . baclofen (LIORESAL) 10 MG tablet Take 1 tablet (10 mg total) by mouth 3 (three) times daily as needed for muscle spasms. (Patient taking differently: Take 10 mg by mouth 3 (three) times daily as needed (for hiccups or spasms).) 90 tablet 1  . chlorproMAZINE (THORAZINE) 25 MG tablet Take 25 mg by mouth 3 (three) times daily as needed for hiccoughs.    .Marland Kitchendexamethasone (DECADRON) 2 MG tablet Take 1 tablet (2 mg total) by mouth daily. 30 tablet 1  . GAS RELIEF EXTRA STRENGTH 125 MG chewable tablet Chew 125 mg by mouth every 6 (six) hours as needed for flatulence.    . levETIRAcetam (KEPPRA) 1000 MG tablet Take 1 tablet (1,000 mg total) by mouth 2 (two) times daily. 60 tablet 3  . ondansetron (ZOFRAN) 8 MG tablet Take 1 tablet (8 mg total) by mouth 2 (two) times daily as needed (nausea and vomiting). May take 30-60 minutes prior to Temodar administration if nausea/vomiting occurs. (Patient taking differently: Take 8 mg by mouth 2 (two) times daily as needed (nausea and vomiting).) 30 tablet 1  . PHAZYME 125 MG chewable tablet Chew 125 mg by mouth every 6 (six) hours as needed for flatulence.     No current facility-administered medications on file prior to visit.    Allergies:  Allergies  Allergen Reactions  . Vicodin [Hydrocodone-Acetaminophen] Anaphylaxis   Past Medical History:  Past Medical History:  Diagnosis Date  . Glioblastoma ([facility] 02/13/2020  . Pneumonia 2012  . [place] 8[place]   last one 02/02/2020  . St[facility]    Left leg weakness   Past Surgical History:  Past Surgical History:  Procedure Laterality Date  .  APPLICATION OF CRANIAL NAVIGATION Right 02/13/2020   Procedure: APPLICATION OF CRANIAL NAVIGATION;  Surgeon: [name], [name], DO;  Location: [place];  Service: Neurosurgery;  Laterality: Right;  APPLICATION OF CRANIAL NAVIGATION  . BUBBLE STUDY  01/17/2020   Procedure: BUBBLE STUDY;  Surgeon: [name], MD;  Location: [place];  Service:  Cardiovascular;;  . FRAMELESS  BIOPSY WITH BRAINLAB Right 02/13/2020   Procedure: STEREOTACTIC BRAIN BIOPSY OF RIGHT SIDED LESION;  Surgeon: [name], DO;  Location: [place];  Service: Neurosurgery;  Laterality: Right;  STEREOTACTIC BRAIN BIOPSY OF RIGHT SIDED LESION  . LOOP RECORDER INSERTION N/A 01/17/2020   Procedure: LOOP RECORDER INSERTION;  Surgeon: [name], MD;  Location: [place] CV LAB;  Service: Cardiovascular;  Laterality: N/A;  . TEE WITHOUT CARDIOVERSION N/A 01/17/2020   Procedure: TRANSESOPHAGEAL ECHOCARDIOGRAM (TEE);  Surgeon: [name], MD;  Location: [facility] ENDOSCOPY;  Service: Cardiovascular;  Laterality: N/A;   Social History:  Social History   Socioeconomic History  . Marital status: Single    Spouse name: Not on file  . Number of children: 1  . Years of education: Not on file  . Highest education level: Not on file  Occupational History  . Not on file  Tobacco Use  . Smoking status: Former Smoker    Years: 2.00    Types: Cigars    Quit date: 2002    Years since quitting: 20.0  . Smokeless tobacco: Never Used  Vaping Use  . Vaping Use: Never used  Substance and Sexual Activity  . Alcohol use: Yes    Alcohol/week: 1.0 standard drink    Types: 1 Cans of beer per week    Comment: occasionally  . Drug use: No  . Sexual activity: Yes    Birth control/protection: None  Other Topics Concern  . Not on file  Social History Narrative  . Not on file   Social Determinants of Health   Financial Resource Strain: Not on file  Food Insecurity: Not on file  Transportation Needs: Not on file  Physical Activity: Not on file  Stress: Not on file  Social Connections: Not on file  Intimate Partner Violence: Not on file   Family History:  Family History  Problem Relation Age of Onset  . Hypertension Mother   . Lung cancer Father   . Hyperlipidemia Sister   . Hyperlipidemia Brother     Review of Systems: Constitutional: Doesn't report  fevers, chills or abnormal weight loss Eyes: Doesn't report blurriness of vision Ears, nose, mouth, throat, and face: Doesn't report sore throat Respiratory: Doesn't report cough, dyspnea or wheezes Cardiovascular: Doesn't report palpitation, chest discomfort  Gastrointestinal:  Doesn't report nausea, constipation, diarrhea GU: Doesn't report incontinence Skin: Doesn't report skin rashes Neurological: Per HPI Musculoskeletal: Doesn't report joint pain Behavioral/Psych: Doesn't report anxiety  Physical Exam: There were no vitals filed for this visit. KPS: 70. General: Alert, cooperative, pleasant, in no acute distress Head: Normal EENT: No conjunctival injection or scleral icterus.  Lungs: Resp effort normal Cardiac: Regular rate Abdomen: Non-distended abdomen Skin: No rashes cyanosis or petechiae. Extremities: No clubbing or edema  Neurologic Exam: Mental Status: Awake, alert, attentive to examiner. Oriented to self and environment. Language is fluent with intact comprehension.  Cranial Nerves: Visual acuity is grossly normal. Visual fields are full. Extra-ocular movements intact. No ptosis. Face is symmetric Motor: Tone and bulk are normal. Power is 4/5 in left arm and leg, proximal>distal weakness in arm. Reflexes are  symmetric, no pathologic reflexes present.  Sensory: Intact to light touch Gait: Hemiparetic  Labs: I have reviewed the data as listed    Component Value Date/Time   NA 132 (L) 05/20/2020 0340   K 3.4 (L) 05/20/2020 0340   CL 101 05/20/2020 0340   CO2 22 05/20/2020 0340   GLUCOSE 126 (H) 05/20/2020 0340   BUN 14 05/20/2020 0340   CREATININE 1.00 05/20/2020 0340   CREATININE 1.16 05/16/2020 1130   CALCIUM 8.7 (L) 05/20/2020 0340   PROT 5.2 (L) 05/20/2020 0340   ALBUMIN 2.2 (L) 05/20/2020 0340   AST 225 (H) 05/20/2020 0340   AST 564 (HH) 05/16/2020 1130   ALT 1,230 (H) 05/20/2020 0340   ALT 2,257 (HH) 05/16/2020 1130   ALKPHOS 371 (H) 05/20/2020 0340    BILITOT 17.7 (H) 05/20/2020 0340   BILITOT 23.6 (HH) 05/16/2020 1130   GFRNONAA >60 05/20/2020 0340   GFRNONAA >60 05/16/2020 1130   GFRAA >60 02/14/2020 1122   Lab Results  Component Value Date   WBC 10.9 (H) 05/20/2020   NEUTROABS 8.0 (H) 05/19/2020   HGB 10.2 (L) 05/20/2020   HCT 27.4 (L) 05/20/2020   MCV 87.5 05/20/2020   PLT 232 05/20/2020     Assessment/Plan Glioblastoma of frontal lobe ([place]) [C71.1]  John Watson is clinically stable today, now having completed 6 weeks of IMRT+TMZ.  No new or progressive deficits.  We ultimately recommended continuing with course of intensity modulated radiation therapy and concurrent daily Temozolomide.  Radiation will be administered Mon-Fri over 6 weeks, Temodar will be dosed at 965m/m2 to be given daily over 42 days.  We reviewed side effects of temodar, including fatigue, nausea/vomiting, constipation, and cytopenias.  Chemotherapy should be held for the following:  ANC less than 1,000  Platelets less than 100,000  LFT or creatinine greater than 2x ULN  If clinical concerns/contraindications develop  Every 2 weeks during radiation, labs will be checked accompanied by a clinical evaluation in the brain tumor clinic.  Decadron should be decreased to 451mdaily if tolerated.  We will call him in 1-2 weeks to continue titration.  We ask that John KRAKOWSKIeturn to clinic in 1 months following post radiation brain MRI, or sooner as needed.  All questions were answered. The patient knows to call the clinic with any problems, questions or concerns. No barriers to learning were detected.  The total time spent in the encounter was 30 minutes and more than 50% was on counseling and review of test results   ZaVentura SellersMD Medical Director of Neuro-Oncology CoFrench [place] WeRut[place]1/06/22 11:20 AM  [place] [phone] Interval Evaluation  Date of Service: 06/05/20 Patient Name: John AGUINOatient MRN: [id number] DOB: 11/1960-02-28rovider: ZaVentura SellersMD  Identifying Statement:  MeHARM Watson a [age].o. male with right frontal glioblastoma  Referring Provider: DaKarsten RoDOSturgeon [place] [id number] History: 02/13/20: Sterotactic biopsy with Dr. DaReatha Armourpath demonstrates glioblastoma  Biomarkers:  MGMT Unknown.  IDH 1/2 Wild type.  EGFR Unknown  TERT Unknown   Interval History:  John LAZARZresent today for evaluation now having completed 4 weeks of radiation and temodar.  He feels significantly improved with regards to left sided weakness and imbalance since increasing decadron; currently dosing 65m41maily  as instructed.  Otherwise no significant issues with the treatment.  Sleep has improved from prior.  No further seizures following recent phone visit.  H+P (03/06/20) Patient presented to medical attention in mid-September after experiencing first ever seizures.  Events were described as mainly left sided shaking with post-seizure weakness.  CNS imaging demonstrated enhancing mass in the right frontal lobe consistent with primary brain tumor.  He underwent biopsy with Dr. Reatha Armour on 02/13/20; path was sent out and ultimately confirmed glioblastoma.  Today he complains of increasing weakness of the left arm and leg.  He is walking a little on his own, but for the most part is requiring support to ambulate from family or walker.  His left hand he is not using as much, but he is particularly weak at the left shoulder.  This has worsened somewhat since surgery.  Otherwise no recurrence of seizure episodes, maintains compliance with Keppra.  Not dosing decadron currently.  Medications: Current Outpatient Medications on File Prior to Visit  Medication Sig Dispense Refill  . baclofen (LIORESAL) 10 MG tablet Take 1 tablet  (10 mg total) by mouth 3 (three) times daily as needed for muscle spasms. (Patient taking differently: Take 10 mg by mouth 3 (three) times daily as needed (for hiccups or spasms).) 90 tablet 1  . chlorproMAZINE (THORAZINE) 25 MG tablet Take 25 mg by mouth 3 (three) times daily as needed for hiccoughs.    Marland Kitchen dexamethasone (DECADRON) 2 MG tablet Take 1 tablet (2 mg total) by mouth daily. 30 tablet 1  . GAS RELIEF EXTRA STRENGTH 125 MG chewable tablet Chew 125 mg by mouth every 6 (six) hours as needed for flatulence.    . levETIRAcetam (KEPPRA) 1000 MG tablet Take 1 tablet (1,000 mg total) by mouth 2 (two) times daily. 60 tablet 3  . ondansetron (ZOFRAN) 8 MG tablet Take 1 tablet (8 mg total) by mouth 2 (two) times daily as needed (nausea and vomiting). May take 30-60 minutes prior to Temodar administration if nausea/vomiting occurs. (Patient taking differently: Take 8 mg by mouth 2 (two) times daily as needed (nausea and vomiting).) 30 tablet 1  . PHAZYME 125 MG chewable tablet Chew 125 mg by mouth every 6 (six) hours as needed for flatulence.     No current facility-administered medications on file prior to visit.    Allergies:  Allergies  Allergen Reactions  . Vicodin [Hydrocodone-Acetaminophen] Anaphylaxis   Past Medical History:  Past Medical History:  Diagnosis Date  . Glioblastoma ([place]) 02/13/2020  . Pneumonia 2012  . Seizure ([place]) 8/16/211   last one 02/02/2020  . Stroke [facility])    Left leg weakness   Past Surgical History:  Past Surgical History:  Procedure Laterality Date  . APPLICATION OF CRANIAL NAVIGATION Right 02/13/2020   Procedure: APPLICATION OF CRANIAL NAVIGATION;  Surgeon: [name], [name], DO;  Location: [place];  Service: Neurosurgery;  Laterality: Right;  APPLICATION OF CRANIAL NAVIGATION  . BUBBLE STUDY  01/17/2020   Procedure: BUBBLE STUDY;  Surgeon: [name], MD;  Location: [place];  Service: Cardiovascular;;  . FRAMELESS  BIOPSY WITH BRAINLAB Right  02/13/2020   Procedure: STEREOTACTIC BRAIN BIOPSY OF RIGHT SIDED LESION;  Surgeon: [name], DO;  Location: [place];  Service: Neurosurgery;  Laterality: Right;  STEREOTACTIC BRAIN BIOPSY OF RIGHT SIDED LESION  . LOOP RECORDER INSERTION N/A 01/17/2020   Procedure: LOOP RECORDER INSERTION;  Surgeon: [name], MD;  Location: [place] CV LAB;  Service: Cardiovascular;  Laterality: N/A;  . TEE WITHOUT CARDIOVERSION N/A 01/17/2020   Procedure: TRANSESOPHAGEAL ECHOCARDIOGRAM (TEE);  Surgeon: [name], MD;  Location: [facility] ENDOSCOPY;  Service: Cardiovascular;  Laterality: N/A;   Social History:  Social History   Socioeconomic History  . Marital status: Single    Spouse name: Not on file  . Number of children: 1  . Years of education: Not on file  . Highest education level: Not on file  Occupational History  . Not on file  Tobacco Use  . Smoking status: Former Smoker    Years: 2.00    Types: Cigars    Quit date: 2002    Years since quitting: 20.0  . Smokeless tobacco: Never Used  Vaping Use  . Vaping Use: Never used  Substance and Sexual Activity  . Alcohol use: Yes    Alcohol/week: 1.0 standard drink    Types: 1 Cans of beer per week    Comment: occasionally  . Drug use: No  . Sexual activity: Yes    Birth control/protection: None  Other Topics Concern  . Not on file  Social History Narrative  . Not on file   Social Determinants of Health   Financial Resource Strain: Not on file  Food Insecurity: Not on file  Transportation Needs: Not on file  Physical Activity: Not on file  Stress: Not on file  Social Connections: Not on file  Intimate Partner Violence: Not on file   Family History:  Family History  Problem Relation Age of Onset  . Hypertension Mother   . Lung cancer Father   . Hyperlipidemia Sister   . Hyperlipidemia Brother     Review of Systems: Constitutional: Doesn't report fevers, chills or abnormal weight loss Eyes: Doesn't  report blurriness of vision Ears, nose, mouth, throat, and face: Doesn't report sore throat Respiratory: Doesn't report cough, dyspnea or wheezes Cardiovascular: Doesn't report palpitation, chest discomfort  Gastrointestinal:  Doesn't report nausea, constipation, diarrhea GU: Doesn't report incontinence Skin: Doesn't report skin rashes Neurological: Per HPI Musculoskeletal: Doesn't report joint pain Behavioral/Psych: Doesn't report anxiety  Physical Exam: There were no vitals filed for this visit. KPS: 70. General: Alert, cooperative, pleasant, in no acute distress Head: Normal EENT: No conjunctival injection or scleral icterus.  Lungs: Resp effort normal Cardiac: Regular rate Abdomen: Non-distended abdomen Skin: No rashes cyanosis or petechiae. Extremities: No clubbing or edema  Neurologic Exam: Mental Status: Awake, alert, attentive to examiner. Oriented to self and environment. Language is fluent with intact comprehension.  Cranial Nerves: Visual acuity is grossly normal. Visual fields are full. Extra-ocular movements intact. No ptosis. Face is symmetric Motor: Tone and bulk are normal. Power is 4/5 in left arm and leg, proximal>distal weakness in arm. Reflexes are symmetric, no pathologic reflexes present.  Sensory: Intact to light touch Gait: Hemiparetic  Labs: I have reviewed the data as listed    Component Value Date/Time   NA 132 (L) 05/20/2020 0340   K 3.4 (L) 05/20/2020 0340   CL 101 05/20/2020 0340   CO2 22 05/20/2020 0340   GLUCOSE 126 (H) 05/20/2020 0340   BUN 14 05/20/2020 0340   CREATININE 1.00 05/20/2020 0340   CREATININE 1.16 05/16/2020 1130   CALCIUM 8.7 (L) 05/20/2020 0340   PROT 5.2 (L) 05/20/2020 0340   ALBUMIN 2.2 (L) 05/20/2020 0340   AST 225 (H) 05/20/2020 0340   AST 564 (HH) 05/16/2020 1130   ALT 1,230 (H) 05/20/2020 0340   ALT 2,257 (HH) 05/16/2020 1130   ALKPHOS  371 (H) 05/20/2020 0340   BILITOT 17.7 (H) 05/20/2020 0340   BILITOT 23.6 (HH)  05/16/2020 1130   GFRNONAA >60 05/20/2020 0340   GFRNONAA >60 05/16/2020 1130   GFRAA >60 02/14/2020 1122   Lab Results  Component Value Date   WBC 10.9 (H) 05/20/2020   NEUTROABS 8.0 (H) 05/19/2020   HGB 10.2 (L) 05/20/2020   HCT 27.4 (L) 05/20/2020   MCV 87.5 05/20/2020   PLT 232 05/20/2020     Assessment/Plan Glioblastoma of frontal lobe ([facility]) [C71.1]  John Watson is clinically stable today, now having completed 4 weeks of IMRT+TMZ.  He has responded well to increased corticosteroid therapy.  We ultimately recommended continuing with course of intensity modulated radiation therapy and concurrent daily Temozolomide.  Radiation will be administered Mon-Fri over 6 weeks, Temodar will be dosed at 9m/m2 to be given daily over 42 days.  We reviewed side effects of temodar, including fatigue, nausea/vomiting, constipation, and cytopenias.  Chemotherapy should be held for the following:  ANC less than 1,000  Platelets less than 100,000  LFT or creatinine greater than 2x ULN  If clinical concerns/contraindications develop  Every 2 weeks during radiation, labs will be checked accompanied by a clinical evaluation in the brain tumor clinic.  We recommended continuing decadron at 628mdaily.  He will return to clinic in 2 weeks with labs for evaluation.  All questions were answered. The patient knows to call the clinic with any problems, questions or concerns. No barriers to learning were detected.  The total time spent in the encounter was 30 minutes and more than 50% was on counseling and review of test results   ZaVentura SellersMD Medical Director of Neuro-Oncology CoLake District Hospitalt WeSummit1/06/22 11:20 AM

## 2020-06-05 NOTE — Progress Notes (Signed)
[facility] [place], [place] [address] [phone]   Interval Evaluation  Date of Service: 06/05/20 Patient Name: John Watson Patient MRN: [id number] Patient DOB: February 17, 1961 Provider: Ventura Sellers, MD  Identifying Statement:  John Watson is a 60 y.o. male with right frontal glioblastoma  Referring Provider: [name], [address] [place],  [place] [address]  Oncologic History: 02/13/20: Sterotactic biopsy with Dr. Reatha Armour; path demonstrates glioblastoma 04/28/20: Completes radiation and concurrent Temozolomide 05/16/20: Hospitalized with jaundice and severe transaminitis  Biomarkers:  MGMT Unknown.  IDH 1/2 Wild type.  EGFR Unknown  TERT Unknown   Interval History:  John Watson present today for evaluation now having completed radiation and temodar, and also having undergone recent hospitalization for jaundice.  Had been feeling significantly improved as of last week, but over past several days complaints of worsening left sided dysfunction, weakness, particularly dragging the left leg.  He also describes general increase in fatigue, lethargy, ill feeling and ongoing "yellow eyes".  Decadron is currently dosed at only 22m daily. No seizures or headaches.   H+P (03/06/20) Patient presented to medical attention in mid-September after experiencing first ever seizures.  Events were described as mainly left sided shaking with post-seizure weakness.  CNS imaging demonstrated enhancing mass in the right frontal lobe consistent with primary brain tumor.  He underwent biopsy with Dr. DReatha Armouron 02/13/20; path was sent out and ultimately confirmed glioblastoma.  Today he complains of increasing weakness of the left arm and leg.  He is walking a little on his own, but for the most part is requiring support to ambulate from family or walker.  His left hand he is not using as much, but he is particularly  weak at the left shoulder.  This has worsened somewhat since surgery.  Otherwise no recurrence of seizure episodes, maintains compliance with Keppra.  Not dosing decadron currently.  Medications: Current Outpatient Medications on File Prior to Visit  Medication Sig Dispense Refill  . baclofen (LIORESAL) 10 MG tablet Take 1 tablet (10 mg total) by mouth 3 (three) times daily as needed for muscle spasms. (Patient taking differently: Take 10 mg by mouth 3 (three) times daily as needed (for hiccups or spasms).) 90 tablet 1  . chlorproMAZINE (THORAZINE) 25 MG tablet Take 25 mg by mouth 3 (three) times daily as needed for hiccoughs.    .Marland Kitchendexamethasone (DECADRON) 2 MG tablet Take 1 tablet (2 mg total) by mouth daily. 30 tablet 1  . GAS RELIEF EXTRA STRENGTH 125 MG chewable tablet Chew 125 mg by mouth every 6 (six) hours as needed for flatulence.    . levETIRAcetam (KEPPRA) 1000 MG tablet Take 1 tablet (1,000 mg total) by mouth 2 (two) times daily. 60 tablet 3  . ondansetron (ZOFRAN) 8 MG tablet Take 1 tablet (8 mg total) by mouth 2 (two) times daily as needed (nausea and vomiting). May take 30-60 minutes prior to Temodar administration if nausea/vomiting occurs. (Patient taking differently: Take 8 mg by mouth 2 (two) times daily as needed (nausea and vomiting).) 30 tablet 1  . PHAZYME 125 MG chewable tablet Chew 125 mg by mouth every 6 (six) hours as needed for flatulence.     No current facility-administered medications on file prior to visit.    Allergies:  Allergies  Allergen Reactions  . Vicodin [Hydrocodone-Acetaminophen] Anaphylaxis   Past Medical History:  Past Medical History:  Diagnosis Date  .  Glioblastoma ([place]) 02/13/2020  . Pneumonia 2012  . Seizure ([place]) 8/16/211   last one 02/02/2020  . Stroke [facility])    Left leg weakness   Past Surgical History:  Past Surgical History:  Procedure Laterality Date  . APPLICATION OF CRANIAL NAVIGATION Right 02/13/2020   Procedure: APPLICATION OF  CRANIAL NAVIGATION;  Surgeon: [name], [name], DO;  Location: [facility];  Service: Neurosurgery;  Laterality: Right;  APPLICATION OF CRANIAL NAVIGATION  . BUBBLE STUDY  01/17/2020   Procedure: BUBBLE STUDY;  Surgeon: [name], MD;  Location: [facility];  Service: Cardiovascular;;  . FRAMELESS  BIOPSY WITH BRAINLAB Right 02/13/2020   Procedure: STEREOTACTIC BRAIN BIOPSY OF RIGHT SIDED LESION;  Surgeon: [name], DO;  Location: [facility];  Service: Neurosurgery;  Laterality: Right;  STEREOTACTIC BRAIN BIOPSY OF RIGHT SIDED LESION  . LOOP RECORDER INSERTION N/A 01/17/2020   Procedure: LOOP RECORDER INSERTION;  Surgeon: [name], MD;  Location: [place] CV LAB;  Service: Cardiovascular;  Laterality: N/A;  . TEE WITHOUT CARDIOVERSION N/A 01/17/2020   Procedure: TRANSESOPHAGEAL ECHOCARDIOGRAM (TEE);  Surgeon: [name], MD;  [facility] END[facility]COPY;  Service: Cardiovascular;  Laterality: N/A;   Social History:  Social History   Socioeconomic History  . Marital status: Single    Spouse name: Not on file  . Number of children: 1  . Years of education: Not on file  . Highest education level: Not on file  Occupational History  . Not on file  Tobacco Use  . Smoking status: Former Smoker    Years: 2.00    Types: Cigars    Quit date: 2002    Years since quitting: 20.0  . Smokeless tobacco: Never Used  Vaping Use  . Vaping Use: Never used  Substance and Sexual Activity  . Alcohol use: Yes    Alcohol/week: 1.0 standard drink    Types: 1 Cans of beer per week    Comment: occasionally  . Drug use: No  . Sexual activity: Yes    Birth control/protection: None  Other Topics Concern  . Not on file  Social History Narrative  . Not on file   Social Determinants of Health   Financial Resource Strain: Not on file  Food Insecurity: Not on file  Transportation Needs: Not on file  Physical Activity: Not on file  Stress: Not on file  Social Connections: Not on  file  Intimate Partner Violence: Not on file   Family History:  Family History  Problem Relation Age of Onset  . Hypertension Mother   . Lung cancer Father   . Hyperlipidemia Sister   . Hyperlipidemia Brother     Review of Systems: Constitutional: ++weight loss Eyes: Doesn't report blurriness of vision Ears, nose, mouth, throat, and face: Doesn't report sore throat Respiratory: Doesn't report cough, dyspnea or wheezes Cardiovascular: Doesn't report palpitation, chest discomfort  Gastrointestinal:  Doesn't report nausea, constipation, diarrhea GU: Doesn't report incontinence Skin: Doesn't report skin rashes Neurological: Per HPI Musculoskeletal: Doesn't report joint pain Behavioral/Psych: Doesn't report anxiety  Physical Exam: Vitals:   06/05/20 1202  BP: 135/81  Pulse: 86  Resp: 18  Temp: (!) 97.4 F (36.3 C)  SpO2: 100%   KPS: 70. General: Jaundiced appearance Head: Normal EENT: No conjunctival injection or scleral icterus.  Lungs: Resp effort normal Cardiac: Regular rate Abdomen: Non-distended abdomen Skin: No rashes cyanosis or petechiae. Extremities: No clubbing or edema  Neurologic Exam: Mental Status: Awake, alert, attentive to examiner. Oriented to  self and environment. Language is fluent with intact comprehension.  Cranial Nerves: Visual acuity is grossly normal. Visual fields are full. Extra-ocular movements intact. No ptosis. Face is symmetric Motor: Tone and bulk are normal. Power is 4/5 in left arm and leg, proximal>distal weakness in arm. Reflexes are symmetric, no pathologic reflexes present.  Sensory: Intact to light touch Gait: Hemiparetic  Labs: I have reviewed the data as listed    Component Value Date/Time   NA 132 (L) 06/05/2020 1145   K 3.2 (L) 06/05/2020 1145   CL 100 06/05/2020 1145   CO2 23 06/05/2020 1145   GLUCOSE 114 (H) 06/05/2020 1145   BUN 17 06/05/2020 1145   CREATININE 1.23 06/05/2020 1145   CALCIUM 9.3 06/05/2020 1145    PROT 6.0 (L) 06/05/2020 1145   ALBUMIN 2.3 (L) 06/05/2020 1145   AST 291 (HH) 06/05/2020 1145   ALT 980 (HH) 06/05/2020 1145   ALKPHOS 568 (H) 06/05/2020 1145   BILITOT 29.3 (HH) 06/05/2020 1145   GFRNONAA >60 06/05/2020 1145   GFRAA >60 02/14/2020 1122   Lab Results  Component Value Date   WBC 9.8 06/05/2020   NEUTROABS PENDING 06/05/2020   HGB 11.3 (L) 06/05/2020   HCT 33.7 (L) 06/05/2020   MCV 94.7 06/05/2020   PLT 190 06/05/2020    Imaging:  [place] Clinician Interpretation: I have personally reviewed the CNS images as listed.  My interpretation, in the context of the patient's clinical presentation, is likely treatment effect  MR BRAIN W WO CONTRAST  Result Date: 05/29/2020 CLINICAL DATA:  Glioblastoma follow-up post concurrent chemoradiation EXAM: MRI HEAD WITHOUT AND WITH CONTRAST TECHNIQUE: Multiplanar, multiecho pulse sequences of the brain and surrounding structures were obtained without and with intravenous contrast. CONTRAST:  4m GADAVIST GADOBUTROL 1 MMOL/ML IV SOLN COMPARISON:  02/10/2020 FINDINGS: Brain: Increase in size and increased necrosis of right parasagittal frontal lesion involving the posterior right body of the corpus callosum. This currently measures 4.5 x 2.9 x 4.1 cm (remeasured on prior 2.8 x 2.9 x 2.8 cm). Significant increase in surrounding T2 FLAIR hyperintensity. Persistent regional mass effect including effacement of the adjacent posterior right lateral ventricle body. There are associated chronic blood products. Right parietal cortical/subcortical T2 FLAIR hyperintensity without enhancement is not significantly changed. Additional scattered foci of T2 hyperintensity in the supratentorial white matter are nonspecific but may reflect stable chronic microvascular ischemic changes. Interval biopsy tract with chronic blood products extending from the right frontal calvarium to the parasagittal frontal lesion. There is no acute infarction.  No hydrocephalus.  Vascular: Major vessel flow voids at the skull base are preserved. Skull and upper cervical spine: Normal marrow signal is preserved. Sinuses/Orbits: Mild mucosal thickening.  Orbits are unremarkable. Other: Sella is unremarkable.  Mastoid air cells are clear. IMPRESSION: Increased size and necrosis of right parasagittal frontal lesion with increased surrounding abnormal T2 FLAIR signal. Pseudoprogression favored at this time. No significant change in right parietal cortical/subcortical abnormal signal likely reflecting discontiguous nonenhancing tumor. Electronically Signed   By: PMacy MisM.D.   On: 05/29/2020 15:09   CT CHEST ABDOMEN PELVIS W CONTRAST  Result Date: 05/16/2020 CLINICAL DATA:  Abdominal mass, intra-abdominal neoplasm suspected hx glioblastoma, acute transaminitis with jaundice History of glioblastoma. EXAM: CT CHEST, ABDOMEN, AND PELVIS WITH CONTRAST TECHNIQUE: Multidetector CT imaging of the chest, abdomen and pelvis was performed following the standard protocol during bolus administration of intravenous contrast. CONTRAST:  1021mOMNIPAQUE IOHEXOL 300 MG/ML  SOLN COMPARISON:  Abdominal ultrasound earlier today.  Chest abdomen pelvis CT 01/15/2020 FINDINGS: CT CHEST FINDINGS Cardiovascular: Mild aortic atherosclerosis. No aortic aneurysm or dissection. Central pulmonary arteries are well opacified. Heart is normal in size. No pericardial effusion. Mediastinum/Nodes: No enlarged mediastinal or hilar lymph nodes. No axillary adenopathy. No thyroid nodule. No esophageal wall thickening. Lungs/Pleura: Minor dependent atelectasis in both lung bases. Additional linear subsegmental atelectasis in the right lower lobe. No confluent consolidation. There is no pulmonary mass. Tiny calcified nodule at the right lung apex consistent with prior granulomatous disease. Stable tiny perifissural nodule in the right middle lobe, series 5, image 84. No new or progressive pulmonary nodule. Trachea and  central bronchi are patent. No pleural fluid. No findings of pulmonary edema. Musculoskeletal: No acute osseous abnormality or focal bone lesion. Scattered Schmorl's nodes in the thoracic spine. CT ABDOMEN PELVIS FINDINGS Hepatobiliary: Mild hepatic steatosis. No significant change in the scattered subcentimeter hepatic hypodensities from prior exam. Largest lesion in the subcapsular right lobe measures 10 mm. No progression or new lesion. Calcified gallstones within decompressed gallbladder. No pericholecystic fat stranding. There is no intra or extrahepatic biliary ductal dilatation. No calcified choledocholithiasis. Pancreas: Motion artifact through the head and uncinate process of the pancreas. No evidence of focal or obstructing lesion. No ductal dilatation or inflammation. Spleen: Normal in size without focal abnormality. Adrenals/Urinary Tract: Normal adrenal glands without nodule. Motion artifact through the kidneys. No hydronephrosis or perinephric edema. Homogeneous renal enhancement with symmetric excretion on delayed phase imaging. Again seen 8 mm cyst in the lower left kidney. Urinary bladder is partially distended without wall thickening. Stomach/Bowel: Evaluation of bowel loops in the mid abdomen is limited given patient motion. Equivocal Peri pyloric gastric wall thickening, series 3, image 57. No proximal gastric obstruction or abnormal distention. No small bowel distension. No obvious bowel inflammation or evidence of bowel mass. Appendix not confidently visualized. Vascular/Lymphatic: Aortic atherosclerosis. No aortic aneurysm. Patent portal vein. Assessment for adenopathy is limited given motion. Allowing for this, no bulky abdominopelvic lymph nodes. Reproductive: Enlarged prostate gland spanning 5.4 cm transverse. There is mild mass effect on the bladder base. Other: No ascites or free air. No evidence of intra-abdominal mass allowing for motion. Umbilical hernia on prior exam is obscured by  motion. Musculoskeletal: No acute osseous abnormalities or focal osseous lesion. IMPRESSION: 1. No evidence of malignancy in the chest, abdomen, or pelvis allowing for patient motion. 2. Gallstones in contracted gallbladder. There is no biliary obstruction or abnormal distention. No explanation for jaundice. 3. Mild hepatic steatosis is well as scattered subcentimeter hepatic lesions stable from CT 4 months ago. 4. Equivocal peri pyloric gastric wall thickening, can be seen with gastritis. 5. Enlarged prostate gland. 6. Punctate perifissural nodule in the right middle lobe is unchanged. Aortic Atherosclerosis (ICD10-I70.0). Electronically Signed   By: Keith Rake M.D.   On: 05/16/2020 19:49   CUP PACEART REMOTE DEVICE CHECK  Result Date: 06/03/2020 ILR summary report received. Battery status OK. Normal device function. No new symptom, tachy, brady, or pause episodes. No new AF episodes. Monthly summary reports and ROV/PRN. HB  US Abdomen Limited RUQ (LIVER/GB)  Result Date: 05/16/2020 CLINICAL DATA:  Transaminitis. EXAM: ULTRASOUND ABDOMEN LIMITED RIGHT UPPER QUADRANT COMPARISON:  None. FINDINGS: Gallbladder: The gallbladder is contracted. Suggestion of a gallstone within the gallbladder lumen measuring up to 7 mm. No wall thickening visualized. No sonographic Murphy sign noted by sonographer. Common bile duct: Diameter: 3 mm Liver: No focal lesion identified. Within normal limits parenchymal echogenicity. Portal vein is patent on  color Doppler imaging with normal direction of blood flow towards the liver. Other: Pancreas not visualized. IMPRESSION: 1. Limited evaluation due to contracted gallbladder and bowel gas. Pancreas not visualized. 2. Suggestion of cholelithiasis. Electronically Signed   By: Iven Finn M.D.   On: 05/16/2020 17:04    Assessment/Plan Glioblastoma of frontal lobe ([place]) [C71.1]  John Watson demonstrates focal neurologic changes today, in addition to less specific  systemic symptoms such as fatigue, lethargy, weight loss. He has now completed 6 weeks of IMRT+TMZ.  MRI demonstrates localized progressive changes, all within high dose radiation field, consistent with likely treatment effect.  Timing, and degree of necrosis is suggestive of pseudo-progression.   No clear etiology for transaminitis and hyperbilirubemia at this time.  Unfortunately bili has increased again, although ALT has decreased and AST increased only modestly.  He is symptomatic from these metabolic changes, although he maintains functional independence for the most part.  He is not a candidate for chemotherapy until hepatobiliary issue is addressed and resolved.  If chemotherapy/TMZ exposure is precipitant, it is unclear why counts would decrease, then increase again despite no further exposure.  Doubt association with corticosteroids or keppra.  Urgent referral will be placed for GI consult, patient was initially referred back to Dr. Collene Mares from hospitalist, but this was never followed up on.  In lieu of this consultation we will recommend rechecking labs again in 2 weeks.    He is not interested in [place] device for glioblastoma management.  Decadron will be increased to 50m daily to progressive focal symptoms.  Goals of care discussion was broached today as well.  We ask that MJonetta Speakreach out to uKoreaonce GI consult/visit is scheduled, we would like to visit with him shortly thereafter.  All questions were answered. The patient knows to call the clinic with any problems, questions or concerns. No barriers to learning were detected.  The total time spent in the encounter was 40 minutes and more than 50% was on counseling and review of test results   ZVentura Sellers MD Medical Director of Neuro-Oncology [place]01/06/22 12:00 PM  [place] [phone]  Interval Evaluation  Date of Service: 06/05/20 Patient Name: MKIYON FIDALGOPatient MRN: [id number] DOB: 704/25/1962Provider: ZVentura Sellers MD  Identifying Statement:  John VACHAis a 60y.o. male with right frontal glioblastoma  Referring Provider: DKarsten Ro [place]  [place] [id number] Oncologic History: 02/13/20: Sterotactic biopsy with Dr. DReatha Armour path demonstrates glioblastoma  Biomarkers:  MGMT Unknown.  IDH 1/2 Wild type.  EGFR Unknown  TERT Unknown   Interval History:  John BRACEYpresent today for evaluation now having completed 4 weeks of radiation and temodar.  He feels significantly improved with regards to left sided weakness and imbalance since increasing decadron; currently dosing 623mdaily as instructed.  Otherwise no significant issues with the treatment.  Sleep has improved from prior.  No further seizures following recent phone visit.  H+P (03/06/20) Patient presented to medical attention in mid-September after experiencing first ever seizures.  Events were described as mainly left sided shaking with post-seizure weakness.  CNS imaging demonstrated enhancing mass in the right frontal lobe consistent with primary brain tumor.  He underwent biopsy with Dr. DaReatha Armourn 02/13/20; path was sent out and ultimately confirmed glioblastoma.  Today he complains of increasing weakness of the left arm and leg.  He is walking a little on his own, but for the most part is requiring support to ambulate from family or walker.  His left hand he is not using as much, but he is particularly weak at the left shoulder.  This has worsened somewhat since surgery.  Otherwise no recurrence of seizure episodes, maintains compliance with Keppra.  Not dosing decadron currently.  Medications: Current Outpatient Medications on File Prior to Visit  Medication Sig Dispense Refill  . baclofen (LIORESAL) 10 MG tablet Take 1 tablet  (10 mg total) by mouth 3 (three) times daily as needed for muscle spasms. (Patient taking differently: Take 10 mg by mouth 3 (three) times daily as needed (for hiccups or spasms).) 90 tablet 1  . chlorproMAZINE (THORAZINE) 25 MG tablet Take 25 mg by mouth 3 (three) times daily as needed for hiccoughs.    Marland Kitchen dexamethasone (DECADRON) 2 MG tablet Take 1 tablet (2 mg total) by mouth daily. 30 tablet 1  . GAS RELIEF EXTRA STRENGTH 125 MG chewable tablet Chew 125 mg by mouth every 6 (six) hours as needed for flatulence.    . levETIRAcetam (KEPPRA) 1000 MG tablet Take 1 tablet (1,000 mg total) by mouth 2 (two) times daily. 60 tablet 3  . ondansetron (ZOFRAN) 8 MG tablet Take 1 tablet (8 mg total) by mouth 2 (two) times daily as needed (nausea and vomiting). May take 30-60 minutes prior to Temodar administration if nausea/vomiting occurs. (Patient taking differently: Take 8 mg by mouth 2 (two) times daily as needed (nausea and vomiting).) 30 tablet 1  . PHAZYME 125 MG chewable tablet Chew 125 mg by mouth every 6 (six) hours as needed for flatulence.     No current facility-administered medications on file prior to visit.    Allergies:  Allergies  Allergen Reactions  . Vicodin [Hydrocodone-Acetaminophen] Anaphylaxis   Past Medical History:  Past Medical History:  Diagnosis Date  . Glioblastoma ([place]) 02/13/2020  . Pneumonia 2012  . Seizure ([place]) 8/16/211   last one 02/02/2020  . Stroke [facility])    Left leg weakness   Past Surgical History:  Past Surgical History:  Procedure Laterality Date  . APPLICATION OF CRANIAL NAVIGATION Right 02/13/2020   Procedure: APPLICATION OF CRANIAL NAVIGATION;  Surgeon: [name], [name], DO;  Location: [facility];  Service: Neurosurgery;  Laterality: Right;  APPLICATION OF CRANIAL NAVIGATION  . BUBBLE STUDY  01/17/2020   Procedure: BUBBLE STUDY;  Surgeon: [name], MD;  Location: [facility];  Service: Cardiovascular;;  . FRAMELESS  BIOPSY WITH BRAINLAB Right  02/13/2020   Procedure: STEREOTACTIC BRAIN BIOPSY OF RIGHT SIDED LESION;  Surgeon: [name], DO;  Location: [facility];  Service: Neurosurgery;  Laterality: Right;  STEREOTACTIC BRAIN BIOPSY OF RIGHT SIDED LESION  . LOOP RECORDER INSERTION N/A 01/17/2020   Procedure: LOOP RECORDER INSERTION;  Surgeon: [name], MD;  Location: [place] CV LAB;  Service: Cardiovascular;  Laterality: N/A;  . TEE WITHOUT CARDIOVERSION N/A 01/17/2020   Procedure: TRANSESOPHAGEAL ECHOCARDIOGRAM (TEE);  Surgeon: [name], MD;  Location: [facility] ENDOSCOPY;  Service: Cardiovascular;  Laterality: N/A;   Social History:  Social History   Socioeconomic History  . Marital status: Single    Spouse name: Not on file  . Number of children: 1  . Years of education: Not on file  . Highest education level: Not on file  Occupational History  . Not on file  Tobacco Use  . Smoking status: Former Smoker    Years: 2.00    Types: Cigars    Quit date: 2002    Years since quitting: 20.0  . Smokeless tobacco: Never Used  Vaping Use  . Vaping Use: Never used  Substance and Sexual Activity  . Alcohol use: Yes    Alcohol/week: 1.0 standard drink    Types: 1 Cans of beer per week    Comment: occasionally  . Drug use: No  . Sexual activity: Yes    Birth control/protection: None  Other Topics Concern  . Not on file  Social History Narrative  . Not on file   Social Determinants of Health   Financial Resource Strain: Not on file  Food Insecurity: Not on file  Transportation Needs: Not on file  Physical Activity: Not on file  Stress: Not on file  Social Connections: Not on file  Intimate Partner Violence: Not on file   Family History:  Family History  Problem Relation Age of Onset  . Hypertension Mother   . Lung cancer Father   . Hyperlipidemia Sister   . Hyperlipidemia Brother     Review of Systems: Constitutional: Doesn't report fevers, chills or abnormal weight loss Eyes: Doesn't  report blurriness of vision Ears, nose, mouth, throat, and face: Doesn't report sore throat Respiratory: Doesn't report cough, dyspnea or wheezes Cardiovascular: Doesn't report palpitation, chest discomfort  Gastrointestinal:  Doesn't report nausea, constipation, diarrhea GU: Doesn't report incontinence Skin: Doesn't report skin rashes Neurological: Per HPI Musculoskeletal: Doesn't report joint pain Behavioral/Psych: Doesn't report anxiety  Physical Exam: There were no vitals filed for this visit. KPS: 70. General: Alert, cooperative, pleasant, in no acute distress Head: Normal EENT: No conjunctival injection or scleral icterus.  Lungs: Resp effort normal Cardiac: Regular rate Abdomen: Non-distended abdomen Skin: No rashes cyanosis or petechiae. Extremities: No clubbing or edema  Neurologic Exam: Mental Status: Awake, alert, attentive to examiner. Oriented to self and environment. Language is fluent with intact comprehension.  Cranial Nerves: Visual acuity is grossly normal. Visual fields are full. Extra-ocular movements intact. No ptosis. Face is symmetric Motor: Tone and bulk are normal. Power is 4/5 in left arm and leg, proximal>distal weakness in arm. Reflexes are symmetric, no pathologic reflexes present.  Sensory: Intact to light touch Gait: Hemiparetic  Labs: I have reviewed the data as listed    Component Value Date/Time   NA 132 (L) 05/20/2020 0340   K 3.4 (L) 05/20/2020 0340   CL 101 05/20/2020 0340   CO2 22 05/20/2020 0340   GLUCOSE 126 (H) 05/20/2020 0340   BUN 14 05/20/2020 0340   CREATININE 1.00 05/20/2020 0340   CREATININE 1.16 05/16/2020 1130   CALCIUM 8.7 (L) 05/20/2020 0340   PROT 5.2 (L) 05/20/2020 0340   ALBUMIN 2.2 (L) 05/20/2020 0340   AST 225 (H) 05/20/2020 0340   AST 564 (HH) 05/16/2020 1130   ALT 1,230 (H) 05/20/2020 0340   ALT 2,257 (HH) 05/16/2020 1130   ALKPHOS 371 (H) 05/20/2020 0340   BILITOT 17.7 (H) 05/20/2020 0340   BILITOT 23.6 (HH)  05/16/2020 1130   GFRNONAA >60 05/20/2020 0340   GFRNONAA >60 05/16/2020 1130   GFRAA >60 02/14/2020 1122   Lab Results  Component Value Date   WBC 9.8 06/05/2020   NEUTROABS PENDING 06/05/2020   HGB 11.3 (L) 06/05/2020   HCT 33.7 (L) 06/05/2020   MCV 94.7 06/05/2020   PLT 190 06/05/2020    Imaging:  [place] Clinician Interpretation: I  have personally reviewed the CNS images as listed.  My interpretation, in the context of the patient's clinical presentation, is stable disease  MR BRAIN W WO CONTRAST  Result Date: 05/29/2020 CLINICAL DATA:  Glioblastoma follow-up post concurrent chemoradiation EXAM: MRI HEAD WITHOUT AND WITH CONTRAST TECHNIQUE: Multiplanar, multiecho pulse sequences of the brain and surrounding structures were obtained without and with intravenous contrast. CONTRAST:  26m GADAVIST GADOBUTROL 1 MMOL/ML IV SOLN COMPARISON:  02/10/2020 FINDINGS: Brain: Increase in size and increased necrosis of right parasagittal frontal lesion involving the posterior right body of the corpus callosum. This currently measures 4.5 x 2.9 x 4.1 cm (remeasured on prior 2.8 x 2.9 x 2.8 cm). Significant increase in surrounding T2 FLAIR hyperintensity. Persistent regional mass effect including effacement of the adjacent posterior right lateral ventricle body. There are associated chronic blood products. Right parietal cortical/subcortical T2 FLAIR hyperintensity without enhancement is not significantly changed. Additional scattered foci of T2 hyperintensity in the supratentorial white matter are nonspecific but may reflect stable chronic microvascular ischemic changes. Interval biopsy tract with chronic blood products extending from the right frontal calvarium to the parasagittal frontal lesion. There is no acute infarction.  No hydrocephalus. Vascular: Major vessel flow voids at the skull base are preserved. Skull and upper cervical spine: Normal marrow signal is preserved. Sinuses/Orbits: Mild mucosal  thickening.  Orbits are unremarkable. Other: Sella is unremarkable.  Mastoid air cells are clear. IMPRESSION: Increased size and necrosis of right parasagittal frontal lesion with increased surrounding abnormal T2 FLAIR signal. Pseudoprogression favored at this time. No significant change in right parietal cortical/subcortical abnormal signal likely reflecting discontiguous nonenhancing tumor. Electronically Signed   By: PMacy MisM.D.   On: 05/29/2020 15:09   CT CHEST ABDOMEN PELVIS W CONTRAST  Result Date: 05/16/2020 CLINICAL DATA:  Abdominal mass, intra-abdominal neoplasm suspected hx glioblastoma, acute transaminitis with jaundice History of glioblastoma. EXAM: CT CHEST, ABDOMEN, AND PELVIS WITH CONTRAST TECHNIQUE: Multidetector CT imaging of the chest, abdomen and pelvis was performed following the standard protocol during bolus administration of intravenous contrast. CONTRAST:  1039mOMNIPAQUE IOHEXOL 300 MG/ML  SOLN COMPARISON:  Abdominal ultrasound earlier today. Chest abdomen pelvis CT 01/15/2020 FINDINGS: CT CHEST FINDINGS Cardiovascular: Mild aortic atherosclerosis. No aortic aneurysm or dissection. Central pulmonary arteries are well opacified. Heart is normal in size. No pericardial effusion. Mediastinum/Nodes: No enlarged mediastinal or hilar lymph nodes. No axillary adenopathy. No thyroid nodule. No esophageal wall thickening. Lungs/Pleura: Minor dependent atelectasis in both lung bases. Additional linear subsegmental atelectasis in the right lower lobe. No confluent consolidation. There is no pulmonary mass. Tiny calcified nodule at the right lung apex consistent with prior granulomatous disease. Stable tiny perifissural nodule in the right middle lobe, series 5, image 84. No new or progressive pulmonary nodule. Trachea and central bronchi are patent. No pleural fluid. No findings of pulmonary edema. Musculoskeletal: No acute osseous abnormality or focal bone lesion. Scattered Schmorl's  nodes in the thoracic spine. CT ABDOMEN PELVIS FINDINGS Hepatobiliary: Mild hepatic steatosis. No significant change in the scattered subcentimeter hepatic hypodensities from prior exam. Largest lesion in the subcapsular right lobe measures 10 mm. No progression or new lesion. Calcified gallstones within decompressed gallbladder. No pericholecystic fat stranding. There is no intra or extrahepatic biliary ductal dilatation. No calcified choledocholithiasis. Pancreas: Motion artifact through the head and uncinate process of the pancreas. No evidence of focal or obstructing lesion. No ductal dilatation or inflammation. Spleen: Normal in size without focal abnormality. Adrenals/Urinary Tract: Normal adrenal glands without nodule. Motion  artifact through the kidneys. No hydronephrosis or perinephric edema. Homogeneous renal enhancement with symmetric excretion on delayed phase imaging. Again seen 8 mm cyst in the lower left kidney. Urinary bladder is partially distended without wall thickening. Stomach/Bowel: Evaluation of bowel loops in the mid abdomen is limited given patient motion. Equivocal Peri pyloric gastric wall thickening, series 3, image 57. No proximal gastric obstruction or abnormal distention. No small bowel distension. No obvious bowel inflammation or evidence of bowel mass. Appendix not confidently visualized. Vascular/Lymphatic: Aortic atherosclerosis. No aortic aneurysm. Patent portal vein. Assessment for adenopathy is limited given motion. Allowing for this, no bulky abdominopelvic lymph nodes. Reproductive: Enlarged prostate gland spanning 5.4 cm transverse. There is mild mass effect on the bladder base. Other: No ascites or free air. No evidence of intra-abdominal mass allowing for motion. Umbilical hernia on prior exam is obscured by motion. Musculoskeletal: No acute osseous abnormalities or focal osseous lesion. IMPRESSION: 1. No evidence of malignancy in the chest, abdomen, or pelvis allowing for  patient motion. 2. Gallstones in contracted gallbladder. There is no biliary obstruction or abnormal distention. No explanation for jaundice. 3. Mild hepatic steatosis is well as scattered subcentimeter hepatic lesions stable from CT 4 months ago. 4. Equivocal peri pyloric gastric wall thickening, can be seen with gastritis. 5. Enlarged prostate gland. 6. Punctate perifissural nodule in the right middle lobe is unchanged. Aortic Atherosclerosis (ICD10-I70.0). Electronically Signed   By: Keith Rake M.D.   On: 05/16/2020 19:49   CUP PACEART REMOTE DEVICE CHECK  Result Date: 06/03/2020 ILR summary report received. Battery status OK. Normal device function. No new symptom, tachy, brady, or pause episodes. No new AF episodes. Monthly summary reports and ROV/PRN. HB  US Abdomen Limited RUQ (LIVER/GB)  Result Date: 05/16/2020 CLINICAL DATA:  Transaminitis. EXAM: ULTRASOUND ABDOMEN LIMITED RIGHT UPPER QUADRANT COMPARISON:  None. FINDINGS: Gallbladder: The gallbladder is contracted. Suggestion of a gallstone within the gallbladder lumen measuring up to 7 mm. No wall thickening visualized. No sonographic Murphy sign noted by sonographer. Common bile duct: Diameter: 3 mm Liver: No focal lesion identified. Within normal limits parenchymal echogenicity. Portal vein is patent on color Doppler imaging with normal direction of blood flow towards the liver. Other: Pancreas not visualized. IMPRESSION: 1. Limited evaluation due to contracted gallbladder and bowel gas. Pancreas not visualized. 2. Suggestion of cholelithiasis. Electronically Signed   By: Iven Finn M.D.   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  He has responded well to increased corticosteroid therapy.  We ultimately recommended continuing with course of intensity modulated radiation therapy and  concurrent daily Temozolomide.  Radiation will be administered Mon-Fri over 6 weeks, Temodar will be dosed at 40m/m2 to be given daily over 42 days.  We reviewed side effects of temodar, including fatigue, nausea/vomiting, constipation, and cytopenias.  Chemotherapy should be held for the following:  ANC less than 1,000  Platelets less than 100,000  LFT or creatinine greater than 2x ULN  If clinical concerns/contraindications develop  Every 2 weeks during radiation, labs will be checked accompanied by a clinical evaluation in the brain tumor clinic.  We recommended continuing decadron at 681mdaily.  He will return to clinic in 2 weeks with labs for evaluation.  All questions were answered. The patient knows to call the clinic with any problems, questions or concerns. No barriers to learning were detected.  The total  time spent in the encounter was 30 minutes and more than 50% was on counseling and review of test results   Ventura Sellers, MD Medical Director of Neuro-Oncology [facility] at [place] 06/05/20 12:00 PM

## 2020-06-05 NOTE — Progress Notes (Signed)
Patient was scheduled for visit with Dr Russella Dar with [facility] 06/11/2019 @ 1:30pm.  Patient notified of appointment.

## 2020-06-06 ENCOUNTER — Other Ambulatory Visit: Payer: Self-pay | Admitting: Internal Medicine

## 2020-06-06 ENCOUNTER — Encounter: Payer: Self-pay | Admitting: Internal Medicine

## 2020-06-06 MED ORDER — CHLORPROMAZINE HCL 25 MG PO TABS: 25.0000 mg | ORAL_TABLET | Freq: Three times a day (TID) | ORAL | 1 refills | Status: DC | PRN

## 2020-06-09 ENCOUNTER — Telehealth: Payer: Self-pay

## 2020-06-09 ENCOUNTER — Other Ambulatory Visit: Payer: Self-pay

## 2020-06-09 DIAGNOSIS — C711 Malignant neoplasm of frontal lobe: Secondary | ICD-10-CM

## 2020-06-09 DIAGNOSIS — R17 Unspecified jaundice: Secondary | ICD-10-CM

## 2020-06-09 NOTE — Telephone Encounter (Signed)
[facility] called to state they needed to cancel referral appointment for [facility] and that he was previously seen by Dr. Juanita Craver and needs to be seen by Dr. Collene Mares. Referral made and information faxed to [facility]: Dr. Juanita Craver.

## 2020-06-09 NOTE — Telephone Encounter (Signed)
Per [name] , Dr. Collene Mares would not see Rinaldo Ratel as she was covering for [facility]. Office given this information. Request made for Dr Fuller Plan to see patient and appointment made for 06/11/20 at 9:30 am. Pt to be notified by [facility]. [name] made aware.

## 2020-06-10 ENCOUNTER — Ambulatory Visit: Payer: Self-pay | Admitting: Gastroenterology

## 2020-06-10 ENCOUNTER — Encounter: Payer: Self-pay | Admitting: Gastroenterology

## 2020-06-10 NOTE — Progress Notes (Signed)
To Dr. Caryn Section. John Watson was seen by me as a unassigned consult for [facility] over a weekend when I was covering for them. He is therefore supposed to follow up with them. I have advised your office several times about this but inspite of this the patient keeps getting directed back to our office. I wanted to make sure you are aware of this.

## 2020-06-11 ENCOUNTER — Other Ambulatory Visit (INDEPENDENT_AMBULATORY_CARE_PROVIDER_SITE_OTHER): Payer: Self-pay

## 2020-06-11 ENCOUNTER — Ambulatory Visit (INDEPENDENT_AMBULATORY_CARE_PROVIDER_SITE_OTHER): Payer: Self-pay | Admitting: Physician Assistant

## 2020-06-11 ENCOUNTER — Other Ambulatory Visit: Payer: Self-pay | Admitting: Physician Assistant

## 2020-06-11 ENCOUNTER — Encounter: Payer: Self-pay | Admitting: Physician Assistant

## 2020-06-11 VITALS — BP 100/74 | HR 100 | Ht 71.5 in | Wt 174.2 lb

## 2020-06-11 DIAGNOSIS — K219 Gastro-esophageal reflux disease without esophagitis: Secondary | ICD-10-CM

## 2020-06-11 DIAGNOSIS — R140 Abdominal distension (gaseous): Secondary | ICD-10-CM

## 2020-06-11 DIAGNOSIS — K5900 Constipation, unspecified: Secondary | ICD-10-CM

## 2020-06-11 DIAGNOSIS — R7989 Other specified abnormal findings of blood chemistry: Secondary | ICD-10-CM

## 2020-06-11 DIAGNOSIS — R142 Eructation: Secondary | ICD-10-CM

## 2020-06-11 LAB — CBC WITH DIFFERENTIAL/PLATELET
Absolute Monocytes: 912 cells/uL (ref 200–950)
Basophils Absolute: 72 cells/uL (ref 0–200)
Basophils Relative: 0.6 %
Eosinophils Absolute: 72 cells/uL (ref 15–500)
Eosinophils Relative: 0.6 %
HCT: 32 % — ABNORMAL LOW (ref 38.5–50.0)
Hemoglobin: 11 g/dL — ABNORMAL LOW (ref 13.2–17.1)
Lymphs Abs: 876 cells/uL (ref 850–3900)
MCH: 31 pg (ref 27.0–33.0)
MCHC: 34.4 g/dL (ref 32.0–36.0)
MCV: 90.1 fL (ref 80.0–100.0)
MPV: 11.2 fL (ref 7.5–12.5)
Monocytes Relative: 7.6 %
Neutro Abs: 10068 cells/uL — ABNORMAL HIGH (ref 1500–7800)
Neutrophils Relative %: 83.9 %
Platelets: 218 10*3/uL (ref 140–400)
RBC: 3.55 10*6/uL — ABNORMAL LOW (ref 4.20–5.80)
RDW: 22.8 % — ABNORMAL HIGH (ref 11.0–15.0)
Total Lymphocyte: 7.3 %
WBC: 12 10*3/uL — ABNORMAL HIGH (ref 3.8–10.8)

## 2020-06-11 LAB — COMPREHENSIVE METABOLIC PANEL
ALT: 1116 U/L — ABNORMAL HIGH (ref 0–53)
AST: 285 U/L — ABNORMAL HIGH (ref 0–37)
Albumin: 3.5 g/dL (ref 3.5–5.2)
Alkaline Phosphatase: 597 U/L — ABNORMAL HIGH (ref 39–117)
BUN: 19 mg/dL (ref 6–23)
CO2: 26 mEq/L (ref 19–32)
Calcium: 9.1 mg/dL (ref 8.4–10.5)
Chloride: 93 mEq/L — ABNORMAL LOW (ref 96–112)
Creatinine, Ser: 1.12 mg/dL (ref 0.40–1.50)
GFR: 71.92 mL/min (ref >60.00)
Glucose, Bld: 115 mg/dL — ABNORMAL HIGH (ref 70–99)
Potassium: 3.2 mEq/L — ABNORMAL LOW (ref 3.5–5.1)
Sodium: 127 mEq/L — ABNORMAL LOW (ref 135–145)
Total Bilirubin: 31.9 mg/dL — ABNORMAL HIGH (ref 0.2–1.2)
Total Protein: 5.6 g/dL — ABNORMAL LOW (ref 6.0–8.3)

## 2020-06-11 LAB — PROTIME-INR
INR: 1.3 ratio — ABNORMAL HIGH (ref 0.8–1.0)
Prothrombin Time: 14.2 s — ABNORMAL HIGH (ref 9.6–13.1)

## 2020-06-11 LAB — IBC PANEL
Iron: 101 ug/dL (ref 42–165)
Saturation Ratios: 33.4 % (ref 20.0–50.0)
Transferrin: 216 mg/dL (ref 212.0–360.0)

## 2020-06-11 LAB — CBC MORPHOLOGY

## 2020-06-11 MED ORDER — PANTOPRAZOLE SODIUM 40 MG PO TBEC: 40.0000 mg | DELAYED_RELEASE_TABLET | Freq: Two times a day (BID) | ORAL | 5 refills | Status: DC

## 2020-06-11 NOTE — Progress Notes (Signed)
Chief Complaint: Elevated liver enzymes  HPI:    John Watson is a 60 year old African-American male with a past medical history as listed below including glioblastoma and stroke, who presents to clinic today as a referral from [name] with oncology, for complaint of elevated liver enzymes.    05/17/2020 patient consulted in the hospital by Dr. Collene Mares when she was covering our service for hyperbilirubinemia of 23.  That time it was noted patient had been diagnosed with glioblastoma of the frontal lobe on 02/13/2020 and completed 6-week course of radiation and oral antineoplastic agent-Temozolomide (completed 04/28/2020).  He had also had a previous history of seizure disorder and a prior CVA with hyperlipidemia.  He was sent to the emergency room by his oncologist after noting an elevation in total bilirubin to 22.9, AST 525 and ALT of 1857 as well as an alk phos of 449.  At that time the patient denied having abdominal pain nausea vomiting fever chills or rigors.  On CT scan of the abdomen he was noted to have several calcified gallstones with a decompressed gallbladder with no evidence of ductal dilation whatsoever.  Acute hepatitis serologies were negative.  At that time it was thought patient had elevated liver enzymes secondary to his chemotherapy.    05/17/2020 ferritin elevated at 5807.    06/05/2020 albumin 2.3, total protein 6, AST 291 (225 on 12/21), ALT 980 (1230 on 12/21), alk phos 568 (371 on 12/21), total bilirubin 29.3 (17.7 on 12/21).  CBC with a hemoglobin of 11.3 and normal platelets.    Today, the patient presents to clinic accompanied by his family member and explains that he has had trouble with elevated liver enzymes since finishing his chemotherapy and back in December when he was hospitalized.  He remains jaundiced and tells me he just feels fatigued all over.      Along with this is experiencing a lot of reflux symptoms for the past 3 weeks including a lot of belching "24/7",  bloating and gas as well as a taste of acid in his mouth most times throughout the day.  He is not on any medication for this.    Also describes that he radiates towards constipation over the past 3 weeks.  He has used Dulcolax stool softeners in the past but currently only has an off brand which does not work at all.    Denies fever, chills, abdominal pain, weight loss or blood in his stool.  Past Medical History:  Diagnosis Date  . Glioblastoma ([place]) 02/13/2020  . Pneumonia 2012  . Seizure ([place]) 8/16/211   last one 02/02/2020  . Stroke [facility])    Left leg weakness    Past Surgical History:  Procedure Laterality Date  . APPLICATION OF CRANIAL NAVIGATION Right 02/13/2020   Procedure: APPLICATION OF CRANIAL NAVIGATION;  Surgeon: [name], [name], DO;  Location: [place] [facility];  Service: Neurosurgery;  Laterality: Right;  APPLICATION OF CRANIAL NAVIGATION  . BUBBLE STUDY  01/17/2020   Procedure: BUBBLE STUDY;  Surgeon: [name], MD;  Location: [facility];  Service: Cardiovascular;;  . FRAMELESS  BIOPSY WITH BRAINLAB Right 02/13/2020   Procedure: STEREOTACTIC BRAIN BIOPSY OF RIGHT SIDED LESION;  Surgeon: [name], DO;  Location: [facility];  Service: Neurosurgery;  Laterality: Right;  STEREOTACTIC BRAIN BIOPSY OF RIGHT SIDED LESION  . LOOP RECORDER INSERTION N/A 01/17/2020   Procedure: LOOP RECORDER INSERTION;  Surgeon: [name], MD;  Location: [facility] CV LAB;  Service: Cardiovascular;  Laterality: N/A;  . TEE WITHOUT CARDIOVERSION N/A 01/17/2020   Procedure: TRANSESOPHAGEAL ECHOCARDIOGRAM (TEE);  Surgeon: [name], MD;  [facility] END[facility];  Service: Cardiovascular;  Laterality: N/A;    Current Outpatient Medications  Medication Sig Dispense Refill  . chlorproMAZINE (THORAZINE) 25 MG tablet Take 1 tablet (25 mg total) by mouth 3 (three) times daily as needed for hiccoughs. 60 tablet 1  . dexamethasone (DECADRON) 2 MG tablet Take 2 tablets (4 mg total) by  mouth daily. 30 tablet 1  . GAS RELIEF EXTRA STRENGTH 125 MG chewable tablet Chew 125 mg by mouth every 6 (six) hours as needed for flatulence.    . levETIRAcetam (KEPPRA) 1000 MG tablet Take 1 tablet (1,000 mg total) by mouth 2 (two) times daily. 60 tablet 3  . ondansetron (ZOFRAN) 8 MG tablet Take 1 tablet (8 mg total) by mouth 2 (two) times daily as needed (nausea and vomiting). May take 30-60 minutes prior to Temodar administration if nausea/vomiting occurs. (Patient taking differently: Take 8 mg by mouth 2 (two) times daily as needed (nausea and vomiting).) 30 tablet 1  . PHAZYME 125 MG chewable tablet Chew 125 mg by mouth every 6 (six) hours as needed for flatulence.     No current facility-administered medications for this visit.    Allergies as of 06/11/2020 - Review Complete 06/05/2020  Allergen Reaction Noted  . Vicodin [hydrocodone-acetaminophen] Anaphylaxis 03/06/2020    Family History  Problem Relation Age of Onset  . Hypertension Mother   . Lung cancer Father   . Hyperlipidemia Sister   . Hyperlipidemia Brother     Social History   Socioeconomic History  . Marital status: Single    Spouse name: Not on file  . Number of children: 1  . Years of education: Not on file  . Highest education level: Not on file  Occupational History  . Not on file  Tobacco Use  . Smoking status: Former Smoker    Years: 2.00    Types: Cigars    Quit date: 2002    Years since quitting: 20.0  . Smokeless tobacco: Never Used  Vaping Use  . Vaping Use: Never used  Substance and Sexual Activity  . Alcohol use: Yes    Alcohol/week: 1.0 standard drink    Types: 1 Cans of beer per week    Comment: occasionally  . Drug use: No  . Sexual activity: Yes    Birth control/protection: None  Other Topics Concern  . Not on file  Social History Narrative  . Not on file   Social Determinants of Health   Financial Resource Strain: Not on file  Food Insecurity: Not on file  Transportation  Needs: Not on file  Physical Activity: Not on file  Stress: Not on file  Social Connections: Not on file  Intimate Partner Violence: Not on file    Review of Systems:    Constitutional: No weight loss, fever or chills Skin: No rash  Cardiovascular: No chest pain  Respiratory: No SOB  Gastrointestinal: See HPI and otherwise negative Genitourinary: No dysuria Neurological: No headache Musculoskeletal: No new muscle or joint pain Hematologic: No bleeding  Psychiatric: No history of depression or anxiety   Physical Exam:  Vital signs: BP 100/74 (BP Location: Left Arm, Patient Position: Sitting, Cuff Size: Normal)   Pulse 100   Ht 5' 11.5" (1.816 m) Comment: height measured without shoes  Wt 174 lb 4 oz (79 kg)   BMI 23.96 kg/m   Constitutional:  Pleasant AA male appears to be in NAD, Well developed, Well nourished, alert and cooperative Head:  Normocephalic and atraumatic. Eyes:   PEERL, EOMI. +icterus Conjunctiva pink. Ears:  Normal auditory acuity. Neck:  Supple Throat: Oral cavity and pharynx without inflammation, swelling or lesion.  Respiratory: Respirations even and unlabored. Lungs clear to auscultation bilaterally.   No wheezes, crackles, or rhonchi.  Cardiovascular: Normal S1, S2. No MRG. Regular rate and rhythm. No peripheral edema, cyanosis or pallor.  Gastrointestinal:  Soft, nondistended, nontender. No rebound or guarding. Normal bowel sounds. No appreciable masses or hepatomegaly. Rectal:  Not performed.  Msk:  Symmetrical without gross deformities. Without edema, no deformity or joint abnormality. +ambulates with walker Neurologic:  Alert and  oriented x4;  grossly normal neurologically.  Skin:   Dry and intact without significant lesions or rashes. Psychiatric: Demonstrates good judgement and reason without abnormal affect or behaviors.  RELEVANT LABS AND IMAGING: CBC    Component Value Date/Time   WBC 9.8 06/05/2020 1145   WBC 10.9 (H) 05/20/2020 0340    RBC 3.56 (L) 06/05/2020 1145   HGB 11.3 (L) 06/05/2020 1145   HCT 33.7 (L) 06/05/2020 1145   PLT 190 06/05/2020 1145   MCV 94.7 06/05/2020 1145   MCH 31.7 06/05/2020 1145   MCHC 33.5 06/05/2020 1145   RDW 26.6 (H) 06/05/2020 1145   LYMPHSABS 0.6 (L) 06/05/2020 1145   MONOABS 0.8 06/05/2020 1145   EOSABS 0.1 06/05/2020 1145   BASOSABS 0.0 06/05/2020 1145    CMP     Component Value Date/Time   NA 132 (L) 06/05/2020 1145   K 3.2 (L) 06/05/2020 1145   CL 100 06/05/2020 1145   CO2 23 06/05/2020 1145   GLUCOSE 114 (H) 06/05/2020 1145   BUN 17 06/05/2020 1145   CREATININE 1.23 06/05/2020 1145   CALCIUM 9.3 06/05/2020 1145   PROT 6.0 (L) 06/05/2020 1145   ALBUMIN 2.3 (L) 06/05/2020 1145   AST 291 (HH) 06/05/2020 1145   ALT 980 (HH) 06/05/2020 1145   ALKPHOS 568 (H) 06/05/2020 1145   BILITOT 29.3 (HH) 06/05/2020 1145   GFRNONAA >60 06/05/2020 1145   GFRAA >60 02/14/2020 1122    Assessment: 1.  Elevated LFTs: As above and in HPI, this all started after finishing 6 weeks of chemotherapy for his glioblastoma, initially thought related to chemotherapy, but are continually elevated now 2.  Bloating/eructations/GERD: For the past 3 weeks, could be related to bile reflux and gastritis 3.  Constipation  Plan: 1.  Ordered further liver work-up to include a CBC, CMP, PT/INR, total IgA, TTG, iron studies with ferritin, PT/INR, alpha-1 antitrypsin, AMA, ANA, ASMA, ceruloplasmin, hepatitis B surface antibody, hepatitis B surface antigen, hep C antibody, hep A antibody 2.  Also ordered an MRI/MRCP.  Patient is status post pacemaker so the radiologists are checking on this to see if he can have this imaging study.  If not could consider an EUS. 3.  Prescribed Pantoprazole 40 mg twice daily, 30-60 minutes for breakfast and dinner #60 with 5 refills. 4.  Recommend the patient's start MiraLAX twice daily, discussed titration of this up to 4 times a day if necessary. 5.  Patient to follow in clinic  per recommendations after labs and imaging above.  Did discuss that he may need a liver biopsy in the near future.  John Newer, PA-C [facility] 06/11/2020, 9:20 AM

## 2020-06-11 NOTE — Progress Notes (Signed)
Attending Physician's Attestation   I have reviewed the chart.   I agree with the Advanced Practitioner's note, impression, and recommendations with any updates as below. Agree with liver biochemical testing evaluation as well as additional work-up from a hepatocellular injury perspective.  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  Liver tests remained abnormal for up to 12 months after the completion of temozolomide being stopped  It is not clear if this is the particular etiology of his symptoms but it is going to be very important for [place] to further evaluate this.  There has also been noted patients who may have hepatitis B reactivate even if they have not had hepatitis B previously noted before initiation of chemotherapy.  This is less likely because the patient has a negative hepatitis B core IgM antibody having recently been evaluated but reasonable for recheck.  MRI/MRCP to rule out PSC or changes to the bile duct and obstruction is very reasonable.  If patient's liver tests remain abnormal then in the next 1 to 2 weeks likely will need a liver biopsy to rule out Dili or other etiologies for symptoms.  Would recommend repeat hepatic function panel and INR in 1 week to ensure that he is not having progressive liver insufficiency developing.   Justice Britain, MD [facility] Advanced Endoscopy Office # [phone]

## 2020-06-11 NOTE — Patient Instructions (Addendum)
If you are age 60 or older, your body mass index should be between 23-30. Your Body mass index is 23.96 kg/m. If this is out of the aforementioned range listed, please consider follow up with your Primary Care Provider.  If you are age 84 or younger, your body mass index should be between 19-25. Your Body mass index is 23.96 kg/m. If this is out of the aformentioned range listed, please consider follow up with your Primary Care Provider.   We have sent the following medications to your pharmacy for you to pick up at your convenience: Pantoprazole 40 mg   Please start over the counter Miralax twice daily.  Your provider has requested that you go to the basement level for lab work before leaving today. Press "B" on the elevator. The lab is located at the first door on the left as you exit the elevator.  Due to recent changes in healthcare laws, you may see the results of your imaging and laboratory studies on MyChart before your provider has had a chance to review them.  We understand that in some cases there may be results that are confusing or concerning to you. Not all laboratory results come back in the same time frame and the provider may be waiting for multiple results in order to interpret others.  Please give [place] 48 hours in order for your provider to thoroughly review all the results before contacting the office for clarification of your results.   Thank you for choosing me and [facility].  Ellouise Newer, PA-C

## 2020-06-11 NOTE — Addendum Note (Signed)
Addended by: Darrall Dears on: 06/11/2020 02:10 PM   Modules accepted: Orders

## 2020-06-13 ENCOUNTER — Inpatient Hospital Stay (HOSPITAL_BASED_OUTPATIENT_CLINIC_OR_DEPARTMENT_OTHER): Payer: No Typology Code available for payment source | Admitting: Internal Medicine

## 2020-06-13 ENCOUNTER — Other Ambulatory Visit: Payer: Self-pay

## 2020-06-13 ENCOUNTER — Telehealth: Payer: Self-pay

## 2020-06-13 VITALS — BP 125/76 | HR 79 | Temp 97.3°F | Resp 17 | Ht 71.0 in | Wt 175.4 lb

## 2020-06-13 DIAGNOSIS — C711 Malignant neoplasm of frontal lobe: Secondary | ICD-10-CM | POA: Diagnosis not present

## 2020-06-13 DIAGNOSIS — R569 Unspecified convulsions: Secondary | ICD-10-CM | POA: Diagnosis not present

## 2020-06-13 DIAGNOSIS — R7989 Other specified abnormal findings of blood chemistry: Secondary | ICD-10-CM

## 2020-06-13 NOTE — Progress Notes (Signed)
[facility] at [place], [place] [address] [phone]   Interval Evaluation  Date of Service: 06/13/20 Patient Name: John Watson Patient MRN: [id number] Patient DOB: Aug 13, 1960 Provider: Ventura Sellers, MD  Identifying Statement:  [name] is a 60 y.o. male with right frontal glioblastoma  Referring Provider: [name], [place] [place],  [place] [address]  Oncologic History: 02/13/20: Sterotactic biopsy with Dr. Reatha Armour; path demonstrates glioblastoma 04/28/20: Completes radiation and concurrent Temozolomide 05/16/20: Hospitalized with jaundice and severe transaminitis  Biomarkers:  MGMT Unknown.  IDH 1/2 Wild type.  EGFR Unknown  TERT Unknown   Interval History:  [name] present today for follow up after recent GI consultation.  He continues to complain of left sided weakness, dragging the left leg, as well as sensory "heaviness" affecting the leg, unchanged from prior.  Does not feel that the higher dose of decadron has helped his functional deficits at all.  He also describes steady fatigue, lethargy, ill feeling c/w the jaundice.  Decadron is currently dosed at only 1m daily.  GI is planning extensive workup for transaminitis including MRCP, possible liver biopsy.  No seizures or headaches.   H+P (03/06/20) Patient presented to medical attention in mid-September after experiencing first ever seizures.  Events were described as mainly left sided shaking with post-seizure weakness.  CNS imaging demonstrated enhancing mass in the right frontal lobe consistent with primary brain tumor.  He underwent biopsy with Dr. DReatha Armouron 02/13/20; path was sent out and ultimately confirmed glioblastoma.  Today he complains of increasing weakness of the left arm and leg.  He is walking a little on his own, but for the most part is requiring support to ambulate from family or walker.  His left hand he  is not using as much, but he is particularly weak at the left shoulder.  This has worsened somewhat since surgery.  Otherwise no recurrence of seizure episodes, maintains compliance with Keppra.  Not dosing decadron currently.  Medications: Current Outpatient Medications on File Prior to Visit  Medication Sig Dispense Refill  . chlorproMAZINE (THORAZINE) 25 MG tablet Take 1 tablet (25 mg total) by mouth 3 (three) times daily as needed for hiccoughs. 60 tablet 1  . dexamethasone (DECADRON) 2 MG tablet Take 2 tablets (4 mg total) by mouth daily. 30 tablet 1  . GAS RELIEF EXTRA STRENGTH 125 MG chewable tablet Chew 125 mg by mouth every 6 (six) hours as needed for flatulence.    . levETIRAcetam (KEPPRA) 1000 MG tablet Take 1 tablet (1,000 mg total) by mouth 2 (two) times daily. 60 tablet 3  . ondansetron (ZOFRAN) 8 MG tablet Take 1 tablet (8 mg total) by mouth 2 (two) times daily as needed (nausea and vomiting). May take 30-60 minutes prior to Temodar administration if nausea/vomiting occurs. (Patient not taking: Reported on 06/11/2020) 30 tablet 1  . pantoprazole (PROTONIX) 40 MG tablet Take 1 tablet (40 mg total) by mouth 2 (two) times daily. Take 30-60 minutes before breakfast and dinner 60 tablet 5  . PHAZYME 125 MG chewable tablet Chew 125 mg by mouth every 6 (six) hours as needed for flatulence.     No current facility-administered medications on file prior to visit.    Allergies:  Allergies  Allergen Reactions  . Vicodin [Hydrocodone-Acetaminophen] Anaphylaxis   Past Medical History:  Past Medical History:  Diagnosis Date  . Glioblastoma ([facility] 02/13/2020  .  Pneumonia 2012  . Seizure ([place]) 8/16/211   last one 02/02/2020  . Stroke [facility])    Left leg weakness   Past Surgical History:  Past Surgical History:  Procedure Laterality Date  . APPLICATION OF CRANIAL NAVIGATION Right 02/13/2020   Procedure: APPLICATION OF CRANIAL NAVIGATION;  Surgeon: [name], [name], DO;  Location: [facility];   Service: Neurosurgery;  Laterality: Right;  APPLICATION OF CRANIAL NAVIGATION  . BUBBLE STUDY  01/17/2020   Procedure: BUBBLE STUDY;  Surgeon: [name], MD;  Location: [facility] [place];  Service: Cardiovascular;;  . FRAMELESS  BIOPSY WITH BRAINLAB Right 02/13/2020   Procedure: STEREOTACTIC BRAIN BIOPSY OF RIGHT SIDED LESION;  Surgeon: [name], DO;  Location: [facility];  Service: Neurosurgery;  Laterality: Right;  STEREOTACTIC BRAIN BIOPSY OF RIGHT SIDED LESION  . LOOP RECORDER INSERTION N/A 01/17/2020   Procedure: LOOP RECORDER INSERTION;  Surgeon: [name], MD;  Location: [place] [place] CV LAB;  Service: Cardiovascular;  Laterality: N/A;  . TEE WITHOUT CARDIOVERSION N/A 01/17/2020   Procedure: TRANSESOPHAGEAL ECHOCARDIOGRAM (TEE);  Surgeon: [name], MD;  Location: [facility] ENDOSCOPY;  Service: Cardiovascular;  Laterality: N/A;   Social History:  Social History   Socioeconomic History  . Marital status: Single    Spouse name: Not on file  . Number of children: 1  . Years of education: Not on file  . Highest education level: Not on file  Occupational History  . Not on file  Tobacco Use  . Smoking status: Former Smoker    Years: 2.00    Types: Cigars    Quit date: 2002    Years since quitting: 20.0  . Smokeless tobacco: Never Used  Vaping Use  . Vaping Use: Never used  Substance and Sexual Activity  . Alcohol use: Not Currently  . Drug use: No  . Sexual activity: Yes    Birth control/protection: None  Other Topics Concern  . Not on file  Social History Narrative  . Not on file   Social Determinants of Health   Financial Resource Strain: Not on file  Food Insecurity: Not on file  Transportation Needs: Not on file  Physical Activity: Not on file  Stress: Not on file  Social Connections: Not on file  Intimate Partner Violence: Not on file   Family History:  Family History  Problem Relation Age of Onset  . Hypertension Mother   . Diabetes  Mother   . Lung cancer Father   . Hypertension Sister   . Prostate cancer Brother   . Prostate cancer Paternal Grandfather   . Hypertension Brother   . Hypertension Brother   . Hyperlipidemia Brother     Review of Systems: Constitutional: ++weight loss Eyes: Doesn't report blurriness of vision Ears, nose, mouth, throat, and face: Doesn't report sore throat Respiratory: Doesn't report cough, dyspnea or wheezes Cardiovascular: Doesn't report palpitation, chest discomfort  Gastrointestinal:  Doesn't report nausea, constipation, diarrhea GU: Doesn't report incontinence Skin: Doesn't report skin rashes Neurological: Per HPI Musculoskeletal: Doesn't report joint pain Behavioral/Psych: Doesn't report anxiety  Physical Exam: Vitals:   06/13/20 1139  BP: 125/76  Pulse: 79  Resp: 17  Temp: (!) 97.3 F (36.3 C)  SpO2: 100%   KPS: 70. General: Jaundiced appearance Head: Normal EENT: No conjunctival injection or scleral icterus.  Lungs: Resp effort normal Cardiac: Regular rate Abdomen: Non-distended abdomen Skin: No rashes cyanosis or petechiae. Extremities: No clubbing or edema  Neurologic Exam: Mental Status: Awake, alert, attentive to examiner.  Oriented to self and environment. Language is fluent with intact comprehension.  Cranial Nerves: Visual acuity is grossly normal. Visual fields are full. Extra-ocular movements intact. No ptosis. Face is symmetric Motor: Tone and bulk are normal. Power is 4/5 in left arm and leg, proximal>distal weakness in arm. Reflexes are symmetric, no pathologic reflexes present.  Sensory: Intact to light touch Gait: Hemiparetic  Labs: I have reviewed the data as listed    Component Value Date/Time   NA 127 (L) 06/11/2020 1034   K 3.2 (L) 06/11/2020 1034   CL 93 (L) 06/11/2020 1034   CO2 26 06/11/2020 1034   GLUCOSE 115 (H) 06/11/2020 1034   BUN 19 06/11/2020 1034   CREATININE 1.12 06/11/2020 1034   CREATININE 1.23 06/05/2020 1145    CALCIUM 9.1 06/11/2020 1034   PROT 5.6 (L) 06/11/2020 1034   ALBUMIN 3.5 06/11/2020 1034   AST 285 (H) 06/11/2020 1034   AST 291 (HH) 06/05/2020 1145   ALT 1,116 (H) 06/11/2020 1034   ALT 980 (HH) 06/05/2020 1145   ALKPHOS 597 (H) 06/11/2020 1034   BILITOT 31.9 (H) 06/11/2020 1034   BILITOT 29.3 (HH) 06/05/2020 1145   GFRNONAA >60 06/05/2020 1145   GFRAA >60 02/14/2020 1122   Lab Results  Component Value Date   WBC 12.0 (H) 06/11/2020   NEUTROABS 10,068 (H) 06/11/2020   HGB 11.0 (L) 06/11/2020   HCT 32.0 (L) 06/11/2020   MCV 90.1 06/11/2020   PLT 218 06/11/2020     Assessment/Plan Glioblastoma of frontal lobe (HCC) [C71.1]  MCDONALD REILING is not clinically improved today despite recent bump in corticosteroid dosage.  Continues to express focal motor symptoms on the left, walks with a walker.  Extensive workup is in place for liver dysfunction through Dr. Dionicio Stall, including MRCP and possible biopsy.  We are supportive of this workup, as plans for further anti-tumor therapy will be on hold until this is improved or resolved.   He is not a candidate for chemotherapy until hepatobiliary issue is addressed and resolved.   He will continue to investigate Opt[employer]/TTF and give [place] a call if interested.   Decadron will be decreased to 52m daily due to lack of improvement in functional status.  We ask that MJonetta Speakfollow up with u[place]again in one week via phone for further titration of steroids, goals of care discussion.  All questions were answered. The patient knows to call the clinic with any problems, questions or concerns. No barriers to learning were detected.  The total time spent in the encounter was 30 minutes and more than 50% was on counseling and review of test results   ZVentura Sellers MD Medical Director of Neuro-Oncology [facility]01/14/22

## 2020-06-13 NOTE — Telephone Encounter (Signed)
Called patient to ask if he could come in for repeat labs next week. Patient said he could come sometime next week to complete labs

## 2020-06-13 NOTE — Addendum Note (Signed)
Addended by: Darrall Dears on: 06/13/2020 09:09 AM   Modules accepted: Orders

## 2020-06-15 LAB — ANA: Anti Nuclear Antibody (ANA): NEGATIVE

## 2020-06-15 LAB — HEPATITIS A ANTIBODY, TOTAL: Hepatitis A AB,Total: NONREACTIVE

## 2020-06-15 LAB — MITOCHONDRIAL ANTIBODIES: Mitochondrial M2 Ab, IgG: <=20 U

## 2020-06-15 LAB — TISSUE TRANSGLUTAMINASE ABS,IGG,IGA
(tTG) Ab, IgA: <1 U/mL
(tTG) Ab, IgG: <1 U/mL

## 2020-06-15 LAB — HEPATITIS B SURFACE ANTIGEN: Hepatitis B Surface Ag: NONREACTIVE

## 2020-06-15 LAB — HEPATITIS C ANTIBODY
Hepatitis C Ab: NONREACTIVE
SIGNAL TO CUT-OFF: 0.01 (ref <1.00)

## 2020-06-15 LAB — ANTI-SMOOTH MUSCLE ANTIBODY, IGG: Actin (Smooth Muscle) Antibody (IGG): <20 U (ref <20)

## 2020-06-15 LAB — IGG: IgG (Immunoglobin G), Serum: 560 mg/dL — ABNORMAL LOW (ref 600–1640)

## 2020-06-15 LAB — CERULOPLASMIN: Ceruloplasmin: <2 mg/dL — ABNORMAL LOW (ref 18–36)

## 2020-06-15 LAB — ALPHA-1-ANTITRYPSIN: A-1 Antitrypsin, Ser: 176 mg/dL (ref 83–199)

## 2020-06-15 LAB — IGA: Immunoglobulin A: 230 mg/dL (ref 47–310)

## 2020-06-15 LAB — HEPATITIS B SURFACE ANTIBODY,QUALITATIVE: Hep B S Ab: REACTIVE — AB

## 2020-06-16 ENCOUNTER — Telehealth: Payer: Self-pay | Admitting: Radiation Oncology

## 2020-06-16 NOTE — Telephone Encounter (Signed)
Opened in error

## 2020-06-16 NOTE — Progress Notes (Signed)
Carelink Summary Report / Loop Recorder 

## 2020-06-17 ENCOUNTER — Other Ambulatory Visit: Payer: Self-pay

## 2020-06-17 ENCOUNTER — Ambulatory Visit: Payer: Self-pay | Admitting: Gastroenterology

## 2020-06-17 DIAGNOSIS — R7989 Other specified abnormal findings of blood chemistry: Secondary | ICD-10-CM

## 2020-06-17 MED ORDER — PHYTONADIONE 5 MG PO TABS: 10.0000 mg | ORAL_TABLET | Freq: Every day | ORAL | 0 refills | Status: DC

## 2020-06-20 ENCOUNTER — Inpatient Hospital Stay (HOSPITAL_BASED_OUTPATIENT_CLINIC_OR_DEPARTMENT_OTHER): Payer: No Typology Code available for payment source | Admitting: Internal Medicine

## 2020-06-20 ENCOUNTER — Other Ambulatory Visit: Payer: Self-pay

## 2020-06-20 ENCOUNTER — Other Ambulatory Visit (INDEPENDENT_AMBULATORY_CARE_PROVIDER_SITE_OTHER): Payer: No Typology Code available for payment source

## 2020-06-20 VITALS — BP 141/83 | HR 104 | Temp 98.1°F | Resp 109 | Ht 71.0 in | Wt 175.3 lb

## 2020-06-20 DIAGNOSIS — R569 Unspecified convulsions: Secondary | ICD-10-CM

## 2020-06-20 DIAGNOSIS — R142 Eructation: Secondary | ICD-10-CM | POA: Diagnosis not present

## 2020-06-20 DIAGNOSIS — K219 Gastro-esophageal reflux disease without esophagitis: Secondary | ICD-10-CM

## 2020-06-20 DIAGNOSIS — C711 Malignant neoplasm of frontal lobe: Secondary | ICD-10-CM | POA: Diagnosis not present

## 2020-06-20 DIAGNOSIS — R140 Abdominal distension (gaseous): Secondary | ICD-10-CM

## 2020-06-20 DIAGNOSIS — R7989 Other specified abnormal findings of blood chemistry: Secondary | ICD-10-CM

## 2020-06-20 DIAGNOSIS — K5900 Constipation, unspecified: Secondary | ICD-10-CM

## 2020-06-20 LAB — HEPATIC FUNCTION PANEL
ALT: 544 U/L — ABNORMAL HIGH (ref 0–53)
AST: 124 U/L — ABNORMAL HIGH (ref 0–37)
Albumin: 3.4 g/dL — ABNORMAL LOW (ref 3.5–5.2)
Alkaline Phosphatase: 466 U/L — ABNORMAL HIGH (ref 39–117)
Bilirubin, Direct: 17 mg/dL — ABNORMAL HIGH (ref 0.0–0.3)
Total Bilirubin: 27.5 mg/dL — ABNORMAL HIGH (ref 0.2–1.2)
Total Protein: 5.5 g/dL — ABNORMAL LOW (ref 6.0–8.3)

## 2020-06-20 LAB — PROTIME-INR
INR: 1.2 ratio — ABNORMAL HIGH (ref 0.8–1.0)
Prothrombin Time: 13.3 s — ABNORMAL HIGH (ref 9.6–13.1)

## 2020-06-20 MED ORDER — DEXAMETHASONE 2 MG PO TABS: 2.0000 mg | ORAL_TABLET | Freq: Every day | ORAL | 1 refills | Status: DC

## 2020-06-20 MED FILL — MEPHYTON 5 MG TABLET: 5 | 14 days supply | Qty: 28 | Fill #0

## 2020-06-20 NOTE — Progress Notes (Signed)
[facility] at [place], [place] [address] [phone]   Interval Evaluation  Date of Service: 06/20/20 Patient Name: John Watson Patient MRN: [id number] Patient DOB: 10-19-1960 Provider: Ventura Sellers, MD  Identifying Statement:  John Watson is a 60 y.o. male with right frontal glioblastoma  Referring Provider: [name], [place],   [id number]  Oncologic History: 02/13/20: Sterotactic biopsy with Dr. Reatha Armour; path demonstrates glioblastoma 04/28/20: Completes radiation and concurrent Temozolomide 05/16/20: Hospitalized with jaundice and severe transaminitis  Biomarkers:  MGMT Unknown.  IDH 1/2 Wild type.  EGFR Unknown  TERT Unknown   Interval History:  John Watson present today for follow up.  Maybe very modest progression of left sided weakness, dragging the left leg, as well as sensory "heaviness" affecting the leg.  Overall he feels comfortable with the 66m dose of decadron, but he is developing some ankle swelling.  Continues to describe steady fatigue, lethargy, ill feeling c/w the jaundice.  GI is planning MRCP next monday, possible liver biopsy shortly thereafter.  No seizures or headaches.   H+P (03/06/20) Patient presented to medical attention in mid-September after experiencing first ever seizures.  Events were described as mainly left sided shaking with post-seizure weakness.  CNS imaging demonstrated enhancing mass in the right frontal lobe consistent with primary brain tumor.  He underwent biopsy with Dr. DReatha Armouron 02/13/20; path was sent out and ultimately confirmed glioblastoma.  Today he complains of increasing weakness of the left arm and leg.  He is walking a little on his own, but for the most part is requiring support to ambulate from family or walker.  His left hand he is not using as much, but he is particularly weak at the left shoulder.  This has worsened  somewhat since surgery.  Otherwise no recurrence of seizure episodes, maintains compliance with Keppra.  Not dosing decadron currently.  Medications: Current Outpatient Medications on File Prior to Visit  Medication Sig Dispense Refill  . chlorproMAZINE (THORAZINE) 25 MG tablet Take 1 tablet (25 mg total) by mouth 3 (three) times daily as needed for hiccoughs. 60 tablet 1  . dexamethasone (DECADRON) 2 MG tablet Take 2 tablets (4 mg total) by mouth daily. 30 tablet 1  . GAS RELIEF EXTRA STRENGTH 125 MG chewable tablet Chew 125 mg by mouth every 6 (six) hours as needed for flatulence.    . levETIRAcetam (KEPPRA) 1000 MG tablet Take 1 tablet (1,000 mg total) by mouth 2 (two) times daily. 60 tablet 3  . ondansetron (ZOFRAN) 8 MG tablet Take 1 tablet (8 mg total) by mouth 2 (two) times daily as needed (nausea and vomiting). May take 30-60 minutes prior to Temodar administration if nausea/vomiting occurs. (Patient not taking: Reported on 06/11/2020) 30 tablet 1  . pantoprazole (PROTONIX) 40 MG tablet Take 1 tablet (40 mg total) by mouth 2 (two) times daily. Take 30-60 minutes before breakfast and dinner 60 tablet 5  . PHAZYME 125 MG chewable tablet Chew 125 mg by mouth every 6 (six) hours as needed for flatulence.    . phytonadione (VITAMIN K) 5 MG tablet Take 2 tablets (10 mg total) by mouth daily for 14 doses. 28 tablet 0   No current facility-administered medications on file prior to visit.    Allergies:  Allergies  Allergen Reactions  . Vicodin [Hydrocodone-Acetaminophen] Anaphylaxis   Past Medical History:  Past Medical History:  Diagnosis Date  .  Glioblastoma ([place]) 02/13/2020  . Pneumonia 2012  . Seizure ([facility] [facility]) 8/16/211   last one 02/02/2020  . Stroke [facility])    Left leg weakness   Past Surgical History:  Past Surgical History:  Procedure Laterality Date  . APPLICATION OF CRANIAL NAVIGATION Right 02/13/2020   Procedure: APPLICATION OF CRANIAL NAVIGATION;  Surgeon: [name], [name], DO;   Location: [place];  Service: Neurosurgery;  Laterality: Right;  APPLICATION OF CRANIAL NAVIGATION  . BUBBLE STUDY  01/17/2020   Procedure: BUBBLE STUDY;  Surgeon: [name], MD;  Location: [facility];  Service: Cardiovascular;;  . FRAMELESS  BIOPSY WITH BRAINLAB Right 02/13/2020   Procedure: STEREOTACTIC BRAIN BIOPSY OF RIGHT SIDED LESION;  Surgeon: [name], DO;  Location: [place];  Service: Neurosurgery;  Laterality: Right;  STEREOTACTIC BRAIN BIOPSY OF RIGHT SIDED LESION  . LOOP RECORDER INSERTION N/A 01/17/2020   Procedure: LOOP RECORDER INSERTION;  Surgeon: [name], MD;  Location: [place] CV LAB;  Service: Cardiovascular;  Laterality: N/A;  . TEE WITHOUT CARDIOVERSION N/A 01/17/2020   Procedure: TRANSESOPHAGEAL ECHOCARDIOGRAM (TEE);  Surgeon: [name], MD;  Location: [facility] ENDOSCOPY;  Service: Cardiovascular;  Laterality: N/A;   Social History:  Social History   Socioeconomic History  . Marital status: Single    Spouse name: Not on file  . Number of children: 1  . Years of education: Not on file  . Highest education level: Not on file  Occupational History  . Not on file  Tobacco Use  . Smoking status: Former Smoker    Years: 2.00    Types: Cigars    Quit date: 2002    Years since quitting: 20.0  . Smokeless tobacco: Never Used  Vaping Use  . Vaping Use: Never used  Substance and Sexual Activity  . Alcohol use: Not Currently  . Drug use: No  . Sexual activity: Yes    Birth control/protection: None  Other Topics Concern  . Not on file  Social History Narrative  . Not on file   Social Determinants of Health   Financial Resource Strain: Not on file  Food Insecurity: Not on file  Transportation Needs: Not on file  Physical Activity: Not on file  Stress: Not on file  Social Connections: Not on file  Intimate Partner Violence: Not on file   Family History:  Family History  Problem Relation Age of Onset  . Hypertension Mother    . Diabetes Mother   . Lung cancer Father   . Hypertension Sister   . Prostate cancer Brother   . Prostate cancer Paternal Grandfather   . Hypertension Brother   . Hypertension Brother   . Hyperlipidemia Brother     Review of Systems: Constitutional: ++weight loss Eyes: Doesn't report blurriness of vision Ears, nose, mouth, throat, and face: Doesn't report sore throat Respiratory: Doesn't report cough, dyspnea or wheezes Cardiovascular: Doesn't report palpitation, chest discomfort  Gastrointestinal:  Doesn't report nausea, constipation, diarrhea GU: Doesn't report incontinence Skin: Doesn't report skin rashes Neurological: Per HPI Musculoskeletal: Doesn't report joint pain Behavioral/Psych: Doesn't report anxiety  Physical Exam: Vitals:   06/20/20 1138  BP: (!) 141/83  Pulse: (!) 104  Resp: (!) 109  Temp: 98.1 F (36.7 C)  SpO2: 100%   KPS: 70. General: Jaundiced appearance Head: Normal EENT: No conjunctival injection or scleral icterus.  Lungs: Resp effort normal Cardiac: Regular rate Abdomen: Non-distended abdomen Skin: No rashes cyanosis or petechiae. Extremities: No clubbing or edema  Neurologic Exam:  Mental Status: Awake, alert, attentive to examiner. Oriented to self and environment. Language is fluent with intact comprehension.  Cranial Nerves: Visual acuity is grossly normal. Visual fields are full. Extra-ocular movements intact. No ptosis. Face is symmetric Motor: Tone and bulk are normal. Power is 4/5 in left arm and leg, proximal>distal weakness in arm. Reflexes are symmetric, no pathologic reflexes present.  Sensory: Intact to light touch Gait: Hemiparetic  Labs: I have reviewed the data as listed    Component Value Date/Time   NA 127 (L) 06/11/2020 1034   K 3.2 (L) 06/11/2020 1034   CL 93 (L) 06/11/2020 1034   CO2 26 06/11/2020 1034   GLUCOSE 115 (H) 06/11/2020 1034   BUN 19 06/11/2020 1034   CREATININE 1.12 06/11/2020 1034   CREATININE 1.23  06/05/2020 1145   CALCIUM 9.1 06/11/2020 1034   PROT 5.6 (L) 06/11/2020 1034   ALBUMIN 3.5 06/11/2020 1034   AST 285 (H) 06/11/2020 1034   AST 291 (HH) 06/05/2020 1145   ALT 1,116 (H) 06/11/2020 1034   ALT 980 (HH) 06/05/2020 1145   ALKPHOS 597 (H) 06/11/2020 1034   BILITOT 31.9 (H) 06/11/2020 1034   BILITOT 29.3 (HH) 06/05/2020 1145   GFRNONAA >60 06/05/2020 1145   GFRAA >60 02/14/2020 1122   Lab Results  Component Value Date   WBC 12.0 (H) 06/11/2020   NEUTROABS 10,068 (H) 06/11/2020   HGB 11.0 (L) 06/11/2020   HCT 32.0 (L) 06/11/2020   MCV 90.1 06/11/2020   PLT 218 06/11/2020     Assessment/Plan Glioblastoma of frontal lobe ([place]) [C71.1]  John Watson is clinically stable today.  Continues to express focal motor symptoms on the left, walks with a walker.  Extensive workup is in place for liver dysfunction through Dr. Dionicio Stall, including MRCP and possible biopsy.  He has not yet started his vitamin k supplementation, and we encouraged him to do so as directed.  His brother will help out with managing his appointments, procedures, medications given cognitive impairment.  He is not a candidate for chemotherapy until hepatobiliary issue is addressed and resolved.   Decadron will be continued at 42m daily given ongoing motor dysfunction.   We ask that John Speakfollow up with u[place]again after brain MRI in 1 month, or sooner if needed.  We will continue to touch base with Dr. MDionicio Stallregarding hepatic issues.  All questions were answered. The patient knows to call the clinic with any problems, questions or concerns. No barriers to learning were detected.  The total time spent in the encounter was 30 minutes and more than 50% was on counseling and review of test results   ZVentura Sellers MD Medical Director of Neuro-Oncology [facility]01/21/22

## 2020-06-23 ENCOUNTER — Ambulatory Visit (HOSPITAL_COMMUNITY)
Admission: RE | Admit: 2020-06-23 | Discharge: 2020-06-23 | Disposition: A | Discharge status: Home / Self Care | Payer: No Typology Code available for payment source | Source: Ambulatory Visit | Attending: Physician Assistant | Admitting: Physician Assistant

## 2020-06-23 ENCOUNTER — Other Ambulatory Visit: Payer: Self-pay | Admitting: Physician Assistant

## 2020-06-23 ENCOUNTER — Other Ambulatory Visit: Payer: Self-pay

## 2020-06-23 DIAGNOSIS — R7989 Other specified abnormal findings of blood chemistry: Secondary | ICD-10-CM | POA: Insufficient documentation

## 2020-06-23 DIAGNOSIS — R140 Abdominal distension (gaseous): Secondary | ICD-10-CM | POA: Diagnosis present

## 2020-06-23 DIAGNOSIS — K219 Gastro-esophageal reflux disease without esophagitis: Secondary | ICD-10-CM | POA: Insufficient documentation

## 2020-06-23 DIAGNOSIS — R142 Eructation: Secondary | ICD-10-CM | POA: Insufficient documentation

## 2020-06-23 LAB — IGM: IgM, Serum: 59 mg/dL (ref 50–300)

## 2020-06-23 LAB — HEPATITIS B CORE ANTIBODY, TOTAL: Hep B Core Total Ab: NONREACTIVE

## 2020-06-23 LAB — HEPATITIS B CORE ANTIBODY, IGM: Hep B C IgM: NONREACTIVE

## 2020-06-23 LAB — IGE: IgE (Immunoglobulin E), Serum: 21 kU/L (ref <=114)

## 2020-06-23 MED ORDER — GADOBUTROL 1 MMOL/ML IV SOLN
8.0000 mL | Freq: Once | INTRAVENOUS | Status: AC | PRN
  Administered 2020-06-23: 8 mL via INTRAVENOUS

## 2020-06-23 MED FILL — PANTOPRAZOLE SOD DR 40 MG T: 40 | 30 days supply | Qty: 60 | Fill #0

## 2020-06-25 ENCOUNTER — Other Ambulatory Visit: Payer: Self-pay

## 2020-06-25 DIAGNOSIS — R7989 Other specified abnormal findings of blood chemistry: Secondary | ICD-10-CM

## 2020-06-26 ENCOUNTER — Telehealth (HOSPITAL_COMMUNITY): Payer: Self-pay

## 2020-06-26 NOTE — Progress Notes (Signed)
  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  The patient then received a 8 Gy boost to yield a final dose of 54 Gy, followed by a second 6 Gy boost to yield a final total dose to this lesion of 60 [place].  A second lesion was also treated concurrently due to its proximity to the GBM.  Imaging characteristics were consistent with a low-grade glioma.  This therefore was treated to [place] concurrently with the above treatment and is the reason for to boost treatments being given with the second boost treating only the GBM for the final [place].  Narrative: The patient tolerated radiation treatment relatively well.     Plan: The patient has completed radiation treatment. The patient will return to radiation oncology clinic for routine followup in one month. I advised the patient to call or return sooner if they have any questions or concerns related to their recovery or treatment. ________________________________  Jodelle Gross, M.D., Ph.D.

## 2020-06-27 ENCOUNTER — Other Ambulatory Visit: Payer: Self-pay | Admitting: Radiation Therapy

## 2020-06-27 ENCOUNTER — Other Ambulatory Visit (INDEPENDENT_AMBULATORY_CARE_PROVIDER_SITE_OTHER): Payer: No Typology Code available for payment source

## 2020-06-27 ENCOUNTER — Other Ambulatory Visit: Payer: Self-pay

## 2020-06-27 ENCOUNTER — Encounter: Payer: Self-pay | Admitting: Internal Medicine

## 2020-06-27 ENCOUNTER — Telehealth: Payer: Self-pay | Admitting: Gastroenterology

## 2020-06-27 DIAGNOSIS — R7989 Other specified abnormal findings of blood chemistry: Secondary | ICD-10-CM

## 2020-06-27 DIAGNOSIS — R140 Abdominal distension (gaseous): Secondary | ICD-10-CM

## 2020-06-27 LAB — HEPATIC FUNCTION PANEL
ALT: 296 U/L — ABNORMAL HIGH (ref 0–53)
AST: 84 U/L — ABNORMAL HIGH (ref 0–37)
Albumin: 3.2 g/dL — ABNORMAL LOW (ref 3.5–5.2)
Alkaline Phosphatase: 391 U/L — ABNORMAL HIGH (ref 39–117)
Bilirubin, Direct: 12.4 mg/dL — ABNORMAL HIGH (ref 0.0–0.3)
Total Bilirubin: 26.5 mg/dL — ABNORMAL HIGH (ref 0.2–1.2)
Total Protein: 5.3 g/dL — ABNORMAL LOW (ref 6.0–8.3)

## 2020-06-27 LAB — MONONUCLEOSIS SCREEN: Mono Screen: NEGATIVE

## 2020-06-27 NOTE — Telephone Encounter (Signed)
Chris from [facility] is requesting a call back from a nurse in regards to the Herpes simplex virus culture.  [phone] [place]

## 2020-06-27 NOTE — Telephone Encounter (Signed)
I spoke with Gerald Stabs and he states the pt has an order for HSV culture and they do not swab.  I have added the blood draw to the labs already collected.  Add on order faxed to the lab.

## 2020-06-27 NOTE — Telephone Encounter (Signed)
Joey if they call back can you get a phone number.  The number they gave you is a fax number.  Thanks

## 2020-06-30 ENCOUNTER — Other Ambulatory Visit: Payer: Self-pay

## 2020-06-30 DIAGNOSIS — R7989 Other specified abnormal findings of blood chemistry: Secondary | ICD-10-CM

## 2020-07-01 ENCOUNTER — Other Ambulatory Visit: Payer: Self-pay | Admitting: Radiology

## 2020-07-01 ENCOUNTER — Other Ambulatory Visit (HOSPITAL_COMMUNITY): Payer: Self-pay | Admitting: Student

## 2020-07-01 LAB — HSV DNA BY PCR (REFERENCE LAB)
HSV 2 DNA: NEGATIVE
HSV-1 DNA: NEGATIVE

## 2020-07-01 LAB — CYTOMEGALOVIRUS ANTIBODY, IGG: Cytomegalovirus Ab-IgG: <0.6 U/mL

## 2020-07-02 ENCOUNTER — Other Ambulatory Visit: Payer: Self-pay

## 2020-07-02 ENCOUNTER — Encounter (HOSPITAL_COMMUNITY): Payer: Self-pay

## 2020-07-02 ENCOUNTER — Ambulatory Visit (HOSPITAL_COMMUNITY)
Admission: RE | Admit: 2020-07-02 | Discharge: 2020-07-02 | Disposition: A | Discharge status: Home / Self Care | Payer: No Typology Code available for payment source | Source: Ambulatory Visit | Attending: Gastroenterology | Admitting: Gastroenterology

## 2020-07-02 ENCOUNTER — Telehealth (HOSPITAL_COMMUNITY): Payer: Self-pay

## 2020-07-02 DIAGNOSIS — C719 Malignant neoplasm of brain, unspecified: Secondary | ICD-10-CM | POA: Diagnosis not present

## 2020-07-02 DIAGNOSIS — K831 Obstruction of bile duct: Secondary | ICD-10-CM | POA: Insufficient documentation

## 2020-07-02 DIAGNOSIS — Z79899 Other long term (current) drug therapy: Secondary | ICD-10-CM | POA: Diagnosis not present

## 2020-07-02 DIAGNOSIS — Z87891 Personal history of nicotine dependence: Secondary | ICD-10-CM | POA: Diagnosis not present

## 2020-07-02 DIAGNOSIS — K7589 Other specified inflammatory liver diseases: Secondary | ICD-10-CM | POA: Diagnosis not present

## 2020-07-02 DIAGNOSIS — Z8673 Personal history of transient ischemic attack (TIA), and cerebral infarction without residual deficits: Secondary | ICD-10-CM | POA: Insufficient documentation

## 2020-07-02 DIAGNOSIS — R7989 Other specified abnormal findings of blood chemistry: Secondary | ICD-10-CM | POA: Diagnosis present

## 2020-07-02 LAB — PROTIME-INR
INR: 0.9 (ref 0.8–1.2)
Prothrombin Time: 12.2 seconds (ref 11.4–15.2)

## 2020-07-02 LAB — CBC
HCT: 32.5 % — ABNORMAL LOW (ref 39.0–52.0)
Hemoglobin: 11.3 g/dL — ABNORMAL LOW (ref 13.0–17.0)
MCH: 34.5 pg — ABNORMAL HIGH (ref 26.0–34.0)
MCHC: 34.8 g/dL (ref 30.0–36.0)
MCV: 99.1 fL (ref 80.0–100.0)
Platelets: 198 10*3/uL (ref 150–400)
RBC: 3.28 MIL/uL — ABNORMAL LOW (ref 4.22–5.81)
RDW: 20.9 % — ABNORMAL HIGH (ref 11.5–15.5)
WBC: 7.9 10*3/uL (ref 4.0–10.5)
nRBC: 0.3 % — ABNORMAL HIGH (ref 0.0–0.2)

## 2020-07-02 LAB — CUP PACEART REMOTE DEVICE CHECK
Date Time Interrogation Session: 20220201225745
Implantable Pulse Generator Implant Date: 20210819

## 2020-07-02 MED ORDER — FENTANYL CITRATE (PF) 100 MCG/2ML IJ SOLN
INTRAMUSCULAR | Status: AC
  Filled 2020-07-02: qty 2

## 2020-07-02 MED ORDER — MIDAZOLAM HCL 2 MG/2ML IJ SOLN
INTRAMUSCULAR | Status: AC
  Filled 2020-07-02: qty 2

## 2020-07-02 MED ORDER — SODIUM CHLORIDE 0.9 % IV SOLN
INTRAVENOUS | Status: AC | PRN
  Administered 2020-07-02: 10 mL/h via INTRAVENOUS

## 2020-07-02 MED ORDER — GELATIN ABSORBABLE 12-7 MM EX MISC
CUTANEOUS | Status: AC
  Filled 2020-07-02: qty 1

## 2020-07-02 MED ORDER — MIDAZOLAM HCL 2 MG/2ML IJ SOLN
INTRAMUSCULAR | Status: AC | PRN
  Administered 2020-07-02: 1 mg via INTRAVENOUS

## 2020-07-02 MED ORDER — FENTANYL CITRATE (PF) 100 MCG/2ML IJ SOLN
INTRAMUSCULAR | Status: AC | PRN
  Administered 2020-07-02: 50 ug via INTRAVENOUS

## 2020-07-02 MED ORDER — LIDOCAINE HCL (PF) 1 % IJ SOLN
INTRAMUSCULAR | Status: AC
  Filled 2020-07-02: qty 30

## 2020-07-02 MED ORDER — SODIUM CHLORIDE 0.9 % IV SOLN: INTRAVENOUS | Status: DC

## 2020-07-02 NOTE — H&P (Signed)
Chief Complaint: Patient was seen in consultation today for random liver biopsy at the request of Mansouraty,Gabriel Jr.  Referring Physician(s): Mansouraty,Gabriel Jr.  Supervising Physician: Mir, Biochemist, clinical  Patient Status: [facility] - Out-pt  History of Present Illness: John Watson is a 60 y.o. male   Recent diagnosis glioblastoma 01/2020- after new onset seizures Follows with Dr Merrilyn Puma Noted jaundice in pt and scleral icterus Elevated liver enzymes; transaminitis Otherwise pt asymptomatic Not a candidate for chemo therapy until hepatobiliary issues addressed--- per Oncology  MR 06/23/20: IMPRESSION: 1. No evidence of biliary obstruction. No intrahepatic or extrahepatic duct dilatation. Normal common bile duct. 2. Gallbladder collapsed. 3. No hepatic parenchymal abnormality other than multiple small benign hepatic cysts. 4. Normal pancreas. 5. No explanation for elevated bilirubin.  Scheduled for random liver biopsy per Dr Rush Landmark  Pt taking Vit K now 2 weeks  Past Medical History:  Diagnosis Date  . Glioblastoma ([place]) 02/13/2020  . Pneumonia 2012  . Seizure ([place]) 8/16/211   last one 02/02/2020  . Stroke [facility])    Left leg weakness    Past Surgical History:  Procedure Laterality Date  . APPLICATION OF CRANIAL NAVIGATION Right 02/13/2020   Procedure: APPLICATION OF CRANIAL NAVIGATION;  Surgeon: [name], [name], DO;  Location: [place];  Service: Neurosurgery;  Laterality: Right;  APPLICATION OF CRANIAL NAVIGATION  . BUBBLE STUDY  01/17/2020   Procedure: BUBBLE STUDY;  Surgeon: [name], MD;  Location: [place];  Service: Cardiovascular;;  . FRAMELESS  BIOPSY WITH BRAINLAB Right 02/13/2020   Procedure: STEREOTACTIC BRAIN BIOPSY OF RIGHT SIDED LESION;  Surgeon: [name], DO;  Location: [place];  Service: Neurosurgery;  Laterality: Right;  STEREOTACTIC BRAIN BIOPSY OF RIGHT SIDED LESION  . LOOP RECORDER INSERTION N/A 01/17/2020   Procedure: LOOP  RECORDER INSERTION;  Surgeon: [name], MD;  Location: [place] CV LAB;  Service: Cardiovascular;  Laterality: N/A;  . TEE WITHOUT CARDIOVERSION N/A 01/17/2020   Procedure: TRANSESOPHAGEAL ECHOCARDIOGRAM (TEE);  Surgeon: [name], MD;  Location: [facility] ENDOSCOPY;  Service: Cardiovascular;  Laterality: N/A;    Allergies: Vicodin [hydrocodone-acetaminophen]  Medications: Prior to Admission medications   Medication Sig Start Date End Date Taking? Authorizing Provider  chlorproMAZINE (THORAZINE) 25 MG tablet Take 1 tablet (25 mg total) by mouth 3 (three) times daily as needed for hiccoughs. 06/06/20  Yes Vaslow, Acey Lav, MD  dexamethasone (DECADRON) 2 MG tablet Take 1 tablet (2 mg total) by mouth daily. 06/20/20  Yes Vaslow, Acey Lav, MD  levETIRAcetam (KEPPRA) 1000 MG tablet Take 1 tablet (1,000 mg total) by mouth 2 (two) times daily. 03/27/20 04/26/20 Yes Vaslow, Acey Lav, MD  ondansetron (ZOFRAN) 8 MG tablet Take 1 tablet (8 mg total) by mouth 2 (two) times daily as needed (nausea and vomiting). May take 30-60 minutes prior to Temodar administration if nausea/vomiting occurs. Patient not taking: No sig reported 03/07/20   Ventura Sellers, MD  pantoprazole (PROTONIX) 40 MG tablet Take 1 tablet (40 mg total) by mouth 2 (two) times daily. Take 30-60 minutes before breakfast and dinner Patient not taking: No sig reported 06/11/20   Levin Erp, PA     Family History  Problem Relation Age of Onset  . Hypertension Mother   . Diabetes Mother   . Lung cancer Father   . Hypertension Sister   . Prostate cancer Brother   . Prostate cancer Paternal Grandfather   . Hypertension Brother   . Hypertension Brother   .  Hyperlipidemia Brother     Social History   Socioeconomic History  . Marital status: Single    Spouse name: Not on file  . Number of children: 1  . Years of education: Not on file  . Highest education level: Not on file  Occupational History  .  Not on file  Tobacco Use  . Smoking status: Former Smoker    Years: 2.00    Types: Cigars    Quit date: 2002    Years since quitting: 20.1  . Smokeless tobacco: Never Used  Vaping Use  . Vaping Use: Never used  Substance and Sexual Activity  . Alcohol use: Not Currently  . Drug use: No  . Sexual activity: Yes    Birth control/protection: None  Other Topics Concern  . Not on file  Social History Narrative  . Not on file   Social Determinants of Health   Financial Resource Strain: Not on file  Food Insecurity: Not on file  Transportation Needs: Not on file  Physical Activity: Not on file  Stress: Not on file  Social Connections: Not on file     Review of Systems: A 12 point ROS discussed and pertinent positives are indicated in the HPI above.  All other systems are negative.  Review of Systems  Constitutional: Positive for activity change and fatigue. Negative for fever.  Respiratory: Negative for cough and shortness of breath.   Cardiovascular: Negative for chest pain.  Gastrointestinal: Positive for nausea.  Psychiatric/Behavioral: Negative for behavioral problems and confusion.    Vital Signs: BP 125/89 (BP Location: Right Arm)   Pulse 72   Temp 98.1 F (36.7 C) (Oral)   Resp 17   Ht 5\' 11"  (1.803 m)   Wt 160 lb (72.6 kg)   SpO2 100%   BMI 22.32 kg/m   Physical Exam Vitals reviewed.  HENT:     Mouth/Throat:     Mouth: Mucous membranes are moist.  Cardiovascular:     Rate and Rhythm: Normal rate and regular rhythm.     Heart sounds: Normal heart sounds.  Pulmonary:     Effort: Pulmonary effort is normal.     Breath sounds: Normal breath sounds.  Abdominal:     Palpations: Abdomen is soft.     Tenderness: There is no abdominal tenderness.  Musculoskeletal:        General: Normal range of motion.  Skin:    General: Skin is warm.  Neurological:     Mental Status: He is alert and oriented to person, place, and time.  Psychiatric:        Behavior:  Behavior normal.     Imaging: MR 3D Recon At Scanner  Result Date: 06/23/2020 CLINICAL DATA:  Elevated LFTs.  Bloating.  Jaundice. EXAM: MRI ABDOMEN WITHOUT AND WITH CONTRAST (INCLUDING MRCP) TECHNIQUE: Multiplanar multisequence MR imaging of the abdomen was performed both before and after the administration of intravenous contrast. Heavily T2-weighted images of the biliary and pancreatic ducts were obtained, and three-dimensional MRCP images were rendered by post processing. CONTRAST:  60mL GADAVIST GADOBUTROL 1 MMOL/ML IV SOLN COMPARISON:  CT 05/16/2020 FINDINGS: Lower chest:  Lung bases are clear. Hepatobiliary: Scattered small hepatic cysts within LEFT and RIGHT hepatic lobe. No intrahepatic biliary duct dilatation. The common bile duct is normal caliber. Gallbladder is collapsed. Liver parenchyma appears normal. No evidence of cirrhosis. No hepatic steatosis. Postcontrast enhanced imaging demonstrates no enhancing hepatic lesion. Pancreas: Pancreatic parenchyma appears normal. No variant pancreatic ductal anatomy. Spleen: Normal  spleen. Adrenals/urinary tract: Adrenal glands and kidneys are normal. Stomach/Bowel: Stomach and limited of the small bowel is unremarkable Vascular/Lymphatic: Abdominal aortic normal caliber. No retroperitoneal periportal lymphadenopathy. Musculoskeletal: No aggressive osseous lesion IMPRESSION: 1. No evidence of biliary obstruction. No intrahepatic or extrahepatic duct dilatation. Normal common bile duct. 2. Gallbladder collapsed. 3. No hepatic parenchymal abnormality other than multiple small benign hepatic cysts. 4. Normal pancreas. 5. No explanation for elevated bilirubin. Electronically Signed   By: Suzy Bouchard M.D.   On: 06/23/2020 08:54   MR ABDOMEN MRCP W WO CONTAST  Result Date: 06/23/2020 CLINICAL DATA:  Elevated LFTs.  Bloating.  Jaundice. EXAM: MRI ABDOMEN WITHOUT AND WITH CONTRAST (INCLUDING MRCP) TECHNIQUE: Multiplanar multisequence MR imaging of the  abdomen was performed both before and after the administration of intravenous contrast. Heavily T2-weighted images of the biliary and pancreatic ducts were obtained, and three-dimensional MRCP images were rendered by post processing. CONTRAST:  41mL GADAVIST GADOBUTROL 1 MMOL/ML IV SOLN COMPARISON:  CT 05/16/2020 FINDINGS: Lower chest:  Lung bases are clear. Hepatobiliary: Scattered small hepatic cysts within LEFT and RIGHT hepatic lobe. No intrahepatic biliary duct dilatation. The common bile duct is normal caliber. Gallbladder is collapsed. Liver parenchyma appears normal. No evidence of cirrhosis. No hepatic steatosis. Postcontrast enhanced imaging demonstrates no enhancing hepatic lesion. Pancreas: Pancreatic parenchyma appears normal. No variant pancreatic ductal anatomy. Spleen: Normal spleen. Adrenals/urinary tract: Adrenal glands and kidneys are normal. Stomach/Bowel: Stomach and limited of the small bowel is unremarkable Vascular/Lymphatic: Abdominal aortic normal caliber. No retroperitoneal periportal lymphadenopathy. Musculoskeletal: No aggressive osseous lesion IMPRESSION: 1. No evidence of biliary obstruction. No intrahepatic or extrahepatic duct dilatation. Normal common bile duct. 2. Gallbladder collapsed. 3. No hepatic parenchymal abnormality other than multiple small benign hepatic cysts. 4. Normal pancreas. 5. No explanation for elevated bilirubin. Electronically Signed   By: Suzy Bouchard M.D.   On: 06/23/2020 08:54    Labs:  CBC: Recent Labs    05/20/20 0340 06/05/20 1145 06/11/20 1148 07/02/20 0625  WBC 10.9* 9.8 12.0* 7.9  HGB 10.2* 11.3* 11.0* 11.3*  HCT 27.4* 33.7* 32.0* 32.5*  PLT 232 190 218 198    COAGS: Recent Labs    01/14/20 0835 02/13/20 0650 05/16/20 1621 05/17/20 0541 06/11/20 1034 06/20/20 1100  INR 1.0 1.1 1.2 1.2 1.3* 1.2*  APTT 27 32 27  --   --   --     BMP: Recent Labs    02/01/20 0751 02/02/20 0316 02/13/20 0650 02/14/20 1122  03/27/20 1144 05/18/20 0157 05/19/20 0433 05/20/20 0340 06/05/20 1145 06/11/20 1034  NA 140 140 140 140   < > 135 131* 132* 132* 127*  K 3.9 3.7 4.1 3.8   < > 3.5 3.2* 3.4* 3.2* 3.2*  CL 105 107 107 103   < > 104 101 101 100 93*  CO2 26 24 26 23    < > 22 20* 22 23 26   GLUCOSE 102* 100* 99 129*   < > 113* 107* 126* 114* 115*  BUN 10 10 9 11    < > 14 13 14 17 19   CALCIUM 10.0 9.1 9.5 9.8   < > 8.6* 8.8* 8.7* 9.3 9.1  CREATININE 1.28* 1.08 1.14 1.29*   < > 0.94 0.92 1.00 1.23 1.12  GFRNONAA >60 >60 >60 >60   < > >60 >60 >60 >60  --   GFRAA >60 >60 >60 >60  --   --   --   --   --   --    < > =  values in this interval not displayed.    LIVER FUNCTION TESTS: Recent Labs    06/05/20 1145 06/11/20 1034 06/20/20 1100 06/27/20 1016  BILITOT 29.3* 31.9* 27.5* 26.5*  AST 291* 285* 124* 84*  ALT 980* 1,116* 544* 296*  ALKPHOS 568* 597* 466* 391*  PROT 6.0* 5.6* 5.5* 5.3*  ALBUMIN 2.3* 3.5 3.4* 3.2*    TUMOR MARKERS: No results for input(s): AFPTM, CEA, CA199, CHROMGRNA in the last 8760 hours.  Assessment and Plan:  Recent dx Glioblastoma Development of jaundice and elevated liver functions noted approx 4-6 weeks ago Scheduled now for random liver bx Risks and benefits of random liver biopsy was discussed with the patient and/or patient's family including, but not limited to bleeding, infection, damage to adjacent structures or low yield requiring additional tests.  All of the questions were answered and there is agreement to proceed. Consent signed and in chart.   Thank you for this interesting consult.  I greatly enjoyed meeting JULIE PAOLINI and look forward to participating in their care.  A copy of this report was sent to the requesting provider on this date.  Electronically Signed: Lavonia Drafts, PA-C 07/02/2020, 7:30 AM   I spent a total of  30 Minutes   in face to face in clinical consultation, greater than 50% of which was counseling/coordinating care for random  liver bx

## 2020-07-02 NOTE — Procedures (Signed)
Interventional Radiology Procedure Note  Procedure: Random liver biopsy  Indication: Elevated liver function tests  Findings: Please refer to procedural dictation for full description.  Complications: None  EBL: < 10 mL  Miachel Roux, MD [phone]

## 2020-07-02 NOTE — Discharge Instructions (Signed)
Liver Biopsy, Care After These instructions give you information on caring for yourself after your procedure. Your doctor may also give you more specific instructions. Call your doctor if you have any problems or questions after your procedure. What can I expect after the procedure? After the procedure, it is common to have:  Pain and soreness where the biopsy was done.  Bruising around the area where the biopsy was done.  Sleepiness and be tired for a few days. Follow these instructions at home: Medicines  Take over-the-counter and prescription medicines only as told by your doctor.  If you were prescribed an antibiotic medicine, take it as told by your doctor. Do not stop taking the antibiotic even if you start to feel better.  Do not take medicines such as aspirin and ibuprofen. These medicines can thin your blood. Do not take these medicines unless your doctor tells you to take them.  If you are taking prescription pain medicine, take actions to prevent or treat constipation. Your doctor may recommend that you: ? Drink enough fluid to keep your pee (urine) clear or pale yellow. ? Take over-the-counter or prescription medicines. ? Eat foods that are high in fiber, such as fresh fruits and vegetables, whole grains, and beans. ? Limit foods that are high in fat and processed sugars, such as fried and sweet foods. Caring for your cut  Follow instructions from your doctor about how to take care of your cuts from surgery (incisions). Make sure you: ? Wash your hands with soap and water before you change your bandage (dressing). If you cannot use soap and water, use hand sanitizer. ? Change your bandage as told by your doctor. ? Leave stitches (sutures), skin glue, or skin tape (adhesive) strips in place. They may need to stay in place for 2 weeks or longer. If tape strips get loose and curl up, you may trim the loose edges. Do not remove tape strips completely unless your doctor says it is  okay.  Check your cuts every day for signs of infection. Check for: ? Redness, swelling, or more pain. ? Fluid or blood. ? Pus or a bad smell. ? Warmth.  Do not take baths, swim, or use a hot tub until your doctor says it is okay to do so. Activity  Rest at home for 1-2 days or as told by your doctor. ? Avoid sitting for a long time without moving. Get up to take short walks every 1-2 hours.  Return to your normal activities as told by your doctor. Ask what activities are safe for you.  Do not do these things in the first 24 hours: ? Drive. ? Use machinery. ? Take a bath or shower.  Do not lift more than 10 pounds (4.5 kg) or play contact sports for the first 2 weeks.   General instructions  Do not drink alcohol in the first week after the procedure.  Have someone stay with you for at least 24 hours after the procedure.  Get your test results. Ask your doctor or the department that is doing the test: ? When will my results be ready? ? How will I get my results? ? What are my treatment options? ? What other tests do I need? ? What are my next steps?  Keep all follow-up visits as told by your doctor. This is important.   Contact a doctor if:  A cut bleeds and leaves more than just a small spot of blood.  A cut is red,   puffs up (swells), or hurts more than before.  Fluid or something else comes from a cut.  A cut smells bad.  You have a fever or chills. Get help right away if:  You have swelling, bloating, or pain in your belly (abdomen).  You get dizzy or faint.  You have a rash.  You feel sick to your stomach (nauseous) or throw up (vomit).  You have trouble breathing, feel short of breath, or feel faint.  Your chest hurts.  You have problems talking or seeing.  You have trouble with your balance or moving your arms or legs. Summary  After the procedure, it is common to have pain, soreness, bruising, and tiredness.  Your doctor will tell you how to  take care of yourself at home. Change your bandage, take your medicines, and limit your activities as told by your doctor.  Call your doctor if you have symptoms of infection. Get help right away if your belly swells, your cut bleeds a lot, or you have trouble talking or breathing. This information is not intended to replace advice given to you by your health care provider. Make sure you discuss any questions you have with your health care provider. Document Revised: 05/26/2017 Document Reviewed: 05/27/2017 Elsevier Patient Education  2021 [employer]  

## 2020-07-02 NOTE — Sedation Documentation (Signed)
02 d/c 

## 2020-07-03 ENCOUNTER — Other Ambulatory Visit: Payer: Self-pay | Admitting: Physician Assistant

## 2020-07-03 LAB — SURGICAL PATHOLOGY

## 2020-07-04 ENCOUNTER — Encounter: Payer: Self-pay | Admitting: Internal Medicine

## 2020-07-04 MED FILL — levETIRAcetam 1000 MG TABS: 1000 | 30 days supply | Qty: 60 | Fill #0

## 2020-07-07 ENCOUNTER — Other Ambulatory Visit: Payer: Self-pay | Admitting: Internal Medicine

## 2020-07-07 ENCOUNTER — Ambulatory Visit (INDEPENDENT_AMBULATORY_CARE_PROVIDER_SITE_OTHER): Payer: No Typology Code available for payment source

## 2020-07-07 DIAGNOSIS — C711 Malignant neoplasm of frontal lobe: Secondary | ICD-10-CM

## 2020-07-07 DIAGNOSIS — I639 Cerebral infarction, unspecified: Secondary | ICD-10-CM

## 2020-07-08 ENCOUNTER — Encounter: Payer: Self-pay | Admitting: Licensed Clinical Social Worker

## 2020-07-08 ENCOUNTER — Other Ambulatory Visit: Payer: Self-pay | Admitting: Internal Medicine

## 2020-07-08 ENCOUNTER — Other Ambulatory Visit: Payer: Self-pay

## 2020-07-08 ENCOUNTER — Telehealth: Payer: Self-pay | Admitting: Gastroenterology

## 2020-07-08 DIAGNOSIS — R7989 Other specified abnormal findings of blood chemistry: Secondary | ICD-10-CM

## 2020-07-08 MED ORDER — DEXAMETHASONE 4 MG PO TABS: 8.0000 mg | ORAL_TABLET | Freq: Two times a day (BID) | ORAL | 1 refills | Status: DC

## 2020-07-08 MED FILL — DEXAMETHASONE 4 MG TABLET: 4 | 23 days supply | Qty: 90 | Fill #0

## 2020-07-08 NOTE — Telephone Encounter (Signed)
Pt is returning a missed call regarding his results.

## 2020-07-08 NOTE — Telephone Encounter (Signed)
I spoke with [name] this afternoon. His office/team will be contacting the patient for next steps in evaluation and treatment and decide upon medications such as steroids and/or further imaging and will decide upon the need for admission or not. Patty, I would plan for the patient to come in over the next 1 to 2 weeks to have a hepatic function panel/INR performed. Set up a follow-up in clinic with PA Lemmon or myself in 4 to 6 weeks. We will consider next laboratory evaluation after these come back. Thanks. GM

## 2020-07-08 NOTE — Progress Notes (Signed)
Spoke with Mr. Kernodle via phone.  He describes acute on chronic decline in motor function affecting the left arm and leg.  Change occurred sub-acutely over the past couple of days, it was not sudden in nature.  Likely etiology is inflammatory from tumor and treatment effect.  We recommended short course of high dose dexamethasone, 8mg  BID starting today, carried until next in person follow up.    MRI will be obtained on 07/11/20 and follow up in clinic on 07/14/20.  He knows if symptoms continue to worsen he should seek emergency care to rule out hemorrhagic conversion or other processes.   He is agreeable with this plan.  Ventura Sellers, MD

## 2020-07-08 NOTE — Progress Notes (Signed)
[place] CSW Progress Note  Clinical Education officer, museum contacted patient by phone after receiving request to assist patient with disability and advanced directive questions. Spoke with patient who would like to apply for disability and does not believe he has started an application yet. Referral made to [facility] today for assistance.  Patient is interested in creating his advanced directives. CSW offered spot during upcoming advanced directives clinic, but patient preferred information be mailed to him first for review. CSW mailed AD packet today.  Provided direct contact information to patient who may call with any further questions or needs.   Christeen Douglas , LCSW

## 2020-07-08 NOTE — Telephone Encounter (Signed)
Dr Rush Landmark please call pt at [phone]. He is returning your call

## 2020-07-08 NOTE — Telephone Encounter (Signed)
I was able to reach out and speak with the patient as well as his brother this afternoon after they called back.  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  Case report and liver toxicity database suggested that it can take 6 to 8 months for near normalization of the patient case report who had been on the chemotherapy.  More concerning today however is that the patient is describing significant left leg weakness to the point that he cannot walk.  He is also experiencing some weakness in his left upper extremity.  Pain is not as significant of an issue but certainly this is new and progressing within the last 24 hours.  I discussed this with the patient's brother who also confirms this.  I told the patient that there is a very high likelihood that the patient may need to come into the hospital for further evaluation if the symptoms are occurring as it could be suggestive of further progression of his known brain cancer versus the possibility of increased amounts of intracranial swelling versus if there is any issue within the cervical spine that could be occurring leading to his issues.  I will try to reach [name] to discuss the patient but I did tell the patient and his brother to prepare for potentially coming into the emergency room due to the significant change in his motor use of his left extremities.  This does not sound like he has had a stroke but it is concerning enough with his history that I think further evaluation is necessary.  So if I cannot get a hold of [name] I will direct the patient to the [facility] emergency department for further evaluation of symptoms within the next couple of hours since it is already been over 24 hours since the initiation of these particular symptoms.  Justice Britain,  MD [facility]oscopy Office # [phone]

## 2020-07-08 NOTE — Telephone Encounter (Signed)
Lab orders have been entered to be completed in the next 1-2 weeks.  Follow up has been scheduled for 08/19/20 at 910 am with Dr Rush Landmark.  The pt has been advised.

## 2020-07-11 ENCOUNTER — Other Ambulatory Visit: Payer: Self-pay

## 2020-07-11 ENCOUNTER — Ambulatory Visit (HOSPITAL_COMMUNITY)
Admission: RE | Admit: 2020-07-11 | Discharge: 2020-07-11 | Disposition: A | Discharge status: Home / Self Care | Payer: No Typology Code available for payment source | Source: Ambulatory Visit | Attending: Internal Medicine | Admitting: Internal Medicine

## 2020-07-11 DIAGNOSIS — R7989 Other specified abnormal findings of blood chemistry: Secondary | ICD-10-CM

## 2020-07-11 DIAGNOSIS — C711 Malignant neoplasm of frontal lobe: Secondary | ICD-10-CM | POA: Insufficient documentation

## 2020-07-11 MED ORDER — GADOBUTROL 1 MMOL/ML IV SOLN
7.0000 mL | Freq: Once | INTRAVENOUS | Status: AC | PRN
  Administered 2020-07-11: 7 mL via INTRAVENOUS

## 2020-07-11 NOTE — Progress Notes (Signed)
Carelink Summary Report / Loop Recorder 

## 2020-07-13 ENCOUNTER — Encounter: Payer: Self-pay | Admitting: Gastroenterology

## 2020-07-14 ENCOUNTER — Emergency Department (HOSPITAL_COMMUNITY): Payer: No Typology Code available for payment source

## 2020-07-14 ENCOUNTER — Observation Stay (HOSPITAL_COMMUNITY): Payer: No Typology Code available for payment source

## 2020-07-14 ENCOUNTER — Other Ambulatory Visit: Payer: Self-pay

## 2020-07-14 ENCOUNTER — Other Ambulatory Visit (HOSPITAL_COMMUNITY): Payer: Self-pay | Admitting: Internal Medicine

## 2020-07-14 ENCOUNTER — Encounter (HOSPITAL_COMMUNITY): Payer: Self-pay | Admitting: Emergency Medicine

## 2020-07-14 ENCOUNTER — Observation Stay (HOSPITAL_COMMUNITY)
Admission: EM | Admit: 2020-07-14 | Discharge: 2020-07-14 | Disposition: A | Discharge status: Home / Self Care | Payer: No Typology Code available for payment source | Attending: Internal Medicine | Admitting: Internal Medicine

## 2020-07-14 ENCOUNTER — Inpatient Hospital Stay: Payer: No Typology Code available for payment source | Attending: Neurological Surgery

## 2020-07-14 DIAGNOSIS — R0989 Other specified symptoms and signs involving the circulatory and respiratory systems: Secondary | ICD-10-CM | POA: Diagnosis not present

## 2020-07-14 DIAGNOSIS — K7589 Other specified inflammatory liver diseases: Secondary | ICD-10-CM

## 2020-07-14 DIAGNOSIS — G9389 Other specified disorders of brain: Secondary | ICD-10-CM | POA: Insufficient documentation

## 2020-07-14 DIAGNOSIS — G40909 Epilepsy, unspecified, not intractable, without status epilepticus: Secondary | ICD-10-CM

## 2020-07-14 DIAGNOSIS — R0602 Shortness of breath: Secondary | ICD-10-CM

## 2020-07-14 DIAGNOSIS — Z87891 Personal history of nicotine dependence: Secondary | ICD-10-CM | POA: Insufficient documentation

## 2020-07-14 DIAGNOSIS — Z20822 Contact with and (suspected) exposure to covid-19: Secondary | ICD-10-CM | POA: Diagnosis not present

## 2020-07-14 DIAGNOSIS — R17 Unspecified jaundice: Secondary | ICD-10-CM

## 2020-07-14 DIAGNOSIS — R066 Hiccough: Secondary | ICD-10-CM | POA: Diagnosis present

## 2020-07-14 DIAGNOSIS — T50905A Adverse effect of unspecified drugs, medicaments and biological substances, initial encounter: Secondary | ICD-10-CM | POA: Diagnosis present

## 2020-07-14 DIAGNOSIS — K219 Gastro-esophageal reflux disease without esophagitis: Secondary | ICD-10-CM | POA: Diagnosis present

## 2020-07-14 DIAGNOSIS — K716 Toxic liver disease with hepatitis, not elsewhere classified: Secondary | ICD-10-CM | POA: Insufficient documentation

## 2020-07-14 DIAGNOSIS — R1013 Epigastric pain: Secondary | ICD-10-CM | POA: Diagnosis not present

## 2020-07-14 DIAGNOSIS — C711 Malignant neoplasm of frontal lobe: Secondary | ICD-10-CM

## 2020-07-14 LAB — CBC WITH DIFFERENTIAL/PLATELET
Abs Immature Granulocytes: 2.59 K/uL — ABNORMAL HIGH (ref 0.00–0.07)
Basophils Absolute: 0.1 K/uL (ref 0.0–0.1)
Basophils Relative: 1 %
Eosinophils Absolute: 0 K/uL (ref 0.0–0.5)
Eosinophils Relative: 0 %
HCT: 32.4 % — ABNORMAL LOW (ref 39.0–52.0)
Hemoglobin: 11.3 g/dL — ABNORMAL LOW (ref 13.0–17.0)
Immature Granulocytes: 15 %
Lymphocytes Relative: 4 %
Lymphs Abs: 0.7 K/uL (ref 0.7–4.0)
MCH: 33.6 pg (ref 26.0–34.0)
MCHC: 34.9 g/dL (ref 30.0–36.0)
MCV: 96.4 fL (ref 80.0–100.0)
Monocytes Absolute: 1 K/uL (ref 0.1–1.0)
Monocytes Relative: 6 %
Neutro Abs: 13.1 K/uL — ABNORMAL HIGH (ref 1.7–7.7)
Neutrophils Relative %: 74 %
Platelets: 274 K/uL (ref 150–400)
RBC: 3.36 MIL/uL — ABNORMAL LOW (ref 4.22–5.81)
RDW: 20.2 % — ABNORMAL HIGH (ref 11.5–15.5)
WBC: 17.4 K/uL — ABNORMAL HIGH (ref 4.0–10.5)
nRBC: 2.9 % — ABNORMAL HIGH (ref 0.0–0.2)

## 2020-07-14 LAB — URINALYSIS, COMPLETE (UACMP) WITH MICROSCOPIC
Bacteria, UA: NONE SEEN
Glucose, UA: NEGATIVE mg/dL
Hgb urine dipstick: NEGATIVE
Ketones, ur: NEGATIVE mg/dL
Leukocytes,Ua: NEGATIVE
Nitrite: NEGATIVE
Protein, ur: NEGATIVE mg/dL
Specific Gravity, Urine: >1.046 — ABNORMAL HIGH (ref 1.005–1.030)
pH: 6 (ref 5.0–8.0)

## 2020-07-14 LAB — HEPATIC FUNCTION PANEL
ALT: 353 U/L — ABNORMAL HIGH (ref 0–44)
AST: 146 U/L — ABNORMAL HIGH (ref 15–41)
Albumin: 2.4 g/dL — ABNORMAL LOW (ref 3.5–5.0)
Alkaline Phosphatase: 384 U/L — ABNORMAL HIGH (ref 38–126)
Bilirubin, Direct: 14.9 mg/dL — ABNORMAL HIGH (ref 0.0–0.2)
Indirect Bilirubin: 5.8 mg/dL — ABNORMAL HIGH (ref 0.3–0.9)
Total Bilirubin: 20.7 mg/dL (ref 0.3–1.2)
Total Protein: 5.4 g/dL — ABNORMAL LOW (ref 6.5–8.1)

## 2020-07-14 LAB — I-STAT CHEM 8, ED
BUN: 23 mg/dL — ABNORMAL HIGH (ref 6–20)
Calcium, Ion: 1.19 mmol/L (ref 1.15–1.40)
Chloride: 105 mmol/L (ref 98–111)
Creatinine, Ser: 1.2 mg/dL (ref 0.61–1.24)
Glucose, Bld: 186 mg/dL — ABNORMAL HIGH (ref 70–99)
HCT: 39 % (ref 39.0–52.0)
Hemoglobin: 13.3 g/dL (ref 13.0–17.0)
Potassium: 4.1 mmol/L (ref 3.5–5.1)
Sodium: 138 mmol/L (ref 135–145)
TCO2: 28 mmol/L (ref 22–32)

## 2020-07-14 LAB — RESP PANEL BY RT-PCR (FLU A&B, COVID) ARPGX2
Influenza A by PCR: NEGATIVE
Influenza B by PCR: NEGATIVE
SARS Coronavirus 2 by RT PCR: NEGATIVE

## 2020-07-14 LAB — TROPONIN I (HIGH SENSITIVITY)
Troponin I (High Sensitivity): 6 ng/L (ref <18)
Troponin I (High Sensitivity): 5 ng/L (ref <18)

## 2020-07-14 LAB — PROCALCITONIN: Procalcitonin: 0.11 ng/mL

## 2020-07-14 MED ORDER — ONDANSETRON HCL 4 MG PO TABS: 4.0000 mg | ORAL_TABLET | Freq: Four times a day (QID) | ORAL | Status: DC | PRN

## 2020-07-14 MED ORDER — DEXAMETHASONE 4 MG PO TABS
8.0000 mg | ORAL_TABLET | Freq: Two times a day (BID) | ORAL | Status: DC
  Administered 2020-07-14: 8 mg via ORAL
  Filled 2020-07-14: qty 2

## 2020-07-14 MED ORDER — IOHEXOL 300 MG/ML  SOLN
100.0000 mL | Freq: Once | INTRAMUSCULAR | Status: AC | PRN
  Administered 2020-07-14: 100 mL via INTRAVENOUS

## 2020-07-14 MED ORDER — CHLORPROMAZINE HCL 25 MG PO TABS
25.0000 mg | ORAL_TABLET | Freq: Once | ORAL | Status: AC
  Administered 2020-07-14: 25 mg via ORAL
  Filled 2020-07-14: qty 1

## 2020-07-14 MED ORDER — GABAPENTIN 100 MG PO CAPS
100.0000 mg | ORAL_CAPSULE | Freq: Three times a day (TID) | ORAL | Status: DC
  Administered 2020-07-14 (×2): 100 mg via ORAL
  Filled 2020-07-14 (×2): qty 1

## 2020-07-14 MED ORDER — LEVETIRACETAM 500 MG PO TABS
1000.0000 mg | ORAL_TABLET | Freq: Two times a day (BID) | ORAL | Status: DC
  Administered 2020-07-14: 1000 mg via ORAL
  Filled 2020-07-14: qty 2

## 2020-07-14 MED ORDER — PANTOPRAZOLE SODIUM 40 MG PO TBEC
40.0000 mg | DELAYED_RELEASE_TABLET | Freq: Two times a day (BID) | ORAL | Status: DC
  Administered 2020-07-14: 40 mg via ORAL
  Filled 2020-07-14: qty 1

## 2020-07-14 MED ORDER — POLYETHYLENE GLYCOL 3350 17 G PO PACK: 17.0000 g | PACK | Freq: Every day | ORAL | Status: DC | PRN

## 2020-07-14 MED ORDER — ONDANSETRON HCL 4 MG/2ML IJ SOLN: 4.0000 mg | Freq: Four times a day (QID) | INTRAMUSCULAR | Status: DC | PRN

## 2020-07-14 MED ORDER — ALUM & MAG HYDROXIDE-SIMETH 200-200-20 MG/5ML PO SUSP
30.0000 mL | Freq: Once | ORAL | Status: AC
  Administered 2020-07-14: 30 mL via ORAL
  Filled 2020-07-14: qty 30

## 2020-07-14 MED ORDER — ENOXAPARIN SODIUM 40 MG/0.4ML ~~LOC~~ SOLN
40.0000 mg | SUBCUTANEOUS | Status: DC
  Administered 2020-07-14: 40 mg via SUBCUTANEOUS
  Filled 2020-07-14: qty 0.4

## 2020-07-14 MED ORDER — GABAPENTIN 100 MG PO CAPS: 100.0000 mg | ORAL_CAPSULE | Freq: Three times a day (TID) | ORAL | 0 refills | Status: DC

## 2020-07-14 MED ORDER — SODIUM CHLORIDE 0.9 % IV SOLN
3.0000 g | Freq: Once | INTRAVENOUS | Status: AC
  Administered 2020-07-14: 3 g via INTRAVENOUS
  Filled 2020-07-14: qty 8

## 2020-07-14 MED ORDER — SODIUM CHLORIDE 0.9 % IV SOLN: INTRAVENOUS | Status: AC

## 2020-07-14 MED ORDER — IBUPROFEN 200 MG PO TABS: 400.0000 mg | ORAL_TABLET | Freq: Four times a day (QID) | ORAL | Status: DC | PRN

## 2020-07-14 MED FILL — GABAPENTIN 100 MG CAPSULE: 100 | 30 days supply | Qty: 90 | Fill #0

## 2020-07-14 NOTE — ED Notes (Signed)
Pt given meal tray.

## 2020-07-14 NOTE — Discharge Summary (Signed)
Physician Discharge Summary  John Watson BJS:[id number] DOB: 1961/04/26 DOA: 07/14/2020  PCP: Merryl Hacker, No  Admit date: 07/14/2020 Discharge date: 07/14/2020  Admitted From: Home  Discharge disposition: home  Recommendations for Outpatient Follow-Up:   . Follow up with your primary care provider in one week.  . Check CBC, CMP, magnesium in the next visit  Discharge Diagnosis:   Principal Problem:   Choking sensation Active Problems:   Seizure disorder (HCC)   Glioblastoma of frontal lobe (HCC)   Intractable hiccups   GERD without esophagitis   Drug-induced cholestatic hepatitis   Discharge Condition: Improved.  Diet recommendation:   Regular.  Wound care: None.  Code status: DNR   History of Present Illness:   60 year old male with past medical history of glioblastoma of the frontal lobe, seizure disorder secondary to underlying tumor, hyperlipidemia, prior CVA and recent diagnosis of cholestatic hepatitis (based on biopsy results 2/2) presented to [facility] emergency department with complaints of sensations of choking and hiccups.  Patient does have a history of glioblastoma diagnosed September 2021 and has been having some bouts of hiccuping and belching.  She was on Thorazine Protonix and baclofen as outpatient.  Was also having severe reflux at home, he has been prescribed Protonix but was not compliant with.  Patient had been seen by  gastroenterologist Dr. Rush Landmark concerning this as outpatient.  Due to the symptoms, patient was evaluated in the emergency department.  He was then placed in observation.  Hospital Course:   Following conditions were addressed during hospitalization as listed below,  Choking sensation/hiccups Presented with feeling of choking sensation.  Patient did have no episodes of hiccups during my whole interview.  Patient was advised to continue Protonix twice a day.  He was initiated on gabapentin 100 mg p.o. 3 times daily on  admission, this will be continued on discharge.  Patient will need follow-up with GI as outpatient.  Patient was able to tolerate p.o. diet as well.  CT scan of the soft tissue of the neck was negative.  CT head scan was positive for previous glioblastoma multiforme.  CT scan of the abdomen was negative for acute findings.  Drug-induced cholestatic hepatitis Patient was seen by GI during hospitalization.  Will need to follow-up with GI as outpatient.  Did have  significant jaundice.  Transaminases trending down but will likely take some time.  Patient was on Decadron as outpatient which will be continued.    Seizure disorder   Continue home regimen of Keppra     Glioblastoma of frontal lobe  Patient follows up with Dr. Shelly Bombard as outpatient. Unfortunately, chemotherapy is on hiatus due to ongoing cholestatic hepatitis. Long-term prognosis is guarded     GERD without esophagitis Emphasized the need for being on Protonix twice a day. If patient continues to experience symptoms then patient may benefit from an EGD and manometry as an outpatient with gastroenterology.     Leukocytosis  Likely steroid-induced, Chest x-ray reveals no evidence of pneumonia  Disposition.  At this time, patient is stable for disposition home with outpatient GI follow-up  Medical Consultants:    GI  Procedures:    None Subjective:   Today, patient was seen and examined at bedside.  No hiccups or choking episode.  Feels okay and wishes to go home  Discharge Exam:   Vitals:   07/14/20 1200 07/14/20 1300  BP: 128/82 (!) 150/96  Pulse: 77 98  Resp: 12 15  Temp:    SpO2:  100% 100%   Vitals:   07/14/20 0700 07/14/20 1000 07/14/20 1200 07/14/20 1300  BP: 119/81 132/84 128/82 (!) 150/96  Pulse: (!) 47 83 77 98  Resp: 11 15 12 15   Temp:      TempSrc:      SpO2: 98% 97% 100% 100%  Weight:      Height:       General: Alert awake, not in obvious distress communicative no hiccups noted HENT: pupils  equally reacting to light, icterus positive.. Oral mucosa is moist.  Chest:  Clear breath sounds.  Diminished breath sounds bilaterally. No crackles or wheezes.  CVS: S1 &S2 heard. No murmur.  Regular rate and rhythm. Abdomen: Soft, nontender, nondistended.  Bowel sounds are heard.   Extremities: No cyanosis, clubbing or edema.  Peripheral pulses are palpable. Psych: Alert, awake and communicative,, normal mood CNS:  No cranial nerve deficits.  Moves all extremities Skin: Warm and dry.  No rashes noted.  Icteric.  The results of significant diagnostics from this hospitalization (including imaging, microbiology, ancillary and laboratory) are listed below for reference.     Diagnostic Studies:   DG Neck Soft Tissue  Result Date: 07/14/2020 CLINICAL DATA:  Soft tissues, unable to stop belching. EXAM: NECK SOFT TISSUES - 1+ VIEW COMPARISON:  None. FINDINGS: There is no evidence of retropharyngeal soft tissue swelling or epiglottic enlargement. The cervical airway is unremarkable and no radio-opaque foreign body identified. Degenerative changes of the cervical spine. IMPRESSION: Negative. Electronically Signed   By: Iven Finn M.D.   On: 07/14/2020 05:53   CT Head Wo Contrast  Result Date: 07/14/2020 CLINICAL DATA:  Brain mass or lesion.  History of GBM EXAM: CT HEAD WITHOUT CONTRAST TECHNIQUE: Contiguous axial images were obtained from the base of the skull through the vertex without intravenous contrast. COMPARISON:  Brain MRI from 3 days ago FINDINGS: Brain: Known necrotic mass in the high and parasagittal right frontal lobe with adjacent white matter low-density and swelling. On axial slices, the mass measures up to 4.6 x 2.8 cm. No hemorrhage, hydrocephalus, or superimposed infarct. Some of the elevated density around the mass is attributable to a recent abdominal CT with contrast. Contiguous but separate appearing cortical and subcortical low-density at a right parietal gyrus with  appearance of nonenhancing tumor by prior MRI. Vascular: No hyperdense vessel or unexpected calcification. Skull: Normal. Negative for fracture or focal lesion. Sinuses/Orbits: No acute finding. IMPRESSION: Known right cerebral GBM.  No developments since a MRI 3 days ago. Electronically Signed   By: Monte Fantasia M.D.   On: 07/14/2020 04:42   CT ABDOMEN PELVIS W CONTRAST  Result Date: 07/14/2020 CLINICAL DATA:  Epigastric pain.  Elevated LFTs.  Belching. EXAM: CT ABDOMEN AND PELVIS WITH CONTRAST TECHNIQUE: Multidetector CT imaging of the abdomen and pelvis was performed using the standard protocol following bolus administration of intravenous contrast. CONTRAST:  152mL OMNIPAQUE IOHEXOL 300 MG/ML  SOLN COMPARISON:  CT 05/16/2020.  Abdominal MRI 06/23/2020 FINDINGS: Lower chest: Lingular atelectasis.  No pleural fluid. Hepatobiliary: Scattered small low-density lesions throughout the liver previously characterized as cysts. Collapsed gallbladder. No intra or extrahepatic biliary ductal dilatation. No pericholecystic fat stranding or calcified gallstone. Pancreas: Unremarkable. No pancreatic ductal dilatation or surrounding inflammatory changes. Spleen: Normal in size without focal abnormality. Adrenals/Urinary Tract: Normal adrenal glands. No hydronephrosis or perinephric edema. Tiny low-density lesion in the lower left kidney may contain fat density and represent an angiomyolipoma. Urinary bladder is completely decompressed. Stomach/Bowel: Ingested material within the  stomach. No gastric wall thickening. Normal positioning of the duodenum. No small bowel obstruction or inflammation. Cecum is slightly high-riding in the mid abdomen. Normal appendix courses centrally. Moderate volume of stool throughout the colon. No colonic wall thickening or inflammation. Vascular/Lymphatic: Aortic atherosclerosis. No aneurysm. No acute vascular findings. Patent portal vein. No enlarged lymph nodes in the abdomen or pelvis.  Reproductive: Prominent prostate gland spans 6 cm transverse. Other: No intra-abdominal ascites. No free air or focal fluid collection. Musculoskeletal: No focal bone lesion or acute osseous abnormality. IMPRESSION: 1. No acute abnormality in the abdomen/pelvis. No explanation for symptoms. 2. Scattered low-density lesions throughout the liver previously characterized as cysts. 3. Tiny low-density lesion in the lower left kidney may contain fat density and represent cyst or an angiomyolipoma. 4. Enlarged prostate gland. Aortic Atherosclerosis (ICD10-I70.0). Electronically Signed   By: Keith Rake M.D.   On: 07/14/2020 02:45   DG Chest Portable 1 View  Result Date: 07/14/2020 CLINICAL DATA:  Belching. EXAM: PORTABLE CHEST 1 VIEW COMPARISON:  Chest CT 05/16/2020 FINDINGS: Implanted loop recorder projects over the left chest. The heart is normal in size with normal mediastinal contours. No acute or focal airspace disease. No pleural fluid or pulmonary edema. No pneumothorax. No pulmonary mass on portable exam. No acute osseous abnormalities are seen. IMPRESSION: No acute chest findings. Electronically Signed   By: Keith Rake M.D.   On: 07/14/2020 00:46     Labs:   Basic Metabolic Panel: Recent Labs  Lab 07/14/20 0048  NA 138  K 4.1  CL 105  GLUCOSE 186*  BUN 23*  CREATININE 1.20   GFR Estimated Creatinine Clearance: 68.1 mL/min (by C-G formula based on SCr of 1.2 mg/dL). Liver Function Tests: Recent Labs  Lab 07/14/20 0036  AST 146*  ALT 353*  ALKPHOS 384*  BILITOT 20.7*  PROT 5.4*  ALBUMIN 2.4*   No results for input(s): LIPASE, AMYLASE in the last 168 hours. No results for input(s): AMMONIA in the last 168 hours. Coagulation profile No results for input(s): INR, PROTIME in the last 168 hours.  CBC: Recent Labs  Lab 07/14/20 0036 07/14/20 0048  WBC 17.4*  --   NEUTROABS 13.1*  --   HGB 11.3* 13.3  HCT 32.4* 39.0  MCV 96.4  --   PLT 274  --    Cardiac  Enzymes: No results for input(s): CKTOTAL, CKMB, CKMBINDEX, TROPONINI in the last 168 hours. BNP: Invalid input(s): POCBNP CBG: No results for input(s): GLUCAP in the last 168 hours. D-Dimer No results for input(s): DDIMER in the last 72 hours. Hgb A1c No results for input(s): HGBA1C in the last 72 hours. Lipid Profile No results for input(s): CHOL, HDL, LDLCALC, TRIG, CHOLHDL, LDLDIRECT in the last 72 hours. Thyroid function studies No results for input(s): TSH, T4TOTAL, T3FREE, THYROIDAB in the last 72 hours.  Invalid input(s): FREET3 Anemia work up No results for input(s): VITAMINB12, FOLATE, FERRITIN, TIBC, IRON, RETICCTPCT in the last 72 hours. Microbiology Recent Results (from the past 240 hour(s))  Resp Panel by RT-PCR (Flu A&B, Covid) Nasopharyngeal Swab     Status: None   Collection Time: 07/14/20  3:27 AM   Specimen: Nasopharyngeal Swab; Nasopharyngeal(NP) swabs in vial transport medium  Result Value Ref Range Status   SARS Coronavirus 2 by RT PCR NEGATIVE NEGATIVE Final    Comment: (NOTE) SARS-CoV-2 target nucleic acids are NOT DETECTED.  The SARS-CoV-2 RNA is generally detectable in upper respiratory specimens during the acute phase of infection. The  lowest concentration of SARS-CoV-2 viral copies this assay can detect is 138 copies/mL. A negative result does not preclude SARS-Cov-2 infection and should not be used as the sole basis for treatment or other patient management decisions. A negative result may occur with  improper specimen collection/handling, submission of specimen other than nasopharyngeal swab, presence of viral mutation(s) within the areas targeted by this assay, and inadequate number of viral copies(<138 copies/mL). A negative result must be combined with clinical observations, patient history, and epidemiological information. The expected result is Negative.  Fact Sheet for Patients:  EntrepreneurPulse.com.au  Fact Sheet for  Healthcare Providers:  IncredibleEmployment.be  This test is no t yet approved or cleared by the [place] FDA and  has been authorized for detection and/or diagnosis of SARS-CoV-2 by FDA under an Emergency Use Authorization (EUA). This EUA will remain  in effect (meaning this test can be used) for the duration of the COVID-19 declaration under Section 564(b)(1) of the Act, 21 U.S.C.section 360bbb-3(b)(1), unless the authorization is terminated  or revoked sooner.       Influenza A by PCR NEGATIVE NEGATIVE Final   Influenza B by PCR NEGATIVE NEGATIVE Final    Comment: (NOTE) The Xpert Xpress SARS-CoV-2/FLU/RSV plus assay is intended as an aid in the diagnosis of influenza from Nasopharyngeal swab specimens and should not be used as a sole basis for treatment. Nasal washings and aspirates are unacceptable for Xpert Xpress SARS-CoV-2/FLU/RSV testing.  Fact Sheet for Patients: EntrepreneurPulse.com.au  Fact Sheet for Healthcare Providers: IncredibleEmployment.be  This test is not yet approved or cleared by the [place] FDA and has been authorized for detection and/or diagnosis of SARS-CoV-2 by FDA under an Emergency Use Authorization (EUA). This EUA will remain in effect (meaning this test can be used) for the duration of the COVID-19 declaration under Section 564(b)(1) of the Act, 21 U.S.C. section 360bbb-3(b)(1), unless the authorization is terminated or revoked.  Performed at [facility], [place] [address] [place], [place] [address]      Discharge Instructions:   Discharge Instructions     Diet general   Complete by: As directed    Discharge instructions   Complete by: As directed    Dr. Rush Landmark GI within 1 week.  Take the medication as prescribed.  Avoid food that's can cause increased acidity including caffeinated beverages, carbonated drinks.  Take Protonix daily.   Increase  activity slowly   Complete by: As directed       Allergies as of 07/14/2020       Reactions   Vicodin [hydrocodone-acetaminophen] Anaphylaxis        Medication List     TAKE these medications    chlorproMAZINE 25 MG tablet Commonly known as: THORAZINE Take 1 tablet (25 mg total) by mouth 3 (three) times daily as needed for hiccoughs.   dexamethasone 4 MG tablet Commonly known as: DECADRON Take 2 tablets (8 mg total) by mouth 2 (two) times daily.   gabapentin 100 MG capsule Commonly known as: NEURONTIN Take 1 capsule (100 mg total) by mouth 3 (three) times daily.   levETIRAcetam 1000 MG tablet Commonly known as: KEPPRA Take 1 tablet (1,000 mg total) by mouth 2 (two) times daily.   ondansetron 8 MG tablet Commonly known as: Zofran Take 1 tablet (8 mg total) by mouth 2 (two) times daily as needed (nausea and vomiting). May take 30-60 minutes prior to Temodar administration if nausea/vomiting occurs.   pantoprazole 40 MG tablet Commonly known as: PROTONIX  Take 1 tablet (40 mg total) by mouth 2 (two) times daily. Take 30-60 minutes before breakfast and dinner          Time coordinating discharge: 39 minutes  Signed:  Mirissa Lopresti  [facility] 07/14/2020, 1:10 PM

## 2020-07-14 NOTE — ED Notes (Signed)
Assisted patient ot BS commode

## 2020-07-14 NOTE — Consult Note (Signed)
Referring Provider:  [facility]         Primary Care Physician:  Pcp, No Primary Gastroenterologist: Justice Britain, MD             We were asked to see this patient for:   Belching, elevated liver chemistries.             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   # Abnormal liver chemistries. Hospitalized in December with markedly elevated liver chemistries. We have been following him in clinic. Recent liver biopsy suggests drug induced liver injury. His serum ferritin is elevated. Iron overload on recent liver biopsy.  -His liver enzymes have fluctuated with mild worsening since 06/27/20 despite holding of chemotherapy in mid January. However, cholestasis continues to slowly improve. Drug induced liver injury can take many weeks to resolve. No evidence for bile duct obstruction on MRCP --Avoid Hepatotoxic medications.   # GERD with regurgitation and chronic hiccups, started several months ago. Hiccups interfere with his breathing sometimes. Tried on several agent without improvement. Workup has included CXR, CT scan of abd and head   --There has been variable compliance with BID PPI. Continue BID PPI in hospital and upon discharge.  --Anti-reflux measures.    # Constipation, uses Dulcolax as needed   HPI:                                                                                                                             Chief Complaint:  Belching and hiccups  John Watson is a 60 y.o. male with glioblastoma diagnosed Sept 2021 undergoing chemotherapy. He also has a history of a CVA, seizures, cholelithiasis, and hyperlipidemia. Seen in hospital ( by Dr. Collene Mares covering for our practice) mid December for elevated liver chemistries.with bilirubin in the 20s, ALT 1800s,  AST in 500's and alk phos in 400s. . Gallstones on CT scan but no evidence for CBD stones.  Acute hepatitis serologies were negative. Abnormal liver chemistries felt to be related to chemotherapy. His ferritin was high   Patient established care in our office on 06/11/20 for evaluation of abnormal liver chemistries, bloating, belching and constipation. His liver chemistries had been slowly improving . We repeated his liver chemistries, obtained a hepatic serologic workup, MRCP and started him on pantoprazole and Miralax. Serologic evaluation unremarkable. CMV and HSV negative.  His liver chemistries has worsened. MRCP unrevealing.  Patient underwent liver biopsy which suggested drug induced liver injury. Chemotherapy placed on hold. Liver chemistries trended down, we have been trending them outpatient.   Patient came to ED with hiccups and belching. He frequently regurgitates food into his mouth. He has a hard time catching his breath sometimes during a hiccup. Patient gives a history of constipation, takes Dulcolax as needed. No other GI complaints. n ED his liver enzymes are slightly  worse than on 1/28 but cholestasis continues to improve. N    Results for John Watson, John Watson (MRN [id number]) as of 07/14/2020 10:49  Ref. Range 06/27/2020 10:16 07/14/2020 00:36  Alkaline Phosphatase Latest Ref Range: 38 - 126 U/L 391 (H) 384 (H)  Albumin Latest Ref Range: 3.5 - 5.0 g/dL 3.2 (L) 2.4 (L)  AST Latest Ref Range: 15 - 41 U/L 84 (H) 146 (H)  ALT Latest Ref Range: 0 - 44 U/L 296 (H) 353 (H)  Total Protein Latest Ref Range: 6.5 - 8.1 g/dL 5.3 (L) 5.4 (L)  Bilirubin, Direct Latest Ref Range: 0.0 - 0.2 mg/dL 12.4 (H) 14.9 (H)  Indirect Bilirubin Latest Ref Range: 0.3 - 0.9 mg/dL  5.8 (H)  Total Bilirubin Latest Ref Range: 0.3 - 1.2 mg/dL 26.5 (H) 20.7 (HH)     PREVIOUS ENDOSCOPIC EVALUATIONS / PERTINENT STUDIES    06/23/20 MRCP IMPRESSION: 1. No evidence of biliary obstruction. No intrahepatic or extrahepatic duct dilatation. Normal common bile duct. 2. Gallbladder collapsed. 3. No hepatic  parenchymal abnormality other than multiple small benign hepatic cysts. 4. Normal pancreas. 5. No explanation for elevated bilirubin.    Liver biopsy 07/02/20 FINAL MICROSCOPIC DIAGNOSIS:   A. LIVER, RIGHT, NEEDLE CORE BIOPSY:  - Moderate to severe cholestasis and minimal hepatitis (cholestatic  hepatitis).  - No pathologic fibrosis   COMMENT:   The biopsy is adequate for review and shows severe centrilobular  canalicular and hepatocellular cholestasis with accompanying focal  feathery degeneration and focal necrosis of hepatocytes, mostly in the  centrilobular zones. The degree of cholestasis is striking in comparison  to the mild amount of lobular and portal-based inflammation. The  inflammatory infiltrate is comprised primarily of lymphocytes with  occasional neutrophils and plasma cells. There is no interface hepatitis  or ductular reaction. Native bile ducts are preserved and unremarkable.  Trichrome and reticulin stains show immature collagen deposition mostly  within centrilobular zones without significant portal fibrosis. Iron  stain shows significant granular iron deposition within macrophages and  Kupffer cells, consistent with secondary iron overload. PASD stain  highlights numerous lobular and portal ceroid-laden macrophages. Taken  together, the overall features are best characterized as cholestatic  hepatitis. The differential diagnosis mainly includes drug induced liver  injury, including over-the-counter and herbal products. Though less  likely, differential diagnosis can include infectious etiologies.  Clinical and surgical correlation is suggested.     Past Medical History:  Diagnosis Date  . Glioblastoma ([place]) 02/13/2020  . Pneumonia 2012  . Seizure ([place]) 8/16/211   last one 02/02/2020  . Stroke [facility])    Left leg weakness    Past Surgical History:  Procedure Laterality Date  . APPLICATION OF CRANIAL NAVIGATION Right 02/13/2020   Procedure:  APPLICATION OF CRANIAL NAVIGATION;  Surgeon: [name], [name], DO;  Location: [place];  Service: Neurosurgery;  Laterality: Right;  APPLICATION OF CRANIAL NAVIGATION  . BUBBLE STUDY  01/17/2020   Procedure: BUBBLE STUDY;  Surgeon: [name], MD;  Location: [place];  Service: Cardiovascular;;  . FRAMELESS  BIOPSY WITH BRAINLAB Right 02/13/2020   Procedure: STEREOTACTIC BRAIN BIOPSY OF RIGHT SIDED LESION;  Surgeon: [name], DO;  Location: [place];  Service: Neurosurgery;  Laterality: Right;  STEREOTACTIC BRAIN BIOPSY OF RIGHT SIDED LESION  . LOOP RECORDER INSERTION N/A 01/17/2020   Procedure: LOOP RECORDER INSERTION;  Surgeon: [name], MD;  Location: [place] CV LAB;  Service: Cardiovascular;  Laterality: N/A;  . TEE WITHOUT CARDIOVERSION N/A 01/17/2020  Procedure: TRANSESOPHAGEAL ECHOCARDIOGRAM (TEE);  Surgeon: [name], MD;  [facility] ENDOSCOPY;  Service: Cardiovascular;  Laterality: N/A;    Prior to Admission medications   Medication Sig Start Date End Date Taking? Authorizing Provider  chlorproMAZINE (THORAZINE) 25 MG tablet Take 1 tablet (25 mg total) by mouth 3 (three) times daily as needed for hiccoughs. 06/06/20  Yes Vaslow, Acey Lav, MD  dexamethasone (DECADRON) 4 MG tablet Take 2 tablets (8 mg total) by mouth 2 (two) times daily. 07/08/20  Yes Vaslow, Acey Lav, MD  levETIRAcetam (KEPPRA) 1000 MG tablet Take 1 tablet (1,000 mg total) by mouth 2 (two) times daily. 03/27/20 04/26/20 Yes Vaslow, Acey Lav, MD  ondansetron (ZOFRAN) 8 MG tablet Take 1 tablet (8 mg total) by mouth 2 (two) times daily as needed (nausea and vomiting). May take 30-60 minutes prior to Temodar administration if nausea/vomiting occurs. 03/07/20  Yes Vaslow, Acey Lav, MD  pantoprazole (PROTONIX) 40 MG tablet Take 1 tablet (40 mg total) by mouth 2 (two) times daily. Take 30-60 minutes before breakfast and dinner 06/11/20  Yes Levin Erp, PA    Current  Facility-Administered Medications  Medication Dose Route Frequency Provider Last Rate Last Admin  . 0.9 %  sodium chloride infusion   Intravenous Continuous Shalhoub, Sherryll Burger, MD 125 mL/hr at 07/14/20 0559 New Bag at 07/14/20 0559  . dexamethasone (DECADRON) tablet 8 mg  8 mg Oral BID Shalhoub, Sherryll Burger, MD      . enoxaparin (LOVENOX) injection 40 mg  40 mg Subcutaneous Q24H Shalhoub, Sherryll Burger, MD      . gabapentin (NEURONTIN) capsule 100 mg  100 mg Oral TID Vernelle Emerald, MD   100 mg at 07/14/20 0600  . ibuprofen (ADVIL) tablet 400 mg  400 mg Oral Q6H PRN Vernelle Emerald, MD      . levETIRAcetam (KEPPRA) tablet 1,000 mg  1,000 mg Oral BID Shalhoub, Sherryll Burger, MD      . ondansetron [facility]) tablet 4 mg  4 mg Oral Q6H PRN Shalhoub, Sherryll Burger, MD       Or  . ondansetron [facility]) injection 4 mg  4 mg Intravenous Q6H PRN Shalhoub, Sherryll Burger, MD      . pantoprazole (PROTONIX) EC tablet 40 mg  40 mg Oral BID AC Shalhoub, Sherryll Burger, MD   40 mg at 07/14/20 0842  . polyethylene glycol (MIRALAX / GLYCOLAX) packet 17 g  17 g Oral Daily PRN Shalhoub, Sherryll Burger, MD       Current Outpatient Medications  Medication Sig Dispense Refill  . chlorproMAZINE (THORAZINE) 25 MG tablet Take 1 tablet (25 mg total) by mouth 3 (three) times daily as needed for hiccoughs. 60 tablet 1  . dexamethasone (DECADRON) 4 MG tablet Take 2 tablets (8 mg total) by mouth 2 (two) times daily. 90 tablet 1  . levETIRAcetam (KEPPRA) 1000 MG tablet Take 1 tablet (1,000 mg total) by mouth 2 (two) times daily. 60 tablet 3  . ondansetron (ZOFRAN) 8 MG tablet Take 1 tablet (8 mg total) by mouth 2 (two) times daily as needed (nausea and vomiting). May take 30-60 minutes prior to Temodar administration if nausea/vomiting occurs. 30 tablet 1  . pantoprazole (PROTONIX) 40 MG tablet Take 1 tablet (40 mg total) by mouth 2 (two) times daily. Take 30-60 minutes before breakfast and dinner 60 tablet 5    Allergies as of 07/14/2020 - Review  Complete 07/14/2020  Allergen Reaction Noted  . Vicodin [hydrocodone-acetaminophen] Anaphylaxis 03/06/2020  Family History  Problem Relation Age of Onset  . Hypertension Mother   . Diabetes Mother   . Lung cancer Father   . Hypertension Sister   . Prostate cancer Brother   . Prostate cancer Paternal Grandfather   . Hypertension Brother   . Hypertension Brother   . Hyperlipidemia Brother     Social History   Socioeconomic History  . Marital status: Single    Spouse name: Not on file  . Number of children: 1  . Years of education: Not on file  . Highest education level: Not on file  Occupational History  . Not on file  Tobacco Use  . Smoking status: Former Smoker    Years: 2.00    Types: Cigars    Quit date: 2002    Years since quitting: 20.1  . Smokeless tobacco: Never Used  Vaping Use  . Vaping Use: Never used  Substance and Sexual Activity  . Alcohol use: Not Currently  . Drug use: No  . Sexual activity: Yes    Birth control/protection: None  Other Topics Concern  . Not on file  Social History Narrative  . Not on file   Social Determinants of Health   Financial Resource Strain: Not on file  Food Insecurity: Not on file  Transportation Needs: Not on file  Physical Activity: Not on file  Stress: Not on file  Social Connections: Not on file  Intimate Partner Violence: Not on file    Review of Systems: All systems reviewed and negative except where noted in HPI.  OBJECTIVE:    Physical Exam: Vital signs in last 24 hours: Temp:  [98.1 F (36.7 C)] 98.1 F (36.7 C) (02/14 0019) Pulse Rate:  [47-83] 83 (02/14 1000) Resp:  [11-18] 15 (02/14 1000) BP: (119-151)/(80-92) 132/84 (02/14 1000) SpO2:  [96 %-100 %] 97 % (02/14 1000) Weight:  [72.6 kg] 72.6 kg (02/14 0019)   General:   Alert  male in NAD Psych:  Pleasant, cooperative. Normal mood and affect. Eyes:  Pupils equal, icteric sclerae. Ears:  Normal auditory acuity. Nose:  No deformity,  discharge,  or lesions. Neck:  Supple; no masses Lungs:  Clear throughout to auscultation.   No wheezes, crackles, or rhonchi.  Heart:  Regular rate and rhythm; no murmurs, no lower extremity edema Abdomen:  Soft, non-distended, nontender, BS active, no palp mass   Rectal:  Deferred  Msk:  Symmetrical without gross deformities. . Neurologic:  Alert and  oriented x4;  grossly normal neurologically. Skin:  Intact without significant lesions or rashes.  Filed Weights   07/14/20 0019  Weight: 72.6 kg     Scheduled inpatient medications . dexamethasone  8 mg Oral BID  . enoxaparin (LOVENOX) injection  40 mg Subcutaneous Q24H  . gabapentin  100 mg Oral TID  . levETIRAcetam  1,000 mg Oral BID  . pantoprazole  40 mg Oral BID AC      Intake/Output from previous day: No intake/output data recorded. Intake/Output this shift: No intake/output data recorded.   Lab Results: Recent Labs    07/14/20 0036 07/14/20 0048  WBC 17.4*  --   HGB 11.3* 13.3  HCT 32.4* 39.0  PLT 274  --    BMET Recent Labs    07/14/20 0048  NA 138  K 4.1  CL 105  GLUCOSE 186*  BUN 23*  CREATININE 1.20   LFT Recent Labs    07/14/20 0036  PROT 5.4*  ALBUMIN 2.4*  AST 146*  ALT  353*  ALKPHOS 384*  BILITOT 20.7*  BILIDIR 14.9*  IBILI 5.8*   PT/INR No results for input(s): LABPROT, INR in the last 72 hours. Hepatitis Panel No results for input(s): HEPBSAG, HCVAB, HEPAIGM, HEPBIGM in the last 72 hours.   . CBC Latest Ref Rng & Units 07/14/2020 07/14/2020 07/02/2020  WBC 4.0 - 10.5 K/uL - 17.4(H) 7.9  Hemoglobin 13.0 - 17.0 g/dL 13.3 11.3(L) 11.3(L)  Hematocrit 39.0 - 52.0 % 39.0 32.4(L) 32.5(L)  Platelets 150 - 400 K/uL - 274 198    . CMP Latest Ref Rng & Units 07/14/2020 06/27/2020 06/20/2020  Glucose 70 - 99 mg/dL 186(H) - -  BUN 6 - 20 mg/dL 23(H) - -  Creatinine 0.61 - 1.24 mg/dL 1.20 - -  Sodium 135 - 145 mmol/L 138 - -  Potassium 3.5 - 5.1 mmol/L 4.1 - -  Chloride 98 - 111  mmol/L 105 - -  CO2 19 - 32 mEq/L - - -  Calcium 8.4 - 10.5 mg/dL - - -  Total Protein 6.5 - 8.1 g/dL 5.4(L) 5.3(L) 5.5(L)  Total Bilirubin 0.3 - 1.2 mg/dL 20.7(HH) 26.5(H) 27.5(H)  Alkaline Phos 38 - 126 U/L 384(H) 391(H) 466(H)  AST 15 - 41 U/L 146(H) 84(H) 124(H)  ALT 0 - 44 U/L 353(H) 296(H) 544(H)   Studies/Results: DG Neck Soft Tissue  Result Date: 07/14/2020 CLINICAL DATA:  Soft tissues, unable to stop belching. EXAM: NECK SOFT TISSUES - 1+ VIEW COMPARISON:  None. FINDINGS: There is no evidence of retropharyngeal soft tissue swelling or epiglottic enlargement. The cervical airway is unremarkable and no radio-opaque foreign body identified. Degenerative changes of the cervical spine. IMPRESSION: Negative. Electronically Signed   By: Iven Finn M.D.   On: 07/14/2020 05:53   CT Head Wo Contrast  Result Date: 07/14/2020 CLINICAL DATA:  Brain mass or lesion.  History of GBM EXAM: CT HEAD WITHOUT CONTRAST TECHNIQUE: Contiguous axial images were obtained from the base of the skull through the vertex without intravenous contrast. COMPARISON:  Brain MRI from 3 days ago FINDINGS: Brain: Known necrotic mass in the high and parasagittal right frontal lobe with adjacent white matter low-density and swelling. On axial slices, the mass measures up to 4.6 x 2.8 cm. No hemorrhage, hydrocephalus, or superimposed infarct. Some of the elevated density around the mass is attributable to a recent abdominal CT with contrast. Contiguous but separate appearing cortical and subcortical low-density at a right parietal gyrus with appearance of nonenhancing tumor by prior MRI. Vascular: No hyperdense vessel or unexpected calcification. Skull: Normal. Negative for fracture or focal lesion. Sinuses/Orbits: No acute finding. IMPRESSION: Known right cerebral GBM.  No developments since a MRI 3 days ago. Electronically Signed   By: Monte Fantasia M.D.   On: 07/14/2020 04:42   CT ABDOMEN PELVIS W CONTRAST  Result  Date: 07/14/2020 CLINICAL DATA:  Epigastric pain.  Elevated LFTs.  Belching. EXAM: CT ABDOMEN AND PELVIS WITH CONTRAST TECHNIQUE: Multidetector CT imaging of the abdomen and pelvis was performed using the standard protocol following bolus administration of intravenous contrast. CONTRAST:  185m OMNIPAQUE IOHEXOL 300 MG/ML  SOLN COMPARISON:  CT 05/16/2020.  Abdominal MRI 06/23/2020 FINDINGS: Lower chest: Lingular atelectasis.  No pleural fluid. Hepatobiliary: Scattered small low-density lesions throughout the liver previously characterized as cysts. Collapsed gallbladder. No intra or extrahepatic biliary ductal dilatation. No pericholecystic fat stranding or calcified gallstone. Pancreas: Unremarkable. No pancreatic ductal dilatation or surrounding inflammatory changes. Spleen: Normal in size without focal abnormality. Adrenals/Urinary Tract: Normal adrenal  glands. No hydronephrosis or perinephric edema. Tiny low-density lesion in the lower left kidney may contain fat density and represent an angiomyolipoma. Urinary bladder is completely decompressed. Stomach/Bowel: Ingested material within the stomach. No gastric wall thickening. Normal positioning of the duodenum. No small bowel obstruction or inflammation. Cecum is slightly high-riding in the mid abdomen. Normal appendix courses centrally. Moderate volume of stool throughout the colon. No colonic wall thickening or inflammation. Vascular/Lymphatic: Aortic atherosclerosis. No aneurysm. No acute vascular findings. Patent portal vein. No enlarged lymph nodes in the abdomen or pelvis. Reproductive: Prominent prostate gland spans 6 cm transverse. Other: No intra-abdominal ascites. No free air or focal fluid collection. Musculoskeletal: No focal bone lesion or acute osseous abnormality. IMPRESSION: 1. No acute abnormality in the abdomen/pelvis. No explanation for symptoms. 2. Scattered low-density lesions throughout the liver previously characterized as cysts. 3. Tiny  low-density lesion in the lower left kidney may contain fat density and represent cyst or an angiomyolipoma. 4. Enlarged prostate gland. Aortic Atherosclerosis (ICD10-I70.0). Electronically Signed   By: Keith Rake M.D.   On: 07/14/2020 02:45   DG Chest Portable 1 View  Result Date: 07/14/2020 CLINICAL DATA:  Belching. EXAM: PORTABLE CHEST 1 VIEW COMPARISON:  Chest CT 05/16/2020 FINDINGS: Implanted loop recorder projects over the left chest. The heart is normal in size with normal mediastinal contours. No acute or focal airspace disease. No pleural fluid or pulmonary edema. No pneumothorax. No pulmonary mass on portable exam. No acute osseous abnormalities are seen. IMPRESSION: No acute chest findings. Electronically Signed   By: Keith Rake M.D.   On: 07/14/2020 00:46    Principal Problem:   Choking sensation Active Problems:   Seizure disorder (HCC)   Glioblastoma of frontal lobe ([facility])   Intractable hiccups   GERD without esophagitis   Drug-induced cholestatic hepatitis    Tye Savoy, NP-C @  07/14/2020, 10:37 AM

## 2020-07-14 NOTE — ED Triage Notes (Signed)
Pt reports non-stop belching since 02/13/20. Pt says it is on going to the point where he cannot catch his breath or clear throat. Pt receiving chemo for a brain tumor. Recent hx of elevated LFT's.

## 2020-07-14 NOTE — ED Notes (Signed)
Date and time results received: 07/14/20 1:33 AM (use smartphrase ".now" to insert current time)  Test: Total Billirubin Critical Value: 20.7  Name of Provider Notified: Randal Buba, MD  Orders Received? Or Actions Taken?: Orders Received - See Orders for details

## 2020-07-14 NOTE — Progress Notes (Signed)
SLP Cancellation Note  Patient Details Name: John Watson MRN: [id number] DOB: 08/18/1960   Cancelled treatment:       Reason Eval/Treat Not Completed: SLP screened, no needs identified, will sign off (Patient discharging today; seen by GI and MD in agreement that BSE from SLP not needed.)   Sonia Baller, MA, CCC-SLP Speech Therapy

## 2020-07-14 NOTE — ED Notes (Signed)
Called patient's brother for ride home

## 2020-07-14 NOTE — ED Notes (Signed)
BC x2 drawn and sent

## 2020-07-14 NOTE — H&P (Signed)
History and Physical    John Watson DGU:[id number] DOB: 11/26/1960 DOA: 07/14/2020  PCP: Pcp, No  Patient coming from: Home   Chief Complaint:  Chief Complaint  Patient presents with  . 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  Patient explains that ever since his diagnosis of glioblastoma in September 2021 he has been having recurrent bouts of hiccups and belching.  In the months that have followed, patient has been placed on various agents for his symptoms including Thorazine, Protonix, simethicone and baclofen.  Patient reports no improvement in symptoms despite these various agents.  Patient states that he experiences frequent episodes of intense hiccups followed by belching.  These episodes are so dramatic that he feels like he is choking to the point of "feeling like am going to die."  Patient denies any associated chest pain or abdominal pain with these episodes.  Patient states that these episodes tend to happen more more frequently as each day progresses.  Patient states that these episodes can occur at anytime of day however and frequently wake him from sleep.  Of note, patient reports frequent bouts of severe reflux, typically after ingesting a meal.  Patient states that typically after eating a meal food that he has just eaten will be regurgitated back up into his mouth that he has to swallow all over again.  Patient has been prescribed Protonix twice daily for this however he has been intermittently compliant with this.  Patient's episodes of intense hiccups and choking and become more and more severe in the past several weeks despite frequent correspondence  with his gastroenterologist Dr. Rush Landmark concerning this.  Patient therefore presented to [facility] emergency department for evaluation.  Upon evaluation in the emergency department patient has undergone a thorough evaluation including CT imaging of the abdomen and pelvis, CT imaging of the head and chest x-ray all of which does not explain the etiology or intensity of his symptoms.  The hospitalist group has been called to assist the patient for admission the hospital due to concerns for ongoing symptoms.  Review of Systems:   Review of Systems  Constitutional: Positive for malaise/fatigue.  Respiratory: Positive for shortness of breath.   Neurological: Positive for dizziness, focal weakness and weakness.  All other systems reviewed and are negative.   Past Medical History:  Diagnosis Date  . Glioblastoma ([place]) 02/13/2020  . Pneumonia 2012  . Seizure ([place]) 8/16/211   last one 02/02/2020  . Stroke [facility])    Left leg weakness    Past Surgical History:  Procedure Laterality Date  . APPLICATION OF CRANIAL NAVIGATION Right 02/13/2020   Procedure: APPLICATION OF CRANIAL NAVIGATION;  Surgeon: [name], [name], DO;  Location: [place];  Service: Neurosurgery;  Laterality: Right;  APPLICATION OF CRANIAL NAVIGATION  . BUBBLE STUDY  01/17/2020   Procedure: BUBBLE STUDY;  Surgeon: [name], MD;  Location: [place];  Service: Cardiovascular;;  . FRAMELESS  BIOPSY WITH BRAINLAB Right 02/13/2020   Procedure: STEREOTACTIC BRAIN BIOPSY OF RIGHT SIDED LESION;  Surgeon: [name], DO;  Location: [place];  Service: Neurosurgery;  Laterality: Right;  STEREOTACTIC BRAIN BIOPSY OF RIGHT SIDED LESION  . LOOP RECORDER INSERTION N/A 01/17/2020  Procedure: LOOP RECORDER INSERTION;  Surgeon: [name], MD;  Location: [place] CV LAB;  Service: Cardiovascular;  Laterality: N/A;  . TEE WITHOUT CARDIOVERSION N/A 01/17/2020   Procedure: TRANSESOPHAGEAL ECHOCARDIOGRAM (TEE);   Surgeon: [name], MD;  Location: [facility] ENDOSCOPY;  Service: Cardiovascular;  Laterality: N/A;     reports that he quit smoking about 20 years ago. His smoking use included cigars. He quit after 2.00 years of use. He has never used smokeless tobacco. He reports previous alcohol use. He reports that he does not use drugs.  Allergies  Allergen Reactions  . Vicodin [Hydrocodone-Acetaminophen] Anaphylaxis    Family History  Problem Relation Age of Onset  . Hypertension Mother   . Diabetes Mother   . Lung cancer Father   . Hypertension Sister   . Prostate cancer Brother   . Prostate cancer Paternal Grandfather   . Hypertension Brother   . Hypertension Brother   . Hyperlipidemia Brother      Prior to Admission medications   Medication Sig Start Date End Date Taking? Authorizing Provider  chlorproMAZINE (THORAZINE) 25 MG tablet Take 1 tablet (25 mg total) by mouth 3 (three) times daily as needed for hiccoughs. 06/06/20  Yes Vaslow, Acey Lav, MD  dexamethasone (DECADRON) 4 MG tablet Take 2 tablets (8 mg total) by mouth 2 (two) times daily. 07/08/20  Yes Vaslow, Acey Lav, MD  levETIRAcetam (KEPPRA) 1000 MG tablet Take 1 tablet (1,000 mg total) by mouth 2 (two) times daily. 03/27/20 04/26/20 Yes Vaslow, Acey Lav, MD  ondansetron (ZOFRAN) 8 MG tablet Take 1 tablet (8 mg total) by mouth 2 (two) times daily as needed (nausea and vomiting). May take 30-60 minutes prior to Temodar administration if nausea/vomiting occurs. 03/07/20  Yes Vaslow, Acey Lav, MD  pantoprazole (PROTONIX) 40 MG tablet Take 1 tablet (40 mg total) by mouth 2 (two) times daily. Take 30-60 minutes before breakfast and dinner 06/11/20  Yes Levin Erp, [place]    Physical Exam: Vitals:   07/14/20 0100 07/14/20 0245 07/14/20 0247 07/14/20 0300  BP: 134/83 126/80 126/80 123/84  Pulse: 71 (!) 48 (!) 52 (!) 53  Resp: 18 12 14 13   Temp:      TempSrc:      SpO2: 99% 99% 100% 97%  Weight:      Height:         Constitutional: Lethargic but arousable and oriented x3.  No associated distress.   Skin: Profound jaundice noted.  Notable poor skin turgor.  No rashes, no lesions Eyes: Pupils are equally reactive to light.  Extensive scleral icterus noted without conjunctival pallor. ENMT: Somewhat dry mucous membranes noted.  Posterior pharynx clear of any exudate or lesions.   Neck: normal, supple, no masses, no thyromegaly.  No evidence of jugular venous distension.   Respiratory: clear to auscultation bilaterally, no wheezing, no crackles. Normal respiratory effort. No accessory muscle use.  Cardiovascular: Regular rate and rhythm, no murmurs / rubs / gallops. No extremity edema. 2+ pedal pulses. No carotid bruits.  Chest:   Nontender without crepitus or deformity.   Back:   Nontender without crepitus or deformity. Abdomen: Abdomen is soft and nontender.  No evidence of intra-abdominal masses.  Positive bowel sounds noted in all quadrants.   Musculoskeletal: No joint deformity upper and lower extremities. Good ROM, no contractures. Normal muscle tone.  Neurologic: CN 2-12 grossly intact. Sensation intact.  Patient moving all 4 extremities spontaneously.  Patient is following all commands.  Patient is  responsive to verbal stimuli.   Psychiatric: Patient exhibits normal mood with appropriate affect.  Patient seems to possess insight as to their current situation.     Labs on Admission: I have personally reviewed following labs and imaging studies -   CBC: Recent Labs  Lab 07/14/20 0036 07/14/20 0048  WBC 17.4*  --   NEUTROABS 13.1*  --   HGB 11.3* 13.3  HCT 32.4* 39.0  MCV 96.4  --   PLT 274  --    Basic Metabolic Panel: Recent Labs  Lab 07/14/20 0048  NA 138  K 4.1  CL 105  GLUCOSE 186*  BUN 23*  CREATININE 1.20   GFR: Estimated Creatinine Clearance: 68.1 mL/min (by C-G formula based on SCr of 1.2 mg/dL). Liver Function Tests: Recent Labs  Lab 07/14/20 0036  AST 146*  ALT  353*  ALKPHOS 384*  BILITOT 20.7*  PROT 5.4*  ALBUMIN 2.4*   No results for input(s): LIPASE, AMYLASE in the last 168 hours. No results for input(s): AMMONIA in the last 168 hours. Coagulation Profile: No results for input(s): INR, PROTIME in the last 168 hours. Cardiac Enzymes: No results for input(s): CKTOTAL, CKMB, CKMBINDEX, TROPONINI in the last 168 hours. BNP (last 3 results) No results for input(s): PROBNP in the last 8760 hours. HbA1C: No results for input(s): HGBA1C in the last 72 hours. CBG: No results for input(s): GLUCAP in the last 168 hours. Lipid Profile: No results for input(s): CHOL, HDL, LDLCALC, TRIG, CHOLHDL, LDLDIRECT in the last 72 hours. Thyroid Function Tests: No results for input(s): TSH, T4TOTAL, FREET4, T3FREE, THYROIDAB in the last 72 hours. Anemia Panel: No results for input(s): VITAMINB12, FOLATE, FERRITIN, TIBC, IRON, RETICCTPCT in the last 72 hours. Urine analysis:    Component Value Date/Time   COLORURINE AMBER (A) 05/16/2020 1140   APPEARANCEUR CLEAR 05/16/2020 1140   LABSPEC 1.020 05/16/2020 1140   PHURINE 5.0 05/16/2020 1140   GLUCOSEU NEGATIVE 05/16/2020 1140   HGBUR NEGATIVE 05/16/2020 1140   BILIRUBINUR MODERATE (A) 05/16/2020 1140   KETONESUR NEGATIVE 05/16/2020 1140   PROTEINUR 30 (A) 05/16/2020 1140   NITRITE NEGATIVE 05/16/2020 1140   LEUKOCYTESUR NEGATIVE 05/16/2020 1140    Radiological Exams on Admission - Personally Reviewed: CT Head Wo Contrast  Result Date: 07/14/2020 CLINICAL DATA:  Brain mass or lesion.  History of GBM EXAM: CT HEAD WITHOUT CONTRAST TECHNIQUE: Contiguous axial images were obtained from the base of the skull through the vertex without intravenous contrast. COMPARISON:  Brain MRI from 3 days ago FINDINGS: Brain: Known necrotic mass in the high and parasagittal right frontal lobe with adjacent white matter low-density and swelling. On axial slices, the mass measures up to 4.6 x 2.8 cm. No hemorrhage,  hydrocephalus, or superimposed infarct. Some of the elevated density around the mass is attributable to a recent abdominal CT with contrast. Contiguous but separate appearing cortical and subcortical low-density at a right parietal gyrus with appearance of nonenhancing tumor by prior MRI. Vascular: No hyperdense vessel or unexpected calcification. Skull: Normal. Negative for fracture or focal lesion. Sinuses/Orbits: No acute finding. IMPRESSION: Known right cerebral GBM.  No developments since a MRI 3 days ago. Electronically Signed   By: Monte Fantasia M.D.   On: 07/14/2020 04:42   CT ABDOMEN PELVIS W CONTRAST  Result Date: 07/14/2020 CLINICAL DATA:  Epigastric pain.  Elevated LFTs.  Belching. EXAM: CT ABDOMEN AND PELVIS WITH CONTRAST TECHNIQUE: Multidetector CT imaging of the abdomen and pelvis was performed using the standard  protocol following bolus administration of intravenous contrast. CONTRAST:  16mL OMNIPAQUE IOHEXOL 300 MG/ML  SOLN COMPARISON:  CT 05/16/2020.  Abdominal MRI 06/23/2020 FINDINGS: Lower chest: Lingular atelectasis.  No pleural fluid. Hepatobiliary: Scattered small low-density lesions throughout the liver previously characterized as cysts. Collapsed gallbladder. No intra or extrahepatic biliary ductal dilatation. No pericholecystic fat stranding or calcified gallstone. Pancreas: Unremarkable. No pancreatic ductal dilatation or surrounding inflammatory changes. Spleen: Normal in size without focal abnormality. Adrenals/Urinary Tract: Normal adrenal glands. No hydronephrosis or perinephric edema. Tiny low-density lesion in the lower left kidney may contain fat density and represent an angiomyolipoma. Urinary bladder is completely decompressed. Stomach/Bowel: Ingested material within the stomach. No gastric wall thickening. Normal positioning of the duodenum. No small bowel obstruction or inflammation. Cecum is slightly high-riding in the mid abdomen. Normal appendix courses centrally.  Moderate volume of stool throughout the colon. No colonic wall thickening or inflammation. Vascular/Lymphatic: Aortic atherosclerosis. No aneurysm. No acute vascular findings. Patent portal vein. No enlarged lymph nodes in the abdomen or pelvis. Reproductive: Prominent prostate gland spans 6 cm transverse. Other: No intra-abdominal ascites. No free air or focal fluid collection. Musculoskeletal: No focal bone lesion or acute osseous abnormality. IMPRESSION: 1. No acute abnormality in the abdomen/pelvis. No explanation for symptoms. 2. Scattered low-density lesions throughout the liver previously characterized as cysts. 3. Tiny low-density lesion in the lower left kidney may contain fat density and represent cyst or an angiomyolipoma. 4. Enlarged prostate gland. Aortic Atherosclerosis (ICD10-I70.0). Electronically Signed   By: Keith Rake M.D.   On: 07/14/2020 02:45   DG Chest Portable 1 View  Result Date: 07/14/2020 CLINICAL DATA:  Belching. EXAM: PORTABLE CHEST 1 VIEW COMPARISON:  Chest CT 05/16/2020 FINDINGS: Implanted loop recorder projects over the left chest. The heart is normal in size with normal mediastinal contours. No acute or focal airspace disease. No pleural fluid or pulmonary edema. No pneumothorax. No pulmonary mass on portable exam. No acute osseous abnormalities are seen. IMPRESSION: No acute chest findings. Electronically Signed   By: Keith Rake M.D.   On: 07/14/2020 00:46    EKG: Personally reviewed.  Rhythm is sinus rhythm with heart rate of 63 bpm.  No dynamic ST segment changes appreciated.  Assessment/Plan Principal Problem:   Choking sensation   Patient has been experiencing frequent episodes of intense hiccups composed by deep gasping breath followed by bouts of belching  Patient states that these episodes have been ongoing and worsening since September 2021  Patient states he feels "like he is choking and is going to die."  I have observed many of these  episodes at the bedside.  Patient is still moving adequate air and oxygen saturations and vital signs are all stable.  ER provider has voiced their concern the patient is aspirating with these episodes and therefore have placed for speech therapy evaluation  Due to ER provider and patient's concern for these episodes of "choking" will place patient in observation for the next 24 hours and monitor  Patient has already been placed on a regimen of Protonix twice daily which I encouraged the patient to continue to remain compliant with  Patient is additionally been placed on simethicone and baclofen which have not been successful  I will therefore place the patient on gabapentin, starting at 100 mg 3 times daily which can be slowly titrated upwards  If this fails, patient can alternatively be tried on metoclopramide in the outpatient setting  At time of discharge patient should continue to follow-up closely  as an outpatient with gastroenterology. Active Problems:  Drug-induced cholestatic hepatitis   Impressive jaundice, scleral icterus and profoundly elevated bilirubin  Transaminases and hyperbilirubinemia have substantially improved since December but seem to be lingering as of late  Continuing steroid regimen of Decadron as prescribed in the outpatient setting  Biopsy results on 2/2 confirmed that this is likely a cholestatic hepatitis with the most likely culprit being patient's previous chemotherapy regimen  Continue supportive care  Continue outpatient follow-up with gastroenterology     Intractable hiccups   Please see assessment and plan above   Seizure disorder ([place])   Continue home regimen of Keppra    Glioblastoma of frontal lobe ([place])   Continue outpatient follow-up with [name]  Unfortunately, chemotherapy is on hiatus due to ongoing cholestatic hepatitis  Long-term prognosis is guarded    GERD without esophagitis   Continuing previously prescribed  regimen of Protonix twice daily  Encouraging patient to remain compliant with this regimen as patient has been intermittently compliant in the past  Patient reports small amounts of regurgitation back into his mouth after large meals.  I have encouraged patient to eat very small but more frequent meals  If patient continues to experience symptoms then patient may benefit from an EGD and manometry as an outpatient with gastroenterology.    Leukocytosis   Likely steroid-induced  Chest x-ray reveals no evidence of pneumonia  Urinalysis, CRP, procalcitonin pending.   Code Status:  DNR Family Communication: deferred   Status is: Observation  The patient remains OBS appropriate and will d/c before 2 midnights.  Dispo: The patient is from: Home              Anticipated d/c is to: Home              Anticipated d/c date is: 2 days              Patient currently is not medically stable to d/c.   Difficult to place patient No        Vernelle Emerald MD [facility] Pager [phone]  If 7PM-7AM, please contact night-coverage [email or web address] Use universal [place] password for that web site. If you do not have the password, please call the hospital operator.  07/14/2020, 5:48 AM

## 2020-07-14 NOTE — ED Notes (Signed)
Urine sample not yet obtained from this patient. He has a condom catheter in place, and has been instructed to let staff know when he has a sample provided.

## 2020-07-14 NOTE — ED Notes (Signed)
Patient transported to CT 

## 2020-07-14 NOTE — ED Notes (Signed)
Pt states they spoke of him going home this morning, the physician has not been back to update. Pt is in agreement for discharge, verified he will call his sister to transport him home when discharge is ready.

## 2020-07-14 NOTE — ED Notes (Signed)
Patient transported to X-ray 

## 2020-07-14 NOTE — ED Notes (Signed)
Patient verbalized understanding of dc instructions, prescriptions, follow up referrals and reasons to return to ER for reevaluation. Patient dressed and taken to triage via wheelchair, paperwork given to patient's brother who is taking patient home.

## 2020-07-14 NOTE — ED Provider Notes (Addendum)
[facility] DEPT Provider Note   CSN: [id number] Arrival date & time: 07/14/20  0014     History Chief Complaint  Patient presents with  . Shortness of Breath    John Watson is a 60 y.o. male.  The history is provided by the patient.  Shortness of Breath Severity:  Moderate Onset quality:  Gradual Timing:  Intermittent Progression:  Worsening Chronicity:  Chronic Context: not activity   Relieved by:  Nothing Worsened by:  Nothing Associated symptoms: no abdominal pain, no chest pain, no fever and no rash   Risk factors: no recent alcohol use   Patient has had intrusive belching since September, now feels like he is chocking with it.  He has also become jaundiced in the past several weeks.  No f/c/r.  No nausea or vomiting.      Past Medical History:  Diagnosis Date  . Glioblastoma ([place]) 02/13/2020  . Pneumonia 2012  . Seizure ([place]) 8/16/211   last one 02/02/2020  . Stroke [facility])    Left leg weakness    Patient Active Problem List   Diagnosis Date Noted  . Jaundice 05/16/2020  . Brain lesion 02/13/2020  . Glioblastoma of frontal lobe ([place]) 02/01/2020  . Seizures ([place]) 01/16/2020  . Hyperlipidemia LDL goal <70 01/16/2020  . Tobacco use disorder 01/16/2020  . 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 APPLICATION OF CRANIAL NAVIGATION Right 02/13/2020   Procedure: APPLICATION OF CRANIAL NAVIGATION;  Surgeon: [name], [name], DO;  Location: [facility];  Service: Neurosurgery;  Laterality: Right;  APPLICATION OF CRANIAL NAVIGATION  . BUBBLE STUDY  01/17/2020   Procedure: BUBBLE STUDY;  Surgeon: [name], MD;  Location: [place];  Service: Cardiovascular;;  . FRAMELESS  BIOPSY WITH BRAINLAB Right 02/13/2020   Procedure: STEREOTACTIC BRAIN BIOPSY OF RIGHT SIDED LESION;  Surgeon: [name], DO;  Location: [place];   Service: Neurosurgery;  Laterality: Right;  STEREOTACTIC BRAIN BIOPSY OF RIGHT SIDED LESION  . LOOP RECORDER INSERTION N/A 01/17/2020   Procedure: LOOP RECORDER INSERTION;  Surgeon: [name], MD;  Location: [place] CV LAB;  Service: Cardiovascular;  Laterality: N/A;  . TEE WITHOUT CARDIOVERSION N/A 01/17/2020   Procedure: TRANSESOPHAGEAL ECHOCARDIOGRAM (TEE);  Surgeon: [name], MD;  Location: [facility] ENDOSCOPY;  Service: Cardiovascular;  Laterality: N/A;       Family History  Problem Relation Age of Onset  . Hypertension Mother   . Diabetes Mother   . Lung cancer Father   . Hypertension Sister   . Prostate cancer Brother   . Prostate cancer Paternal Grandfather   . Hypertension Brother   . Hypertension Brother   . Hyperlipidemia Brother     Social History   Tobacco Use  . Smoking status: Former Smoker    Years: 2.00    Types: Cigars    Quit date: 2002    Years since quitting: 20.1  . Smokeless tobacco: Never Used  Vaping Use  . Vaping Use: Never used  Substance Use Topics  . Alcohol use: Not Currently  . Drug use: No    Home Medications Prior to Admission medications   Medication Sig Start Date End Date Taking? Authorizing Provider  chlorproMAZINE (THORAZINE) 25 MG tablet Take 1 tablet (25 mg total) by mouth 3 (three) times daily as needed for hiccoughs. 06/06/20   Ventura Sellers, MD  dexamethasone (DECADRON) 4 MG tablet Take 2 tablets (8 mg total) by mouth 2 (two) times daily. 07/08/20   Ventura Sellers, MD  levETIRAcetam (KEPPRA) 1000 MG tablet Take 1 tablet (1,000 mg total) by mouth 2 (two) times daily. 03/27/20 04/26/20  Ventura Sellers, MD  ondansetron (ZOFRAN) 8 MG tablet Take 1 tablet (8 mg total) by mouth 2 (two) times daily as needed (nausea and vomiting). May take 30-60 minutes prior to Temodar administration if nausea/vomiting occurs. Patient not taking: No sig reported 03/07/20   Ventura Sellers, MD  pantoprazole (PROTONIX) 40 MG  tablet Take 1 tablet (40 mg total) by mouth 2 (two) times daily. Take 30-60 minutes before breakfast and dinner Patient not taking: No sig reported 06/11/20   Levin Erp, PA    Allergies    Vicodin [hydrocodone-acetaminophen]  Review of Systems   Review of Systems  Constitutional: Negative for fever.  HENT: Negative for congestion.   Eyes: Negative for visual disturbance.  Respiratory: Positive for choking and shortness of breath.   Cardiovascular: Negative for chest pain.  Gastrointestinal: Negative for abdominal pain.  Genitourinary: Negative for difficulty urinating.  Musculoskeletal: Negative for arthralgias.  Skin: Negative for rash.  Neurological: Negative for dizziness.  Psychiatric/Behavioral: Negative for agitation.  All other systems reviewed and are negative.   Physical Exam Updated Vital Signs BP 126/80 (BP Location: Left Arm)   Pulse (!) 52   Temp 98.1 F (36.7 C) (Oral)   Resp 14   Ht 5\' 11"  (1.803 m)   Wt 72.6 kg   SpO2 100%   BMI 22.32 kg/m   Physical Exam Vitals and nursing note reviewed.  Constitutional:      General: He is not in acute distress.    Appearance: Normal appearance.  HENT:     Head: Normocephalic and atraumatic.  Eyes:     Conjunctiva/sclera: Conjunctivae normal.     Pupils: Pupils are equal, round, and reactive to light.  Cardiovascular:     Rate and Rhythm: Normal rate and regular rhythm.     Pulses: Normal pulses.     Heart sounds: Normal heart sounds.  Pulmonary:     Breath sounds: No stridor. Rhonchi present. No wheezing or rales.  Abdominal:     General: Abdomen is flat. Bowel sounds are normal.     Palpations: Abdomen is soft.     Tenderness: There is no abdominal tenderness. There is no guarding or rebound.  Musculoskeletal:        General: Normal range of motion.     Cervical back: Normal range of motion and neck supple.  Skin:    General: Skin is warm and dry.     Capillary Refill: Capillary refill takes  less than 2 seconds.  Neurological:     General: No focal deficit present.     Mental Status: He is alert and oriented to person, place, and time.     Deep Tendon Reflexes: Reflexes normal.  Psychiatric:        Mood and Affect: Mood normal.        Behavior: Behavior normal.     ED Results / Procedures / Treatments   Labs (all labs ordered are listed, but only abnormal results are displayed) Results for orders placed or performed during the hospital encounter of 07/14/20  CBC with Differential/Platelet  Result Value Ref Range   WBC 17.4 (H) 4.0 - 10.5 K/uL   RBC 3.36 (L) 4.22 - 5.81 MIL/uL   Hemoglobin  11.3 (L) 13.0 - 17.0 g/dL   HCT 32.4 (L) 39.0 - 52.0 %   MCV 96.4 80.0 - 100.0 fL   MCH 33.6 26.0 - 34.0 pg   MCHC 34.9 30.0 - 36.0 g/dL   RDW 20.2 (H) 11.5 - 15.5 %   Platelets 274 150 - 400 K/uL   nRBC 2.9 (H) 0.0 - 0.2 %   Neutrophils Relative % 74 %   Neutro Abs 13.1 (H) 1.7 - 7.7 K/uL   Lymphocytes Relative 4 %   Lymphs Abs 0.7 0.7 - 4.0 K/uL   Monocytes Relative 6 %   Monocytes Absolute 1.0 0.1 - 1.0 K/uL   Eosinophils Relative 0 %   Eosinophils Absolute 0.0 0.0 - 0.5 K/uL   Basophils Relative 1 %   Basophils Absolute 0.1 0.0 - 0.1 K/uL   WBC Morphology      MODERATE LEFT SHIFT (>5% METAS AND MYELOS,OCC PRO NOTED)   Immature Granulocytes 15 %   Abs Immature Granulocytes 2.59 (H) 0.00 - 0.07 K/uL   Polychromasia PRESENT    Target Cells PRESENT   Hepatic function panel  Result Value Ref Range   Total Protein 5.4 (L) 6.5 - 8.1 g/dL   Albumin 2.4 (L) 3.5 - 5.0 g/dL   AST 146 (H) 15 - 41 U/L   ALT 353 (H) 0 - 44 U/L   Alkaline Phosphatase 384 (H) 38 - 126 U/L   Total Bilirubin 20.7 (HH) 0.3 - 1.2 mg/dL   Bilirubin, Direct 14.9 (H) 0.0 - 0.2 mg/dL   Indirect Bilirubin 5.8 (H) 0.3 - 0.9 mg/dL  I-stat chem 8, ED (not at [facility] or [facility])  Result Value Ref Range   Sodium 138 135 - 145 mmol/L   Potassium 4.1 3.5 - 5.1 mmol/L   Chloride 105 98 - 111 mmol/L   BUN 23 (H) 6  - 20 mg/dL   Creatinine, Ser 1.20 0.61 - 1.24 mg/dL   Glucose, Bld 186 (H) 70 - 99 mg/dL   Calcium, Ion 1.19 1.15 - 1.40 mmol/L   TCO2 28 22 - 32 mmol/L   Hemoglobin 13.3 13.0 - 17.0 g/dL   HCT 39.0 39.0 - 52.0 %  Troponin I (High Sensitivity)  Result Value Ref Range   Troponin I (High Sensitivity) 6 <18 ng/L  Troponin I (High Sensitivity)  Result Value Ref Range   Troponin I (High Sensitivity) 5 <18 ng/L   MR BRAIN W WO CONTRAST  Result Date: 07/11/2020 CLINICAL DATA:  Frontal glioblastoma.  Follow-up. EXAM: MRI HEAD WITHOUT AND WITH CONTRAST TECHNIQUE: Multiplanar, multiecho pulse sequences of the brain and surrounding structures were obtained without and with intravenous contrast. CONTRAST:  35mL GADAVIST GADOBUTROL 1 MMOL/ML IV SOLN COMPARISON:  05/29/2020 FINDINGS: Brain: Slight contraction in overall dimensions and thickness of enhancing tissue related to the necrotic mass in the medial right frontoparietal junction region. Measurements today are 3.6 x 3.5 x 2.8 cm compared with 4.0 x 3.7 x 2.9 cm on 05/29/2020. Surrounding edema is similar, possibly minimally more extensive. Nonenhancing mass affecting a focal gyrus in the right parietal lobe appears the same. No evidence of growth or development of contrast enhancement. No hydrocephalus. No extra-axial collection. Post biopsy approach appears unremarkable. Vascular: Major vessels at the base of the brain show flow. Skull and upper cervical spine: Otherwise negative Sinuses/Orbits: Clear/normal Other: None IMPRESSION: 1. Slight contraction in overall dimensions and thickness of enhancing tissue related to the necrotic mass in the medial right frontoparietal junction region. Measurements today  are 3.6 x 3.5 x 2.8 cm compared with 4.0 x 3.7 x 2.9 cm on 05/29/2020. Surrounding edema is similar, possibly minimally more extensive. 2. No evidence of growth or development of contrast enhancement affecting tumor within a focal gyrus in the right  parietal lobe appears the same. Electronically Signed   By: Nelson Chimes M.D.   On: 07/11/2020 21:37   CT ABDOMEN PELVIS W CONTRAST  Result Date: 07/14/2020 CLINICAL DATA:  Epigastric pain.  Elevated LFTs.  Belching. EXAM: CT ABDOMEN AND PELVIS WITH CONTRAST TECHNIQUE: Multidetector CT imaging of the abdomen and pelvis was performed using the standard protocol following bolus administration of intravenous contrast. CONTRAST:  124mL OMNIPAQUE IOHEXOL 300 MG/ML  SOLN COMPARISON:  CT 05/16/2020.  Abdominal MRI 06/23/2020 FINDINGS: Lower chest: Lingular atelectasis.  No pleural fluid. Hepatobiliary: Scattered small low-density lesions throughout the liver previously characterized as cysts. Collapsed gallbladder. No intra or extrahepatic biliary ductal dilatation. No pericholecystic fat stranding or calcified gallstone. Pancreas: Unremarkable. No pancreatic ductal dilatation or surrounding inflammatory changes. Spleen: Normal in size without focal abnormality. Adrenals/Urinary Tract: Normal adrenal glands. No hydronephrosis or perinephric edema. Tiny low-density lesion in the lower left kidney may contain fat density and represent an angiomyolipoma. Urinary bladder is completely decompressed. Stomach/Bowel: Ingested material within the stomach. No gastric wall thickening. Normal positioning of the duodenum. No small bowel obstruction or inflammation. Cecum is slightly high-riding in the mid abdomen. Normal appendix courses centrally. Moderate volume of stool throughout the colon. No colonic wall thickening or inflammation. Vascular/Lymphatic: Aortic atherosclerosis. No aneurysm. No acute vascular findings. Patent portal vein. No enlarged lymph nodes in the abdomen or pelvis. Reproductive: Prominent prostate gland spans 6 cm transverse. Other: No intra-abdominal ascites. No free air or focal fluid collection. Musculoskeletal: No focal bone lesion or acute osseous abnormality. IMPRESSION: 1. No acute abnormality in the  abdomen/pelvis. No explanation for symptoms. 2. Scattered low-density lesions throughout the liver previously characterized as cysts. 3. Tiny low-density lesion in the lower left kidney may contain fat density and represent cyst or an angiomyolipoma. 4. Enlarged prostate gland. Aortic Atherosclerosis (ICD10-I70.0). Electronically Signed   By: Keith Rake M.D.   On: 07/14/2020 02:45   MR 3D Recon At Scanner  Result Date: 06/23/2020 CLINICAL DATA:  Elevated LFTs.  Bloating.  Jaundice. EXAM: MRI ABDOMEN WITHOUT AND WITH CONTRAST (INCLUDING MRCP) TECHNIQUE: Multiplanar multisequence MR imaging of the abdomen was performed both before and after the administration of intravenous contrast. Heavily T2-weighted images of the biliary and pancreatic ducts were obtained, and three-dimensional MRCP images were rendered by post processing. CONTRAST:  26mL GADAVIST GADOBUTROL 1 MMOL/ML IV SOLN COMPARISON:  CT 05/16/2020 FINDINGS: Lower chest:  Lung bases are clear. Hepatobiliary: Scattered small hepatic cysts within LEFT and RIGHT hepatic lobe. No intrahepatic biliary duct dilatation. The common bile duct is normal caliber. Gallbladder is collapsed. Liver parenchyma appears normal. No evidence of cirrhosis. No hepatic steatosis. Postcontrast enhanced imaging demonstrates no enhancing hepatic lesion. Pancreas: Pancreatic parenchyma appears normal. No variant pancreatic ductal anatomy. Spleen: Normal spleen. Adrenals/urinary tract: Adrenal glands and kidneys are normal. Stomach/Bowel: Stomach and limited of the small bowel is unremarkable Vascular/Lymphatic: Abdominal aortic normal caliber. No retroperitoneal periportal lymphadenopathy. Musculoskeletal: No aggressive osseous lesion IMPRESSION: 1. No evidence of biliary obstruction. No intrahepatic or extrahepatic duct dilatation. Normal common bile duct. 2. Gallbladder collapsed. 3. No hepatic parenchymal abnormality other than multiple small benign hepatic cysts. 4. Normal  pancreas. 5. No explanation for elevated bilirubin. Electronically Signed   By:  Suzy Bouchard M.D.   On: 06/23/2020 08:54   US BIOPSY (LIVER)  Result Date: 07/02/2020 INDICATION: Elevated liver function tests EXAM: Ultrasound-guided random liver biopsy MEDICATIONS: None. ANESTHESIA/SEDATION: Moderate (conscious) sedation was employed during this procedure. A total of Versed 1 mg and Fentanyl 50 mcg was administered intravenously. Moderate Sedation Time: 10 minutes. The patient's level of consciousness and vital signs were monitored continuously by radiology nursing throughout the procedure under my direct supervision. COMPLICATIONS: None immediate. PROCEDURE: Informed written consent was obtained from the patient after a thorough discussion of the procedural risks, benefits and alternatives. All questions were addressed. Maximal Sterile Barrier Technique was utilized including caps, mask, sterile gowns, sterile gloves, sterile drape, hand hygiene and skin antiseptic. A timeout was performed prior to the initiation of the procedure. Patient position supine on the ultrasound table. Right upper quadrant skin prepped and draped in usual sterile fashion. Following local lidocaine administration, Three 18 gauge cores were obtained from the right hepatic lobe utilizing continuous ultrasound guidance. Samples were sent to pathology in formalin. Needle removed and hemostasis achieved with manual compression. IMPRESSION: Ultrasound-guided random liver biopsy as above. Electronically Signed   By: Miachel Roux M.D.   On: 07/02/2020 09:27   DG Chest Portable 1 View  Result Date: 07/14/2020 CLINICAL DATA:  Belching. EXAM: PORTABLE CHEST 1 VIEW COMPARISON:  Chest CT 05/16/2020 FINDINGS: Implanted loop recorder projects over the left chest. The heart is normal in size with normal mediastinal contours. No acute or focal airspace disease. No pleural fluid or pulmonary edema. No pneumothorax. No pulmonary mass on portable  exam. No acute osseous abnormalities are seen. IMPRESSION: No acute chest findings. Electronically Signed   By: Keith Rake M.D.   On: 07/14/2020 00:46   MR ABDOMEN MRCP W WO CONTAST  Result Date: 06/23/2020 CLINICAL DATA:  Elevated LFTs.  Bloating.  Jaundice. EXAM: MRI ABDOMEN WITHOUT AND WITH CONTRAST (INCLUDING MRCP) TECHNIQUE: Multiplanar multisequence MR imaging of the abdomen was performed both before and after the administration of intravenous contrast. Heavily T2-weighted images of the biliary and pancreatic ducts were obtained, and three-dimensional MRCP images were rendered by post processing. CONTRAST:  55mL GADAVIST GADOBUTROL 1 MMOL/ML IV SOLN COMPARISON:  CT 05/16/2020 FINDINGS: Lower chest:  Lung bases are clear. Hepatobiliary: Scattered small hepatic cysts within LEFT and RIGHT hepatic lobe. No intrahepatic biliary duct dilatation. The common bile duct is normal caliber. Gallbladder is collapsed. Liver parenchyma appears normal. No evidence of cirrhosis. No hepatic steatosis. Postcontrast enhanced imaging demonstrates no enhancing hepatic lesion. Pancreas: Pancreatic parenchyma appears normal. No variant pancreatic ductal anatomy. Spleen: Normal spleen. Adrenals/urinary tract: Adrenal glands and kidneys are normal. Stomach/Bowel: Stomach and limited of the small bowel is unremarkable Vascular/Lymphatic: Abdominal aortic normal caliber. No retroperitoneal periportal lymphadenopathy. Musculoskeletal: No aggressive osseous lesion IMPRESSION: 1. No evidence of biliary obstruction. No intrahepatic or extrahepatic duct dilatation. Normal common bile duct. 2. Gallbladder collapsed. 3. No hepatic parenchymal abnormality other than multiple small benign hepatic cysts. 4. Normal pancreas. 5. No explanation for elevated bilirubin. Electronically Signed   By: Suzy Bouchard M.D.   On: 06/23/2020 08:54   CUP PACEART REMOTE DEVICE CHECK  Result Date: 07/02/2020 ILR summary report received. Battery  status OK. Normal device function. No new symptom, tachy, brady, or pause episodes. No new AF episodes. Monthly summary reports and ROV/PRN. HB   EKG  EKG Interpretation  Date/Time:  Monday July 14 2020 00:56:44 EST Ventricular Rate:  63 PR Interval:    QRS Duration:  91 QT Interval:  410 QTC Calculation: 420 R Axis:   -7 Text Interpretation: Sinus rhythm Left ventricular hypertrophy Borderline T abnormalities, inferior leads Confirmed by Randal Buba, Edina Winningham ([id number]) on 07/14/2020 3:34:17 AM       Radiology CT ABDOMEN PELVIS W CONTRAST  Result Date: 07/14/2020 CLINICAL DATA:  Epigastric pain.  Elevated LFTs.  Belching. EXAM: CT ABDOMEN AND PELVIS WITH CONTRAST TECHNIQUE: Multidetector CT imaging of the abdomen and pelvis was performed using the standard protocol following bolus administration of intravenous contrast. CONTRAST:  125mL OMNIPAQUE IOHEXOL 300 MG/ML  SOLN COMPARISON:  CT 05/16/2020.  Abdominal MRI 06/23/2020 FINDINGS: Lower chest: Lingular atelectasis.  No pleural fluid. Hepatobiliary: Scattered small low-density lesions throughout the liver previously characterized as cysts. Collapsed gallbladder. No intra or extrahepatic biliary ductal dilatation. No pericholecystic fat stranding or calcified gallstone. Pancreas: Unremarkable. No pancreatic ductal dilatation or surrounding inflammatory changes. Spleen: Normal in size without focal abnormality. Adrenals/Urinary Tract: Normal adrenal glands. No hydronephrosis or perinephric edema. Tiny low-density lesion in the lower left kidney may contain fat density and represent an angiomyolipoma. Urinary bladder is completely decompressed. Stomach/Bowel: Ingested material within the stomach. No gastric wall thickening. Normal positioning of the duodenum. No small bowel obstruction or inflammation. Cecum is slightly high-riding in the mid abdomen. Normal appendix courses centrally. Moderate volume of stool throughout the colon. No colonic wall  thickening or inflammation. Vascular/Lymphatic: Aortic atherosclerosis. No aneurysm. No acute vascular findings. Patent portal vein. No enlarged lymph nodes in the abdomen or pelvis. Reproductive: Prominent prostate gland spans 6 cm transverse. Other: No intra-abdominal ascites. No free air or focal fluid collection. Musculoskeletal: No focal bone lesion or acute osseous abnormality. IMPRESSION: 1. No acute abnormality in the abdomen/pelvis. No explanation for symptoms. 2. Scattered low-density lesions throughout the liver previously characterized as cysts. 3. Tiny low-density lesion in the lower left kidney may contain fat density and represent cyst or an angiomyolipoma. 4. Enlarged prostate gland. Aortic Atherosclerosis (ICD10-I70.0). Electronically Signed   By: Keith Rake M.D.   On: 07/14/2020 02:45   DG Chest Portable 1 View  Result Date: 07/14/2020 CLINICAL DATA:  Belching. EXAM: PORTABLE CHEST 1 VIEW COMPARISON:  Chest CT 05/16/2020 FINDINGS: Implanted loop recorder projects over the left chest. The heart is normal in size with normal mediastinal contours. No acute or focal airspace disease. No pleural fluid or pulmonary edema. No pneumothorax. No pulmonary mass on portable exam. No acute osseous abnormalities are seen. IMPRESSION: No acute chest findings. Electronically Signed   By: Keith Rake M.D.   On: 07/14/2020 00:46    Procedures Procedures   Medications Ordered in ED Medications  chlorproMAZINE (THORAZINE) tablet 25 mg (has no administration in time range)  alum & mag hydroxide-simeth (MAALOX/MYLANTA) 200-200-20 MG/5ML suspension 30 mL (30 mLs Oral Given 07/14/20 0030)  iohexol (OMNIPAQUE) 300 MG/ML solution 100 mL (100 mLs Intravenous Contrast Given 07/14/20 0222)    ED Course  I have reviewed the triage vital signs and the nursing notes.  Pertinent labs & imaging results that were available during my care of the patient were reviewed by me and considered in my medical  decision making (see chart for details).   Consult placed in Epic to GI for belching and jaundice.    John Watson was evaluated in Emergency Department on 07/14/2020 for the symptoms described in the history of present illness. He was evaluated in the context of the global COVID-19 pandemic, which necessitated consideration that the patient might be at risk for infection  with the SARS-CoV-2 virus that causes COVID-19. Institutional protocols and algorithms that pertain to the evaluation of patients at risk for COVID-19 are in a state of rapid change based on information released by regulatory bodies including the CDC and federal and state organizations. These policies and algorithms were followed during the patient's care in the ED.  Final Clinical Impression(s) / ED Diagnoses Final diagnoses:  None   Concern that patient is aspirating given history and white count.  Will admit to medicine    Zariana Strub, MD 07/14/20 2182    Randal Buba, Trula Frede, MD 07/14/20 [id number]

## 2020-07-15 LAB — URINE CULTURE: Culture: <10000 — AB

## 2020-07-17 MED FILL — CHLORPROMAZINE 25 MG TABLET: 25 | 20 days supply | Qty: 60 | Fill #0

## 2020-07-19 LAB — CULTURE, BLOOD (ROUTINE X 2)
Culture: NO GROWTH
Culture: NO GROWTH
Special Requests: ADEQUATE
Special Requests: ADEQUATE

## 2020-07-22 ENCOUNTER — Emergency Department (HOSPITAL_COMMUNITY): Payer: No Typology Code available for payment source

## 2020-07-22 ENCOUNTER — Encounter (HOSPITAL_COMMUNITY): Payer: Self-pay | Admitting: *Deleted

## 2020-07-22 ENCOUNTER — Inpatient Hospital Stay (HOSPITAL_COMMUNITY)
Admission: EM | Admit: 2020-07-22 | Discharge: 2020-07-25 | DRG: 055 | Disposition: A | Discharge status: Hospice | Payer: No Typology Code available for payment source | Attending: Family Medicine | Admitting: Family Medicine

## 2020-07-22 ENCOUNTER — Other Ambulatory Visit: Payer: Self-pay

## 2020-07-22 DIAGNOSIS — E872 Acidosis: Secondary | ICD-10-CM | POA: Diagnosis not present

## 2020-07-22 DIAGNOSIS — G40909 Epilepsy, unspecified, not intractable, without status epilepticus: Secondary | ICD-10-CM | POA: Diagnosis present

## 2020-07-22 DIAGNOSIS — K759 Inflammatory liver disease, unspecified: Secondary | ICD-10-CM

## 2020-07-22 DIAGNOSIS — T451X5A Adverse effect of antineoplastic and immunosuppressive drugs, initial encounter: Secondary | ICD-10-CM | POA: Diagnosis present

## 2020-07-22 DIAGNOSIS — Z79899 Other long term (current) drug therapy: Secondary | ICD-10-CM

## 2020-07-22 DIAGNOSIS — Z87891 Personal history of nicotine dependence: Secondary | ICD-10-CM

## 2020-07-22 DIAGNOSIS — E860 Dehydration: Secondary | ICD-10-CM | POA: Diagnosis present

## 2020-07-22 DIAGNOSIS — K625 Hemorrhage of anus and rectum: Secondary | ICD-10-CM | POA: Diagnosis present

## 2020-07-22 DIAGNOSIS — Z885 Allergy status to narcotic agent status: Secondary | ICD-10-CM

## 2020-07-22 DIAGNOSIS — R112 Nausea with vomiting, unspecified: Secondary | ICD-10-CM | POA: Diagnosis not present

## 2020-07-22 DIAGNOSIS — D649 Anemia, unspecified: Secondary | ICD-10-CM | POA: Diagnosis present

## 2020-07-22 DIAGNOSIS — C711 Malignant neoplasm of frontal lobe: Secondary | ICD-10-CM | POA: Diagnosis not present

## 2020-07-22 DIAGNOSIS — Z923 Personal history of irradiation: Secondary | ICD-10-CM

## 2020-07-22 DIAGNOSIS — R109 Unspecified abdominal pain: Secondary | ICD-10-CM | POA: Diagnosis not present

## 2020-07-22 DIAGNOSIS — Z66 Do not resuscitate: Secondary | ICD-10-CM | POA: Diagnosis present

## 2020-07-22 DIAGNOSIS — R1110 Vomiting, unspecified: Secondary | ICD-10-CM

## 2020-07-22 DIAGNOSIS — Z801 Family history of malignant neoplasm of trachea, bronchus and lung: Secondary | ICD-10-CM

## 2020-07-22 DIAGNOSIS — R634 Abnormal weight loss: Secondary | ICD-10-CM

## 2020-07-22 DIAGNOSIS — I69354 Hemiplegia and hemiparesis following cerebral infarction affecting left non-dominant side: Secondary | ICD-10-CM

## 2020-07-22 DIAGNOSIS — Z8249 Family history of ischemic heart disease and other diseases of the circulatory system: Secondary | ICD-10-CM

## 2020-07-22 DIAGNOSIS — E871 Hypo-osmolality and hyponatremia: Secondary | ICD-10-CM | POA: Diagnosis present

## 2020-07-22 DIAGNOSIS — K716 Toxic liver disease with hepatitis, not elsewhere classified: Secondary | ICD-10-CM | POA: Diagnosis not present

## 2020-07-22 DIAGNOSIS — C719 Malignant neoplasm of brain, unspecified: Secondary | ICD-10-CM | POA: Diagnosis present

## 2020-07-22 DIAGNOSIS — K921 Melena: Secondary | ICD-10-CM | POA: Diagnosis present

## 2020-07-22 DIAGNOSIS — K7589 Other specified inflammatory liver diseases: Secondary | ICD-10-CM | POA: Diagnosis present

## 2020-07-22 DIAGNOSIS — Z83438 Family history of other disorder of lipoprotein metabolism and other lipidemia: Secondary | ICD-10-CM

## 2020-07-22 DIAGNOSIS — Z20822 Contact with and (suspected) exposure to covid-19: Secondary | ICD-10-CM | POA: Diagnosis present

## 2020-07-22 DIAGNOSIS — Z515 Encounter for palliative care: Secondary | ICD-10-CM

## 2020-07-22 DIAGNOSIS — E861 Hypovolemia: Secondary | ICD-10-CM | POA: Diagnosis not present

## 2020-07-22 DIAGNOSIS — R627 Adult failure to thrive: Secondary | ICD-10-CM | POA: Diagnosis present

## 2020-07-22 DIAGNOSIS — Z7952 Long term (current) use of systemic steroids: Secondary | ICD-10-CM

## 2020-07-22 DIAGNOSIS — Z8042 Family history of malignant neoplasm of prostate: Secondary | ICD-10-CM

## 2020-07-22 DIAGNOSIS — K2100 Gastro-esophageal reflux disease with esophagitis, without bleeding: Secondary | ICD-10-CM | POA: Diagnosis present

## 2020-07-22 DIAGNOSIS — Z833 Family history of diabetes mellitus: Secondary | ICD-10-CM

## 2020-07-22 LAB — URINALYSIS, ROUTINE W REFLEX MICROSCOPIC
Bacteria, UA: NONE SEEN
Glucose, UA: NEGATIVE mg/dL
Hgb urine dipstick: NEGATIVE
Ketones, ur: NEGATIVE mg/dL
Leukocytes,Ua: NEGATIVE
Nitrite: NEGATIVE
Protein, ur: 30 mg/dL — AB
Specific Gravity, Urine: 1.043 — ABNORMAL HIGH (ref 1.005–1.030)
pH: 6 (ref 5.0–8.0)

## 2020-07-22 LAB — COMPREHENSIVE METABOLIC PANEL
ALT: 441 U/L — ABNORMAL HIGH (ref 0–44)
AST: 112 U/L — ABNORMAL HIGH (ref 15–41)
Albumin: 2.5 g/dL — ABNORMAL LOW (ref 3.5–5.0)
Alkaline Phosphatase: 334 U/L — ABNORMAL HIGH (ref 38–126)
Anion gap: 12 (ref 5–15)
BUN: 21 mg/dL — ABNORMAL HIGH (ref 6–20)
CO2: 22 mmol/L (ref 22–32)
Calcium: 8.9 mg/dL (ref 8.9–10.3)
Chloride: 94 mmol/L — ABNORMAL LOW (ref 98–111)
Creatinine, Ser: 0.56 mg/dL — ABNORMAL LOW (ref 0.61–1.24)
GFR, Estimated: >60 mL/min (ref >60)
Glucose, Bld: 126 mg/dL — ABNORMAL HIGH (ref 70–99)
Potassium: 4.3 mmol/L (ref 3.5–5.1)
Sodium: 128 mmol/L — ABNORMAL LOW (ref 135–145)
Total Bilirubin: 19.7 mg/dL (ref 0.3–1.2)
Total Protein: 5.3 g/dL — ABNORMAL LOW (ref 6.5–8.1)

## 2020-07-22 LAB — PROTIME-INR
INR: 1 (ref 0.8–1.2)
Prothrombin Time: 12.5 seconds (ref 11.4–15.2)

## 2020-07-22 LAB — SARS CORONAVIRUS 2 (TAT 6-24 HRS): SARS Coronavirus 2: NEGATIVE

## 2020-07-22 LAB — CBC
HCT: 34.3 % — ABNORMAL LOW (ref 39.0–52.0)
Hemoglobin: 12.1 g/dL — ABNORMAL LOW (ref 13.0–17.0)
MCH: 33.6 pg (ref 26.0–34.0)
MCHC: 35.3 g/dL (ref 30.0–36.0)
MCV: 95.3 fL (ref 80.0–100.0)
Platelets: 197 10*3/uL (ref 150–400)
RBC: 3.6 MIL/uL — ABNORMAL LOW (ref 4.22–5.81)
RDW: 20.8 % — ABNORMAL HIGH (ref 11.5–15.5)
WBC: 21.9 10*3/uL — ABNORMAL HIGH (ref 4.0–10.5)
nRBC: 0.5 % — ABNORMAL HIGH (ref 0.0–0.2)

## 2020-07-22 LAB — SODIUM, URINE, RANDOM: Sodium, Ur: 82 mmol/L

## 2020-07-22 LAB — OSMOLALITY, URINE: Osmolality, Ur: 766 mOsm/kg (ref 300–900)

## 2020-07-22 LAB — HEMOGLOBIN AND HEMATOCRIT, BLOOD
HCT: 27.8 % — ABNORMAL LOW (ref 39.0–52.0)
Hemoglobin: 9.7 g/dL — ABNORMAL LOW (ref 13.0–17.0)

## 2020-07-22 LAB — LIPASE, BLOOD: Lipase: 51 U/L (ref 11–51)

## 2020-07-22 LAB — POC OCCULT BLOOD, ED: Fecal Occult Bld: POSITIVE — AB

## 2020-07-22 MED ORDER — PANTOPRAZOLE SODIUM 40 MG IV SOLR
40.0000 mg | Freq: Two times a day (BID) | INTRAVENOUS | Status: DC
  Administered 2020-07-22 – 2020-07-23 (×3): 40 mg via INTRAVENOUS
  Filled 2020-07-22 (×4): qty 40

## 2020-07-22 MED ORDER — SODIUM CHLORIDE 0.9 % IV SOLN: INTRAVENOUS | Status: DC

## 2020-07-22 MED ORDER — LEVETIRACETAM IN NACL 1000 MG/100ML IV SOLN
1000.0000 mg | Freq: Two times a day (BID) | INTRAVENOUS | Status: DC
  Administered 2020-07-22 – 2020-07-23 (×3): 1000 mg via INTRAVENOUS
  Filled 2020-07-22 (×4): qty 100

## 2020-07-22 MED ORDER — IOHEXOL 300 MG/ML  SOLN
100.0000 mL | Freq: Once | INTRAMUSCULAR | Status: AC | PRN
  Administered 2020-07-22: 100 mL via INTRAVENOUS

## 2020-07-22 MED ORDER — ONDANSETRON HCL 4 MG PO TABS: 4.0000 mg | ORAL_TABLET | Freq: Four times a day (QID) | ORAL | Status: DC | PRN

## 2020-07-22 MED ORDER — ONDANSETRON HCL 4 MG/2ML IJ SOLN: 4.0000 mg | Freq: Four times a day (QID) | INTRAMUSCULAR | Status: DC | PRN

## 2020-07-22 MED ORDER — SODIUM CHLORIDE 0.9 % IV BOLUS
500.0000 mL | Freq: Once | INTRAVENOUS | Status: AC
  Administered 2020-07-22: 500 mL via INTRAVENOUS

## 2020-07-22 NOTE — ED Notes (Signed)
Report called and given to nurse.  

## 2020-07-22 NOTE — H&P (Signed)
History and Physical    John Watson QXI:[id number] DOB: 1961-05-14 DOA: 07/22/2020  PCP: Pcp, No  Patient coming from: Home  Chief Complaint: rectal bleeding and falls  HPI: John Watson is a 60 y.o. male with medical history significant of glioblastomia, chemo-induced hepatitis, seizures, chronic hiccups. Presenting rectal bleeding and falls. He reports that he went to the bathroom this morning and noticed blood in his stool and the toilet. It was BRBPR and painless. He notes as he tried to step away from the toilet, he fell onto his left side. There was no head injury or LOC. He states that his left side is chronically weak and he fell to that side. His brother helped him up and brought him to his bedroom. He fell one more time to the left side as he was trying to dress himself. Again, no head injury or LOC. He felt that it would be wise to come to the ED.   ED Course: He was found to have elevated LFTs (but overall showing an improving trend over his last several checks) and was FOBT positive. He was given some fluids, GI was consulted, TRH was called for admission.   Review of Systems:  Denies CP, palpitations, dyspnea, N/V/D, fevers, lightheadness, dizziness. Review of systems is otherwise negative for all not mentioned in HPI.   PMHx Past Medical History:  Diagnosis Date  . Glioblastoma ([place]) 02/13/2020  . Pneumonia 2012  . Seizure ([place]) 8/16/211   last one 02/02/2020  . Stroke [facility])    Left leg weakness    PSHx Past Surgical History:  Procedure Laterality Date  . APPLICATION OF CRANIAL NAVIGATION Right 02/13/2020   Procedure: APPLICATION OF CRANIAL NAVIGATION;  Surgeon: [name], [name], DO;  Location: [place];  Service: Neurosurgery;  Laterality: Right;  APPLICATION OF CRANIAL NAVIGATION  . BUBBLE STUDY  01/17/2020   Procedure: BUBBLE STUDY;  Surgeon: [name], MD;  Location: [place];  Service: Cardiovascular;;  . FRAMELESS  BIOPSY WITH BRAINLAB Right  02/13/2020   Procedure: STEREOTACTIC BRAIN BIOPSY OF RIGHT SIDED LESION;  Surgeon: [name], DO;  Location: [place];  Service: Neurosurgery;  Laterality: Right;  STEREOTACTIC BRAIN BIOPSY OF RIGHT SIDED LESION  . LOOP RECORDER INSERTION N/A 01/17/2020   Procedure: LOOP RECORDER INSERTION;  Surgeon: [name], MD;  Location: [place] CV LAB;  Service: Cardiovascular;  Laterality: N/A;  . TEE WITHOUT CARDIOVERSION N/A 01/17/2020   Procedure: TRANSESOPHAGEAL ECHOCARDIOGRAM (TEE);  Surgeon: [name], MD;  Location[facility] ENDOSCOPY;  Service: Cardiovascular;  Laterality: N/A;    SocHx  reports that he quit smoking about 20 years ago. His smoking use included cigars. He quit after 2.00 years of use. He has never used smokeless tobacco. He reports previous alcohol use. He reports that he does not use drugs.  Allergies  Allergen Reactions  . Vicodin [Hydrocodone-Acetaminophen] Anaphylaxis    FamHx Family History  Problem Relation Age of Onset  . Hypertension Mother   . Diabetes Mother   . Lung cancer Father   . Hypertension Sister   . Prostate cancer Brother   . Prostate cancer Paternal Grandfather   . Hypertension Brother   . Hypertension Brother   . Hyperlipidemia Brother     Prior to Admission medications   Medication Sig Start Date End Date Taking? Authorizing Provider  chlorproMAZINE (THORAZINE) 25 MG tablet Take 1 tablet (25 mg total) by mouth 3 (three) times daily as needed for hiccoughs. 06/06/20  Ventura Sellers, MD  dexamethasone (DECADRON) 4 MG tablet Take 2 tablets (8 mg total) by mouth 2 (two) times daily. 07/08/20   Ventura Sellers, MD  gabapentin (NEURONTIN) 100 MG capsule Take 1 capsule (100 mg total) by mouth 3 (three) times daily. 07/14/20   Pokhrel, Corrie Mckusick, MD  levETIRAcetam (KEPPRA) 1000 MG tablet Take 1 tablet (1,000 mg total) by mouth 2 (two) times daily. 03/27/20 04/26/20  Ventura Sellers, MD  ondansetron (ZOFRAN) 8 MG tablet Take 1 tablet  (8 mg total) by mouth 2 (two) times daily as needed (nausea and vomiting). May take 30-60 minutes prior to Temodar administration if nausea/vomiting occurs. 03/07/20   Vaslow, Acey Lav, MD  pantoprazole (PROTONIX) 40 MG tablet Take 1 tablet (40 mg total) by mouth 2 (two) times daily. Take 30-60 minutes before breakfast and dinner 06/11/20   Levin Erp, [place]    Physical Exam: Vitals:   07/22/20 1230 07/22/20 1245 07/22/20 1330 07/22/20 1400  BP: 130/83  128/89 123/87  Pulse: (!) 48 (!) 51 (!) 57 (!) 53  Resp: 16 12 15 13   Temp:      TempSrc:      SpO2: 98% 99% 99% 99%  Weight:      Height:        General: 60 y.o. male resting in bed in NAD Eyes: PERRL, normal sclera ENMT: Nares patent w/o discharge, orophaynx clear, dentition normal, ears w/o discharge/lesions/ulcers Neck: Supple, trachea midline Cardiovascular: brady, +S1, S2, no m/g/r, equal pulses throughout Respiratory: CTABL, no w/r/r, normal WOB GI: BS+, mild distention, NT, soft, no masses noted, no organomegaly noted; chronic hiccup MSK: No e/c/c Skin: No rashes, bruises, ulcerations noted; chronic changes/scaling of BLE Neuro: A&O x 3, no focal deficits Psyc: Flat affect, calm/cooperative  Labs on Admission: I have personally reviewed following labs and imaging studies  CBC: Recent Labs  Lab 07/22/20 0905  WBC 21.9*  HGB 12.1*  HCT 34.3*  MCV 95.3  PLT 073   Basic Metabolic Panel: Recent Labs  Lab 07/22/20 0905  NA 128*  K 4.3  CL 94*  CO2 22  GLUCOSE 126*  BUN 21*  CREATININE 0.56*  CALCIUM 8.9   GFR: Estimated Creatinine Clearance: 105.2 mL/min (A) (by C-G formula based on SCr of 0.56 mg/dL (L)). Liver Function Tests: Recent Labs  Lab 07/22/20 0905  AST 112*  ALT 441*  ALKPHOS 334*  BILITOT 19.7*  PROT 5.3*  ALBUMIN 2.5*   Recent Labs  Lab 07/22/20 0905  LIPASE 51   No results for input(s): AMMONIA in the last 168 hours. Coagulation Profile: Recent Labs  Lab 07/22/20 0954   INR 1.0   Cardiac Enzymes: No results for input(s): CKTOTAL, CKMB, CKMBINDEX, TROPONINI in the last 168 hours. BNP (last 3 results) No results for input(s): PROBNP in the last 8760 hours. HbA1C: No results for input(s): HGBA1C in the last 72 hours. CBG: No results for input(s): GLUCAP in the last 168 hours. Lipid Profile: No results for input(s): CHOL, HDL, LDLCALC, TRIG, CHOLHDL, LDLDIRECT in the last 72 hours. Thyroid Function Tests: No results for input(s): TSH, T4TOTAL, FREET4, T3FREE, THYROIDAB in the last 72 hours. Anemia Panel: No results for input(s): VITAMINB12, FOLATE, FERRITIN, TIBC, IRON, RETICCTPCT in the last 72 hours. Urine analysis:    Component Value Date/Time   COLORURINE AMBER (A) 07/22/2020 1214   APPEARANCEUR CLEAR 07/22/2020 1214   LABSPEC 1.043 (H) 07/22/2020 1214   PHURINE 6.0 07/22/2020 1214   GLUCOSEU NEGATIVE 07/22/2020  [place] 07/22/2020 [place] (A) 07/22/2020 [place] 07/22/2020 1214   PROTEINUR 30 (A) 07/22/2020 1214   NITRITE NEGATIVE 07/22/2020 1214   LEUKOCYTESUR NEGATIVE 07/22/2020 1214    Radiological Exams on Admission: CT Abdomen Pelvis W Contrast  Result Date: 07/22/2020 CLINICAL DATA:  Bright red blood per rectum, falls EXAM: CT ABDOMEN AND PELVIS WITH CONTRAST TECHNIQUE: Multidetector CT imaging of the abdomen and pelvis was performed using the standard protocol following bolus administration of intravenous contrast. CONTRAST:  156mL OMNIPAQUE IOHEXOL 300 MG/ML  SOLN COMPARISON:  07/14/2020 FINDINGS: Lower chest: Lingula subsegmental atelectasis. Hepatobiliary: Scattered small low-density hepatic lesions characterized as cysts previously. Contracted gallbladder. No biliary dilatation. Pancreas: Unremarkable. Spleen: Unremarkable. Adrenals/Urinary Tract: Adrenals, kidneys, and bladder are unremarkable. Stomach/Bowel: Stomach is within normal limits. Bowel is normal in caliber is. Bowel is  normal in caliber. Vascular/Lymphatic: Mild aortic atherosclerosis. No enlarged lymph nodes. Reproductive: Enlarged prostate as before. Other: No ascites.  No new abnormality of the abdominal wall. Musculoskeletal: No acute or significant osseous abnormality. IMPRESSION: No acute abnormality.  No findings to account for reported symptoms. Electronically Signed   By: Macy Mis M.D.   On: 07/22/2020 11:34   Assessment/Plan Rectal Bleeding     - place in obs, med-surg     - Hgb is at baseline; check q8h&h     - transfuse for Hgb < 7     - follow up GI recs  Fall     - reports chronic left side weakness; this is the side that caused him to fall; no head injury or LOC     - PT/OT, follow  Glioblastoma Seizure     - Follows with [name]     - chemo has been held for the last month d/t hepatitis     - palliative care consult     - continue keppra, decadron   Chemo-induced hepatitis Elevated LFTs     - followed by Dr. Rush Landmark     - overall trend is improving      - no evidence of obstruction on CT     - awating GI recs  Hyponatremia     - element of SIADH? Check urine studies     - will give limited amount of fluids now  Chronic hiccup GERD     - thorazine, protonix  Leukocytosis     - UA negative     - no fevers, no respiratory symptoms     - he has been on chronic steroids     - hold on abx for now without clear indication of infection  DVT prophylaxis: SCDs  Code Status: DNR  Family Communication: None at bedside  Consults called: EDP spoke with GI  Status is: Observation  The patient remains OBS appropriate and will d/c before 2 midnights.  Dispo: The patient is from: Home              Anticipated d/c is to: Home              Anticipated d/c date is: 1 day              Patient currently is not medically stable to d/c.   Difficult to place patient No  Jonnie Finner DO [facility]  If 7PM-7AM, please contact  night-coverage [email or web address]  07/22/2020, 2:32 PM

## 2020-07-22 NOTE — ED Provider Notes (Addendum)
[facility] DEPT Provider Note   CSN: [id number] Arrival date & time: 07/22/20  2376     History Chief Complaint  Patient presents with  . Abdominal Pain    John Watson is a 60 y.o. male.  HPI Patient presents for evaluation of blood on toilet tissue when he wiped this morning. He is also fallen today because his left leg is weak.  Does not feel he injured himself in the fall.  He denies hemorrhoid, diarrhea, constipation.  He has been eating well.  There is been no nausea or vomiting.  He denies fever.  He was recently seen in the ED, and admitted overnight for observation to evaluate periods of choking and hiccups. Initially there had been concern about aspiration however that did not prove to be a diagnostic concern, after the observation. Patient was diagnosed with cholestatic hepatitis, recently. He has a glioblastoma, leading to leg weakness, not currently receiving chemotherapy. He has been on steroid therapy leading to elevated white blood cell count.  He continues to take Decadron 4 mg, twice a day.  He had a brain MRI on 07/11/2020 and was to follow-up in the oncology clinic on 07/14/2020, which was the day he was discharged from observation. The MRI did not show progression of the right frontal parietal mass.    Past Medical History:  Diagnosis Date  . Glioblastoma ([place]) 02/13/2020  . Pneumonia 2012  . Seizure ([place]) 8/16/211   last one 02/02/2020  . Stroke [facility])    Left leg weakness    Patient Active Problem List   Diagnosis Date Noted  . Choking sensation 07/14/2020  . Intractable hiccups 07/14/2020  . GERD without esophagitis 07/14/2020  . 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 Jaundice 05/16/2020  . Brain lesion 02/13/2020  . Glioblastoma of frontal lobe ([place]) 02/01/2020  . Seizure disorder ([place]) 01/16/2020  . Hyperlipidemia LDL goal <70 01/16/2020  . Tobacco use disorder 01/16/2020  . 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 APPLICATION OF CRANIAL NAVIGATION Right 02/13/2020   Procedure: APPLICATION OF CRANIAL NAVIGATION;  Surgeon: [name], [name], DO;  Location: [facility];  Service: Neurosurgery;  Laterality: Right;  APPLICATION OF CRANIAL NAVIGATION  . BUBBLE STUDY  01/17/2020   Procedure: BUBBLE STUDY;  Surgeon: [name], MD;  Location: [place];  Service: Cardiovascular;;  . FRAMELESS  BIOPSY WITH BRAINLAB Right 02/13/2020   Procedure: STEREOTACTIC BRAIN BIOPSY OF RIGHT SIDED LESION;  Surgeon: [name], DO;  Location: [place];  Service: Neurosurgery;  Laterality: Right;  STEREOTACTIC BRAIN BIOPSY OF RIGHT SIDED LESION  . LOOP RECORDER INSERTION N/A 01/17/2020   Procedure: LOOP RECORDER INSERTION;  Surgeon: [name], MD;  Location: [facility] CV LAB;  Service: Cardiovascular;  Laterality: N/A;  . TEE WITHOUT CARDIOVERSION N/A 01/17/2020   Procedure: TRANSESOPHAGEAL ECHOCARDIOGRAM (TEE);  Surgeon: [name], MD;  Location: [facility] ENDOSCOPY;  Service: Cardiovascular;  Laterality: N/A;       Family History  Problem Relation Age of Onset  . Hypertension Mother   . Diabetes Mother   . Lung cancer Father   . Hypertension Sister   . Prostate cancer Brother   . Prostate cancer Paternal Grandfather   . Hypertension Brother   . Hypertension Brother   . Hyperlipidemia Brother     Social History   Tobacco Use  . Smoking status:  Former Smoker    Years: 2.00    Types: Cigars    Quit date: 2002    Years since quitting: 20.1  . Smokeless tobacco: Never Used  Vaping Use  . Vaping Use: Never used  Substance Use Topics  . Alcohol use: Not Currently  . Drug use: No    Home Medications Prior to Admission medications   Medication Sig Start Date End Date Taking? Authorizing Provider  chlorproMAZINE (THORAZINE) 25 MG tablet Take 1 tablet (25 mg total) by mouth 3  (three) times daily as needed for hiccoughs. 06/06/20   Vaslow, Acey Lav, MD  dexamethasone (DECADRON) 4 MG tablet Take 2 tablets (8 mg total) by mouth 2 (two) times daily. 07/08/20   Ventura Sellers, MD  gabapentin (NEURONTIN) 100 MG capsule Take 1 capsule (100 mg total) by mouth 3 (three) times daily. 07/14/20   Pokhrel, Corrie Mckusick, MD  levETIRAcetam (KEPPRA) 1000 MG tablet Take 1 tablet (1,000 mg total) by mouth 2 (two) times daily. 03/27/20 04/26/20  Ventura Sellers, MD  ondansetron (ZOFRAN) 8 MG tablet Take 1 tablet (8 mg total) by mouth 2 (two) times daily as needed (nausea and vomiting). May take 30-60 minutes prior to Temodar administration if nausea/vomiting occurs. 03/07/20   Vaslow, Acey Lav, MD  pantoprazole (PROTONIX) 40 MG tablet Take 1 tablet (40 mg total) by mouth 2 (two) times daily. Take 30-60 minutes before breakfast and dinner 06/11/20   Levin Erp, PA    Allergies    Vicodin [hydrocodone-acetaminophen]  Review of Systems   Review of Systems  All other systems reviewed and are negative.   Physical Exam Updated Vital Signs BP 123/87   Pulse (!) 53   Temp 98.3 F (36.8 C) (Oral)   Resp 13   Ht 5\' 11"  (1.803 m)   Wt 74.8 kg   SpO2 99%   BMI 23.01 kg/m   Physical Exam Vitals and nursing note reviewed.  Constitutional:      General: He is not in acute distress.    Appearance: He is well-developed and well-nourished. He is not ill-appearing, toxic-appearing or diaphoretic.  HENT:     Head: Normocephalic and atraumatic.     Right Ear: External ear normal.     Left Ear: External ear normal.     Mouth/Throat:     Mouth: Mucous membranes are moist.     Pharynx: No oropharyngeal exudate or posterior oropharyngeal erythema.  Eyes:     Extraocular Movements: EOM normal.     Conjunctiva/sclera: Conjunctivae normal.     Pupils: Pupils are equal, round, and reactive to light.  Neck:     Trachea: Phonation normal.  Cardiovascular:     Rate and Rhythm: Normal  rate and regular rhythm.     Heart sounds: Normal heart sounds.  Pulmonary:     Effort: Pulmonary effort is normal.     Breath sounds: Normal breath sounds.  Chest:     Chest wall: No bony tenderness.  Abdominal:     General: There is distension.     Palpations: Abdomen is soft.     Tenderness: There is abdominal tenderness (Left upper and lower, mild).  Genitourinary:    Comments: Normal anus.  Small amount of thin brown stool in rectal vault.  No visible or palpable hemorrhoids Musculoskeletal:        General: No swelling or tenderness.     Cervical back: Normal range of motion and neck supple.     Comments: Weakness  left arm and leg, ongoing.  Skin:    General: Skin is warm, dry and intact.  Neurological:     Mental Status: He is alert and oriented to person, place, and time.     Cranial Nerves: No cranial nerve deficit.     Motor: No abnormal muscle tone.     Coordination: Coordination normal.  Psychiatric:        Mood and Affect: Mood and affect and mood normal.        Behavior: Behavior normal.        Thought Content: Thought content normal.        Judgment: Judgment normal.     ED Results / Procedures / Treatments   Labs (all labs ordered are listed, but only abnormal results are displayed) Labs Reviewed  COMPREHENSIVE METABOLIC PANEL - Abnormal; Notable for the following components:      Result Value   Sodium 128 (*)    Chloride 94 (*)    Glucose, Bld 126 (*)    BUN 21 (*)    Creatinine, Ser 0.56 (*)    Total Protein 5.3 (*)    Albumin 2.5 (*)    AST 112 (*)    ALT 441 (*)    Alkaline Phosphatase 334 (*)    Total Bilirubin 19.7 (*)    All other components within normal limits  CBC - Abnormal; Notable for the following components:   WBC 21.9 (*)    RBC 3.60 (*)    Hemoglobin 12.1 (*)    HCT 34.3 (*)    RDW 20.8 (*)    nRBC 0.5 (*)    All other components within normal limits  URINALYSIS, ROUTINE W REFLEX MICROSCOPIC - Abnormal; Notable for the  following components:   Color, Urine AMBER (*)    Specific Gravity, Urine 1.043 (*)    Bilirubin Urine MODERATE (*)    Protein, ur 30 (*)    All other components within normal limits  POC OCCULT BLOOD, ED - Abnormal; Notable for the following components:   Fecal Occult Bld POSITIVE (*)    All other components within normal limits  SARS CORONAVIRUS 2 (TAT 6-24 HRS)  LIPASE, BLOOD  PROTIME-INR  OCCULT BLOOD X 1 CARD TO LAB, STOOL    EKG None  Radiology CT Abdomen Pelvis W Contrast  Result Date: 07/22/2020 CLINICAL DATA:  Bright red blood per rectum, falls EXAM: CT ABDOMEN AND PELVIS WITH CONTRAST TECHNIQUE: Multidetector CT imaging of the abdomen and pelvis was performed using the standard protocol following bolus administration of intravenous contrast. CONTRAST:  154mL OMNIPAQUE IOHEXOL 300 MG/ML  SOLN COMPARISON:  07/14/2020 FINDINGS: Lower chest: Lingula subsegmental atelectasis. Hepatobiliary: Scattered small low-density hepatic lesions characterized as cysts previously. Contracted gallbladder. No biliary dilatation. Pancreas: Unremarkable. Spleen: Unremarkable. Adrenals/Urinary Tract: Adrenals, kidneys, and bladder are unremarkable. Stomach/Bowel: Stomach is within normal limits. Bowel is normal in caliber is. Bowel is normal in caliber. Vascular/Lymphatic: Mild aortic atherosclerosis. No enlarged lymph nodes. Reproductive: Enlarged prostate as before. Other: No ascites.  No new abnormality of the abdominal wall. Musculoskeletal: No acute or significant osseous abnormality. IMPRESSION: No acute abnormality.  No findings to account for reported symptoms. Electronically Signed   By: Macy Mis M.D.   On: 07/22/2020 11:34    Procedures .Critical Care Performed by: Daleen Bo, MD Authorized by: Daleen Bo, MD   Critical care provider statement:    Critical care time (minutes):  45   Critical care start time:  07/22/2020 8:50 AM  Critical care end time:  07/22/2020 2:22 PM    Critical care time was exclusive of:  Separately billable procedures and treating other patients   Critical care was time spent personally by me on the following activities:  Blood draw for specimens, development of treatment plan with patient or surrogate, discussions with consultants, evaluation of patient's response to treatment, examination of patient, obtaining history from patient or surrogate, ordering and performing treatments and interventions, ordering and review of laboratory studies, pulse oximetry, re-evaluation of patient's condition, review of old charts and ordering and review of radiographic studies     Medications Ordered in ED Medications  0.9 %  sodium chloride infusion ( Intravenous New Bag/Given 07/22/20 1411)  iohexol (OMNIPAQUE) 300 MG/ML solution 100 mL (100 mLs Intravenous Contrast Given 07/22/20 1057)  sodium chloride 0.9 % bolus 500 mL (500 mLs Intravenous New Bag/Given 07/22/20 1411)    ED Course  I have reviewed the triage vital signs and the nursing notes.  Pertinent labs & imaging results that were available during my care of the patient were reviewed by me and considered in my medical decision making (see chart for details).    MDM Rules/Calculators/A&P                           Patient Vitals for the past 24 hrs:  BP Temp Temp src Pulse Resp SpO2 Height Weight  07/22/20 1400 123/87 -- -- (!) 53 13 99 % -- --  07/22/20 1330 128/89 -- -- (!) 57 15 99 % -- --  07/22/20 1245 -- -- -- (!) 51 12 99 % -- --  07/22/20 1230 130/83 -- -- (!) 48 16 98 % -- --  07/22/20 1200 126/82 -- -- (!) 52 11 98 % -- --  07/22/20 1130 126/84 -- -- (!) 55 13 99 % -- --  07/22/20 1030 124/79 -- -- (!) 50 15 97 % -- --  07/22/20 0846 -- -- -- -- -- -- 5\' 11"  (1.803 m) 74.8 kg  07/22/20 0843 132/89 98.3 F (36.8 C) Oral 84 19 95 % -- --    9:53 AM Reevaluation with update and discussion. After initial assessment and treatment, an updated evaluation reveals patient remains  alert, and now reports that he vomited blood several days ago.  He had not previously told me that.  When we discussed possible discharge home, he became tearful and stated he did not feel like he could do it.  He states he currently lives with his brother.  Patient seems distraught at this time. Daleen Bo   Medical Decision Making:  This patient is presenting for evaluation of rectal bleeding with abdominal tenderness, which does require a range of treatment options, and is a complaint that involves a moderate risk of morbidity and mortality. The differential diagnoses include colitis, bleeding secondary to liver disease. I decided to review old records, and in summary middle-aged male presenting with new problem of rectal bleeding with ongoing left-sided weakness due to glioblastoma.  I did not require additional historical information from Anyone.  Clinical Laboratory Tests Ordered, included CBC, Metabolic panel and Fecal occult blood. Review indicates generally reassuring results.  Significant abnormalities include elevated specific gravity, occult blood in stool, sodium and chloride low, total bilirubin high but improving, transaminases high, white count high. Radiologic Tests Ordered, included CT abdomen pelvis.  I independently Visualized: Radiographic images, which show no acute abnormalities per radiologist report    Critical Interventions-clinical  evaluation, laboratory testing, CT imaging, observation reassessment  After These Interventions, the Patient was reevaluated and was found to be moderately disabled, and apparently worsening despite current treatments.  His treatment for glioblastoma is on hold pending outcome of his acute hepatitis.  It appears that the hepatitis situation is in a holding pattern, monitoring and considering ongoing treatment periodically.  It is not clear that the patient will recover from his current problems, and clearly he is not doing well at this time.   He has had multiple encounters recently and is not thriving at home.  He does appear dehydrated today, with elevated specific gravity and climbing renal function parameters.  Will consult hospitalist for hospitalization, an observation basis and consider palliative consultation, and monitoring for progress following treatment with IV fluids and serial hemoglobin testing.  CRITICAL CARE-yes Performed by: Daleen Bo  Nursing Notes Reviewed/ Care Coordinated Applicable Imaging Reviewed Interpretation of Laboratory Data incorporated into ED treatment  2:01 PM-Consult complete with hospitalist. Patient case explained and discussed.  He agrees to admit patient for further evaluation and treatment. Call ended at 2:20 PM    Final Clinical Impression(s) / ED Diagnoses Final diagnoses:  Glioblastoma ([place])  Hepatitis  Dehydration  Rectal bleeding    Rx / DC Orders ED Discharge Orders    None       Daleen Bo, MD 07/22/20 1420    Daleen Bo, MD 07/22/20 1422   I discussed the case with Nicoletta Ba, PA covering for Dr. Rush Landmark at this time.  Their service will evaluate the patient, and give recommendations.    Daleen Bo, MD 07/22/20 [phone]

## 2020-07-22 NOTE — ED Triage Notes (Addendum)
Pt reports he went to bathroom this morning, abd pain, had bright red blood on tissue when he wiped with clot in the middle. Reports falling twice this morning, states "left leg is pretty much dead"

## 2020-07-22 NOTE — ED Notes (Signed)
Assisted patient to side of bed for UA. Patient 1 assist.

## 2020-07-22 NOTE — Consult Note (Addendum)
Consultation  Referring Provider: Karenann Cai MD / Eulis Foster Primary Care Physician:  Pcp, No Primary Gastroenterologist:  Dr.Mansouraty  Reason for Consultation:   Drug induced hepatitis  HPI: John Watson is a 60 y.o. male , recently established with GI/Dr. Rush Landmark when he was seen in the office by Ellouise Newer, PA-C on 06/11/2020 with an acute hepatitis picture.. Patient had been seen while he was in the hospital 05/17/2020 by Dr. Meriel Pica with significantly elevated LFTs and T bili of 23. 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 CT of the abdomen pelvis was done in December 2021 with finding of gallstones, no ductal dilation, and noted scattered subcentimeter hepatic hypodensities largest measuring 10 mm, felt to be consistent with cysts and stable from 4 months previous. 1 06/05/2018 2T bili was 29.3/alk phos 568/AST 291/ALT 980. Acute hepatitis serologies were negative.  Initial serologic work-up was done thereafter with CMV EBV and herpes serologies negative. Markers for autoimmune liver disease and inheritable liver disease unrevealing.  On 07/14/2018 2T bili 20.7 alk phos 384, AST 146, ALT 353. He ultimately underwent liver biopsy which showed moderate to severe cholestasis and minimal hepatitis.  Findings felt consistent with drug-induced hepatitis.  MRI of the head was done on 07/11/2020 which showed the right medial necrotic mass measuring 3.6 x 3.5 x 2.8 cm some possible increase in surrounding edema but overall stable. LFTs today -T bili 19.7/alk phos 334/AST 112/ALT 441, INR 1.0 WBC 21.9, hemoglobin 12.1, platelets 197, stool for occult blood positive Of COVID-19 pending  Patient has been on a course of Decadron 8 mg twice daily, due to an acute on chronic decline in motor function in the left upper and left  lower extremity.  This is being directed by oncology/[name]  He presented to the emergency room, with complaints of weakness, he had fallen today.  Had noticed some blood on the tissue with wiping this morning.  He is being admitted, with failure to thrive and weakness.  Patient says that he saw blood on the tissue this morning, and also noted blood in the commode with his bowel movement.  He is unable to say whether that was mixed in with the bowel movement.  He feels that some of the stool did look dark.  He has not noted any melena or hematochezia prior to today. He also relates that he has been vomiting on a daily basis and did bring up some streaks of red blood and dark blood over the weekend.  He has had ongoing problems with indigestion belching and burping for many weeks.  He is having a difficult time eating and drinking as liquids and solids frequently come back up.  Pills will also be regurgitated.  He is having ongoing belching and continues to have hiccups.  He was seen in the ER on 07/14/2020 and had been started on Thorazine for the hiccups which has not made any difference.  Weight is down about 20 pounds over the past 4 months. Patient has not had prior endoscopic evaluation.      Past Medical History:  Diagnosis Date  . Glioblastoma ([place]) 02/13/2020  . Pneumonia 2012  . Seizure ([place]) 8/16/211   last one 02/02/2020  . Stroke [facility]    Left leg weakness    Past Surgical History:  Procedure Laterality Date  .  APPLICATION OF CRANIAL NAVIGATION Right 02/13/2020   Procedure: APPLICATION OF CRANIAL NAVIGATION;  Surgeon: [name], [name], DO;  Location: [place];  Service: Neurosurgery;  Laterality: Right;  APPLICATION OF CRANIAL NAVIGATION  . BUBBLE STUDY  01/17/2020   Procedure: BUBBLE STUDY;  Surgeon: [name], MD;  Location: [place];  Service: Cardiovascular;;  . FRAMELESS  BIOPSY WITH BRAINLAB Right 02/13/2020   Procedure: STEREOTACTIC BRAIN BIOPSY OF RIGHT  SIDED LESION;  Surgeon: [name], DO;  Location: [place];  Service: Neurosurgery;  Laterality: Right;  STEREOTACTIC BRAIN BIOPSY OF RIGHT SIDED LESION  . LOOP RECORDER INSERTION N/A 01/17/2020   Procedure: LOOP RECORDER INSERTION;  Surgeon: [name], MD;  Location: [place] CV LAB;  Service: Cardiovascular;  Laterality: N/A;  . TEE WITHOUT CARDIOVERSION N/A 01/17/2020   Procedure: TRANSESOPHAGEAL ECHOCARDIOGRAM (TEE);  Surgeon: [name], MD;  Location: [facility] ENDOSCOPY;  Service: Cardiovascular;  Laterality: N/A;    Prior to Admission medications   Medication Sig Start Date End Date Taking? Authorizing Provider  chlorproMAZINE (THORAZINE) 25 MG tablet Take 1 tablet (25 mg total) by mouth 3 (three) times daily as needed for hiccoughs. 06/06/20   Vaslow, Acey Lav, MD  dexamethasone (DECADRON) 4 MG tablet Take 2 tablets (8 mg total) by mouth 2 (two) times daily. 07/08/20   Ventura Sellers, MD  gabapentin (NEURONTIN) 100 MG capsule Take 1 capsule (100 mg total) by mouth 3 (three) times daily. 07/14/20   Pokhrel, Corrie Mckusick, MD  levETIRAcetam (KEPPRA) 1000 MG tablet Take 1 tablet (1,000 mg total) by mouth 2 (two) times daily. 03/27/20 04/26/20  Ventura Sellers, MD  ondansetron (ZOFRAN) 8 MG tablet Take 1 tablet (8 mg total) by mouth 2 (two) times daily as needed (nausea and vomiting). May take 30-60 minutes prior to Temodar administration if nausea/vomiting occurs. 03/07/20   Vaslow, Acey Lav, MD  pantoprazole (PROTONIX) 40 MG tablet Take 1 tablet (40 mg total) by mouth 2 (two) times daily. Take 30-60 minutes before breakfast and dinner 06/11/20   Levin Erp, PA    Current Facility-Administered Medications  Medication Dose Route Frequency Provider Last Rate Last Admin  . 0.9 %  sodium chloride infusion   Intravenous Continuous Daleen Bo, MD 125 mL/hr at 07/22/20 1411 New Bag at 07/22/20 1411   Current Outpatient Medications  Medication Sig Dispense Refill  .  chlorproMAZINE (THORAZINE) 25 MG tablet Take 1 tablet (25 mg total) by mouth 3 (three) times daily as needed for hiccoughs. 60 tablet 1  . dexamethasone (DECADRON) 4 MG tablet Take 2 tablets (8 mg total) by mouth 2 (two) times daily. 90 tablet 1  . gabapentin (NEURONTIN) 100 MG capsule Take 1 capsule (100 mg total) by mouth 3 (three) times daily. 90 capsule 0  . levETIRAcetam (KEPPRA) 1000 MG tablet Take 1 tablet (1,000 mg total) by mouth 2 (two) times daily. 60 tablet 3  . ondansetron (ZOFRAN) 8 MG tablet Take 1 tablet (8 mg total) by mouth 2 (two) times daily as needed (nausea and vomiting). May take 30-60 minutes prior to Temodar administration if nausea/vomiting occurs. 30 tablet 1  . pantoprazole (PROTONIX) 40 MG tablet Take 1 tablet (40 mg total) by mouth 2 (two) times daily. Take 30-60 minutes before breakfast and dinner 60 tablet 5    Allergies as of 07/22/2020 - Review Complete 07/22/2020  Allergen Reaction Noted  . Vicodin [hydrocodone-acetaminophen] Anaphylaxis 03/06/2020    Family History  Problem Relation Age of Onset  .  Hypertension Mother   . Diabetes Mother   . Lung cancer Father   . Hypertension Sister   . Prostate cancer Brother   . Prostate cancer Paternal Grandfather   . Hypertension Brother   . Hypertension Brother   . Hyperlipidemia Brother     Social History   Socioeconomic History  . Marital status: Single    Spouse name: Not on file  . Number of children: 1  . Years of education: Not on file  . Highest education level: Not on file  Occupational History  . Not on file  Tobacco Use  . Smoking status: Former Smoker    Years: 2.00    Types: Cigars    Quit date: 2002    Years since quitting: 20.1  . Smokeless tobacco: Never Used  Vaping Use  . Vaping Use: Never used  Substance and Sexual Activity  . Alcohol use: Not Currently  . Drug use: No  . Sexual activity: Yes    Birth control/protection: None  Other Topics Concern  . Not on file  Social  History Narrative  . Not on file   Social Determinants of Health   Financial Resource Strain: Not on file  Food Insecurity: Not on file  Transportation Needs: Not on file  Physical Activity: Not on file  Stress: Not on file  Social Connections: Not on file  Intimate Partner Violence: Not on file    Review of Systems: Pertinent positive and negative review of systems were noted in the above HPI section.  All other review of systems was otherwise negative.  Physical Exam: Vital signs in last 24 hours: Temp:  [98.3 F (36.8 C)] 98.3 F (36.8 C) (02/22 0843) Pulse Rate:  [48-84] 61 (02/22 1430) Resp:  [11-19] 17 (02/22 1430) BP: (123-132)/(79-89) 127/83 (02/22 1430) SpO2:  [95 %-99 %] 98 % (02/22 1430) Weight:  [74.8 kg] 74.8 kg (02/22 0846)   General:   Alert,  Well-developed, well-nourished, pleasant and cooperative in NAD Head:  Normocephalic and atraumatic. Eyes:  Sclera clear, no icterus.   Conjunctiva pink. Ears:  Normal auditory acuity. Nose:  No deformity, discharge,  or lesions. Mouth:  No deformity or lesions.  No oral thrush obvious Neck:  Supple; no masses or thyromegaly. Lungs:  Clear throughout to auscultation.   No wheezes, crackles, or rhonchi. Heart:  Regular rate and rhythm; no murmurs, clicks, rubs,  or gallops. Abdomen:  Soft,nontender, BS active,nonpalp mass or hsm.   Rectal:  Deferred  Msk:  Symmetrical without gross deformities. . Pulses:  Normal pulses noted. Extremities:  Without clubbing or edema. Neurologic:  Alert and  oriented x4;  grossly normal neurologically. Skin:  Intact without significant lesions or rashes.. Psych:  Alert and cooperative. Normal mood and affect.  Intake/Output from previous day: No intake/output data recorded. Intake/Output this shift: No intake/output data recorded.  Lab Results: Recent Labs    07/22/20 0905  WBC 21.9*  HGB 12.1*  HCT 34.3*  PLT 197   BMET Recent Labs    07/22/20 0905  NA 128*  K 4.3   CL 94*  CO2 22  GLUCOSE 126*  BUN 21*  CREATININE 0.56*  CALCIUM 8.9   LFT Recent Labs    07/22/20 0905  PROT 5.3*  ALBUMIN 2.5*  AST 112*  ALT 441*  ALKPHOS 334*  BILITOT 19.7*   PT/INR Recent Labs    07/22/20 0954  LABPROT 12.5  INR 1.0   Hepatitis Panel No results for input(s): HEPBSAG, HCVAB,  HEPAIGM, HEPBIGM in the last 72 hours.   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  Patient had presented with CVA August 2021. He is status post radiation and a course of chemotherapy with temozolomide  Patient has had some progression of left upper and left lower extremity weakness for which he is now on high-dose steroids without any improvement in symptoms. 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 He has had some improvement in parameters with slow decline in bilirubin and transaminases, and INR remains normal.  No overt liver failure.  #3 seizure disorder #4 persistent hiccups/belching, indigestion, vomiting/regurgitation, somewhat progressive over the past 3 to 4 months.  He is also had some intermittent low volume hematemesis Patient says the hiccups symptoms started on the same day he underwent biopsy for the glioblastoma-question neurogenic etiology.  I suspect he likely has a severe esophagitis at present  #5 small-volume hematochezia-no evidence thus far of any acute significant GI bleed.  Hematochezia may have been secondary to hemorrhoids.  Plan;; Continue supportive management for the severe cholestatic hepatitis.  There is no medical therapy for this, hopefully will continue to show some slow resolution  Start IV PPI twice daily Serial hemoglobins, and transfuse for hemoglobin less than 7.5 IV Zofran around-the-clock We will plan for EGD with Dr. Henrene Pastor tomorrow afternoon at 1 PM.  Procedure was discussed in detail  with patient including indications risks and benefits and he is agreeable to proceed.  Thank you will follow with you  Amy Esterwood PA-C 07/22/2020, 2:53 PM  GI ATTENDING  History, laboratories, x-rays reviewed.  Patient seen and examined.  Agree with comprehensive consultation note as outlined above.  He was seen this patient previously for drug-induced cholestasis.  Persistent but seemingly slowly improving.  No evidence for hepatic synthetic dysfunction at present.  I suspect that his upper GI symptoms are neurologic in origin.  May have secondary esophagitis.  We will proceed with upper endoscopy.  Patient is high risk.  Agree with PPI therapy, blood count monitoring, transfuse as needed, and running Zofran.  Docia Chuck. Geri Seminole., M.D. [facility] Division of Gastroenterology

## 2020-07-23 ENCOUNTER — Observation Stay (HOSPITAL_COMMUNITY): Payer: No Typology Code available for payment source | Admitting: Certified Registered Nurse Anesthetist

## 2020-07-23 ENCOUNTER — Encounter (HOSPITAL_COMMUNITY): Payer: Self-pay | Admitting: Internal Medicine

## 2020-07-23 ENCOUNTER — Encounter (HOSPITAL_COMMUNITY)
Admission: EM | Disposition: A | Discharge status: Hospice | Payer: Self-pay | Source: Home / Self Care | Attending: Family Medicine

## 2020-07-23 DIAGNOSIS — Z7189 Other specified counseling: Secondary | ICD-10-CM

## 2020-07-23 DIAGNOSIS — C719 Malignant neoplasm of brain, unspecified: Secondary | ICD-10-CM | POA: Diagnosis present

## 2020-07-23 DIAGNOSIS — R1110 Vomiting, unspecified: Secondary | ICD-10-CM

## 2020-07-23 DIAGNOSIS — E861 Hypovolemia: Secondary | ICD-10-CM | POA: Diagnosis not present

## 2020-07-23 DIAGNOSIS — Z801 Family history of malignant neoplasm of trachea, bronchus and lung: Secondary | ICD-10-CM | POA: Diagnosis not present

## 2020-07-23 DIAGNOSIS — Z8249 Family history of ischemic heart disease and other diseases of the circulatory system: Secondary | ICD-10-CM | POA: Diagnosis not present

## 2020-07-23 DIAGNOSIS — R112 Nausea with vomiting, unspecified: Secondary | ICD-10-CM | POA: Diagnosis not present

## 2020-07-23 DIAGNOSIS — I69354 Hemiplegia and hemiparesis following cerebral infarction affecting left non-dominant side: Secondary | ICD-10-CM | POA: Diagnosis not present

## 2020-07-23 DIAGNOSIS — R627 Adult failure to thrive: Secondary | ICD-10-CM | POA: Diagnosis not present

## 2020-07-23 DIAGNOSIS — R109 Unspecified abdominal pain: Secondary | ICD-10-CM | POA: Diagnosis present

## 2020-07-23 DIAGNOSIS — Z8042 Family history of malignant neoplasm of prostate: Secondary | ICD-10-CM | POA: Diagnosis not present

## 2020-07-23 DIAGNOSIS — E871 Hypo-osmolality and hyponatremia: Secondary | ICD-10-CM | POA: Diagnosis present

## 2020-07-23 DIAGNOSIS — Z7952 Long term (current) use of systemic steroids: Secondary | ICD-10-CM | POA: Diagnosis not present

## 2020-07-23 DIAGNOSIS — Z87891 Personal history of nicotine dependence: Secondary | ICD-10-CM | POA: Diagnosis not present

## 2020-07-23 DIAGNOSIS — Z885 Allergy status to narcotic agent status: Secondary | ICD-10-CM | POA: Diagnosis not present

## 2020-07-23 DIAGNOSIS — K759 Inflammatory liver disease, unspecified: Secondary | ICD-10-CM | POA: Diagnosis not present

## 2020-07-23 DIAGNOSIS — E872 Acidosis: Secondary | ICD-10-CM | POA: Diagnosis not present

## 2020-07-23 DIAGNOSIS — Z83438 Family history of other disorder of lipoprotein metabolism and other lipidemia: Secondary | ICD-10-CM | POA: Diagnosis not present

## 2020-07-23 DIAGNOSIS — K7589 Other specified inflammatory liver diseases: Secondary | ICD-10-CM | POA: Diagnosis present

## 2020-07-23 DIAGNOSIS — R634 Abnormal weight loss: Secondary | ICD-10-CM | POA: Diagnosis not present

## 2020-07-23 DIAGNOSIS — Z833 Family history of diabetes mellitus: Secondary | ICD-10-CM | POA: Diagnosis not present

## 2020-07-23 DIAGNOSIS — Z923 Personal history of irradiation: Secondary | ICD-10-CM | POA: Diagnosis not present

## 2020-07-23 DIAGNOSIS — R7989 Other specified abnormal findings of blood chemistry: Secondary | ICD-10-CM | POA: Diagnosis not present

## 2020-07-23 DIAGNOSIS — Z79899 Other long term (current) drug therapy: Secondary | ICD-10-CM | POA: Diagnosis not present

## 2020-07-23 DIAGNOSIS — K716 Toxic liver disease with hepatitis, not elsewhere classified: Secondary | ICD-10-CM | POA: Diagnosis not present

## 2020-07-23 DIAGNOSIS — Z515 Encounter for palliative care: Secondary | ICD-10-CM

## 2020-07-23 DIAGNOSIS — K2100 Gastro-esophageal reflux disease with esophagitis, without bleeding: Secondary | ICD-10-CM | POA: Diagnosis present

## 2020-07-23 DIAGNOSIS — K922 Gastrointestinal hemorrhage, unspecified: Secondary | ICD-10-CM | POA: Diagnosis not present

## 2020-07-23 DIAGNOSIS — D649 Anemia, unspecified: Secondary | ICD-10-CM | POA: Diagnosis present

## 2020-07-23 DIAGNOSIS — E860 Dehydration: Secondary | ICD-10-CM

## 2020-07-23 DIAGNOSIS — C711 Malignant neoplasm of frontal lobe: Principal | ICD-10-CM

## 2020-07-23 DIAGNOSIS — K921 Melena: Secondary | ICD-10-CM | POA: Diagnosis present

## 2020-07-23 DIAGNOSIS — Z66 Do not resuscitate: Secondary | ICD-10-CM | POA: Diagnosis present

## 2020-07-23 DIAGNOSIS — T451X5A Adverse effect of antineoplastic and immunosuppressive drugs, initial encounter: Secondary | ICD-10-CM | POA: Diagnosis present

## 2020-07-23 DIAGNOSIS — Z20822 Contact with and (suspected) exposure to covid-19: Secondary | ICD-10-CM | POA: Diagnosis present

## 2020-07-23 DIAGNOSIS — K625 Hemorrhage of anus and rectum: Secondary | ICD-10-CM | POA: Diagnosis not present

## 2020-07-23 HISTORY — PX: ESOPHAGOGASTRODUODENOSCOPY (EGD) WITH PROPOFOL: SHX5813

## 2020-07-23 LAB — COMPREHENSIVE METABOLIC PANEL
ALT: 299 U/L — ABNORMAL HIGH (ref 0–44)
AST: 70 U/L — ABNORMAL HIGH (ref 15–41)
Albumin: 2 g/dL — ABNORMAL LOW (ref 3.5–5.0)
Alkaline Phosphatase: 241 U/L — ABNORMAL HIGH (ref 38–126)
Anion gap: 9 (ref 5–15)
BUN: 23 mg/dL — ABNORMAL HIGH (ref 6–20)
CO2: 21 mmol/L — ABNORMAL LOW (ref 22–32)
Calcium: 8.1 mg/dL — ABNORMAL LOW (ref 8.9–10.3)
Chloride: 100 mmol/L (ref 98–111)
Creatinine, Ser: 0.62 mg/dL (ref 0.61–1.24)
GFR, Estimated: >60 mL/min (ref >60)
Glucose, Bld: 132 mg/dL — ABNORMAL HIGH (ref 70–99)
Potassium: 3.9 mmol/L (ref 3.5–5.1)
Sodium: 130 mmol/L — ABNORMAL LOW (ref 135–145)
Total Bilirubin: 14.9 mg/dL — ABNORMAL HIGH (ref 0.3–1.2)
Total Protein: 4.3 g/dL — ABNORMAL LOW (ref 6.5–8.1)

## 2020-07-23 LAB — CBC
HCT: 29.7 % — ABNORMAL LOW (ref 39.0–52.0)
Hemoglobin: 10.2 g/dL — ABNORMAL LOW (ref 13.0–17.0)
MCH: 33.4 pg (ref 26.0–34.0)
MCHC: 34.3 g/dL (ref 30.0–36.0)
MCV: 97.4 fL (ref 80.0–100.0)
Platelets: 156 10*3/uL (ref 150–400)
RBC: 3.05 MIL/uL — ABNORMAL LOW (ref 4.22–5.81)
RDW: 21.4 % — ABNORMAL HIGH (ref 11.5–15.5)
WBC: 16.9 10*3/uL — ABNORMAL HIGH (ref 4.0–10.5)
nRBC: 0.2 % (ref 0.0–0.2)

## 2020-07-23 LAB — PROTIME-INR
INR: 1 (ref 0.8–1.2)
Prothrombin Time: 12.8 seconds (ref 11.4–15.2)

## 2020-07-23 SURGERY — ESOPHAGOGASTRODUODENOSCOPY (EGD) WITH PROPOFOL
Anesthesia: Monitor Anesthesia Care

## 2020-07-23 MED ORDER — PROPOFOL 10 MG/ML IV BOLUS
INTRAVENOUS | Status: DC | PRN
  Administered 2020-07-23 (×3): 20 mg via INTRAVENOUS
  Administered 2020-07-23: 40 mg via INTRAVENOUS
  Administered 2020-07-23: 20 mg via INTRAVENOUS

## 2020-07-23 MED ORDER — CHLORPROMAZINE HCL 25 MG PO TABS
25.0000 mg | ORAL_TABLET | Freq: Three times a day (TID) | ORAL | Status: DC | PRN
  Administered 2020-07-25: 25 mg via ORAL
  Filled 2020-07-23 (×2): qty 1

## 2020-07-23 MED ORDER — PROPOFOL 500 MG/50ML IV EMUL
INTRAVENOUS | Status: DC | PRN
  Administered 2020-07-23: 120 ug/kg/min via INTRAVENOUS

## 2020-07-23 MED ORDER — ONDANSETRON HCL 4 MG/2ML IJ SOLN
INTRAMUSCULAR | Status: DC | PRN
  Administered 2020-07-23: 4 mg via INTRAVENOUS

## 2020-07-23 MED ORDER — DEXAMETHASONE 4 MG PO TABS
8.0000 mg | ORAL_TABLET | Freq: Two times a day (BID) | ORAL | Status: DC
Start: 2020-07-23 — End: 2020-07-25
  Administered 2020-07-25: 8 mg via ORAL
  Filled 2020-07-23 (×3): qty 2

## 2020-07-23 MED ORDER — LIDOCAINE HCL (CARDIAC) PF 100 MG/5ML IV SOSY
PREFILLED_SYRINGE | INTRAVENOUS | Status: DC | PRN
  Administered 2020-07-23: 100 mg via INTRAVENOUS

## 2020-07-23 MED ORDER — PROPOFOL 500 MG/50ML IV EMUL
INTRAVENOUS | Status: AC
  Filled 2020-07-23: qty 50

## 2020-07-23 MED ORDER — DEXAMETHASONE SODIUM PHOSPHATE 10 MG/ML IJ SOLN
INTRAMUSCULAR | Status: DC | PRN
  Administered 2020-07-23: 5 mg via INTRAVENOUS

## 2020-07-23 MED ORDER — SODIUM CHLORIDE 0.9 % IV SOLN
12.5000 mg | Freq: Four times a day (QID) | INTRAVENOUS | Status: DC | PRN
  Administered 2020-07-23 – 2020-07-25 (×3): 12.5 mg via INTRAVENOUS
  Filled 2020-07-23 (×4): qty 0.5

## 2020-07-23 SURGICAL SUPPLY — 15 items

## 2020-07-23 NOTE — Progress Notes (Addendum)
Patient ID: John Watson, male   DOB: June 19, 1960, 60 y.o.   MRN: [id number]    Progress Note   Subjective   day # 1 CC; weakness LLE / fall, drug induced hepatitis ,vomiting/regurgitiation /belching, rectal bleeding  Labs-T bili down to 14.9/alk phos 241/AST 70/ALT 229 improved INR 1.0 WBC 16.9, hemoglobin 10.2 CT abdomen and pelvis yesterday- no acute abnormality  No change overnight, no vomiting or hematemesis, no further bowel movements or rectal bleeding Continues with fairly constant belching and burping.   Objective   Vital signs in last 24 hours: Temp:  [97.9 F (36.6 C)-98.9 F (37.2 C)] 98.2 F (36.8 C) (02/23 0636) Pulse Rate:  [48-61] 49 (02/23 0636) Resp:  [11-18] 15 (02/23 0636) BP: (118-144)/(77-89) 133/80 (02/23 0636) SpO2:  [97 %-100 %] 100 % (02/23 0636) Last BM Date: 07/22/20 General:    Older African-American male in NAD Heart:  Regular rate and rhythm; no murmurs Lungs: Respirations even and unlabored, lungs CTA bilaterally Abdomen:  Soft, nontender and nondistended. Normal bowel sounds. Extremities:  Without edema. Neurologic:  Alert and oriented, left-sided weakness upper and lower Psych:  Cooperative. Normal mood and affect.  Intake/Output from previous day: 02/22 0701 - 02/23 0700 In: 1600 [I.V.:1000; IV Piggyback:600] Out: 1100 [Urine:1100] Intake/Output this shift: No intake/output data recorded.  Lab Results: Recent Labs    07/22/20 0905 07/22/20 1700 07/23/20 0042  WBC 21.9*  --  16.9*  HGB 12.1* 9.7* 10.2*  HCT 34.3* 27.8* 29.7*  PLT 197  --  156   BMET Recent Labs    07/22/20 0905 07/23/20 0042  NA 128* 130*  K 4.3 3.9  CL 94* 100  CO2 22 21*  GLUCOSE 126* 132*  BUN 21* 23*  CREATININE 0.56* 0.62  CALCIUM 8.9 8.1*   LFT Recent Labs    07/23/20 0042  PROT 4.3*  ALBUMIN 2.0*  AST 70*  ALT 299*  ALKPHOS 241*  BILITOT 14.9*   PT/INR Recent Labs    07/22/20 0954 07/23/20 0042  LABPROT 12.5 12.8  INR 1.0  1.0    Studies/Results: CT Abdomen Pelvis W Contrast  Result Date: 07/22/2020 CLINICAL DATA:  Bright red blood per rectum, falls EXAM: CT ABDOMEN AND PELVIS WITH CONTRAST TECHNIQUE: Multidetector CT imaging of the abdomen and pelvis was performed using the standard protocol following bolus administration of intravenous contrast. CONTRAST:  172m OMNIPAQUE IOHEXOL 300 MG/ML  SOLN COMPARISON:  07/14/2020 FINDINGS: Lower chest: Lingula subsegmental atelectasis. Hepatobiliary: Scattered small low-density hepatic lesions characterized as cysts previously. Contracted gallbladder. No biliary dilatation. Pancreas: Unremarkable. Spleen: Unremarkable. Adrenals/Urinary Tract: Adrenals, kidneys, and bladder are unremarkable. Stomach/Bowel: Stomach is within normal limits. Bowel is normal in caliber is. Bowel is normal in caliber. Vascular/Lymphatic: Mild aortic atherosclerosis. No enlarged lymph nodes. Reproductive: Enlarged prostate as before. Other: No ascites.  No new abnormality of the abdominal wall. Musculoskeletal: No acute or significant osseous abnormality. IMPRESSION: No acute abnormality.  No findings to account for reported symptoms. Electronically Signed   By: PMacy MisM.D.   On: 07/22/2020 11:34       Assessment / Plan:    #16 60year old African-American male diagnosed with right frontal glioblastoma September 2021, status post radiation and chemotherapy with temozolomide. Initial presentation with CVA August 2021 with left-sided deficits.  Admitted yesterday after a fall at home secondary to increased weakness of the left lower extremity despite being on recent high-dose steroids  #2 severe drug-induced cholestatic hepatitis felt secondary to chemotherapy/temozolomide. Patient had  liver biopsy consistent with drug-induced liver injury/cholestasis  Parameters are improving with T bili down to 14.9 today and improvement in transaminases, INR remains normal  #3 persistent hiccuping  belching indigestion vomiting and regurgitation progressive over the past 3 to 4 months reports some intermittent very low-volume streaky hematemesis.  Onset of all of these symptoms day of brain biopsy Initial inciting event unclear, suspect he has a severe esophagitis  On IV PPI twice daily, continue IV Zofran Plan is for EGD this afternoon  #4 small-volume hematochezia x1 yesterday no recurrence-hemoglobin has drifted but no evidence for acute significant GI bleed  Continue serial hemoglobins   Active Problems:   Rectal bleeding     LOS: 0 days   Amy EsterwoodPA-C  07/23/2020, 8:49 AM   GI ATTENDING  Interval history data reviewed.  Plans for upper endoscopy this afternoon.  John Chuck. Geri Seminole., M.D. [facility] Division of Gastroenterology

## 2020-07-23 NOTE — Progress Notes (Signed)
Occupational Therapy Evaluation  Patient lives at home with his brother, reports not using AD at baseline for ambulation. When asked how brother assists pt at home with self care pt states "with everything." Currently patient with poor balance, activity tolerance and chronic L sided weakness d/t medical hx below impacting safety and independence with functional transfers and ADLs. Pt requiring two attempts and mod A to power up to standing from recliner. With walker patient needing mod A to support balance and advance L LE to EOB "it's got no strength left." Recommend continued acute OT services to maximize patient safety and independence with self care in order to facilitate D/C to venue listed below.     07/23/20 1326  OT Visit Information  Last OT Received On 07/23/20  Assistance Needed +1  History of Present Illness 60 y.o. male with medical history significant of glioblastomia, chemo-induced hepatitis, seizures, chronic hiccups. Presenting rectal bleeding and falls  Precautions  Precautions Fall  Precaution Comments 2 falls on day of admission. No other prior falls.  Restrictions  Weight Bearing Restrictions No  Home Living  Family/patient expects to be discharged to: Private residence  Living Arrangements Other relatives (brother)  Available Help at Discharge Family;Available 24 hours/day  Type of Home House  Home Access Stairs to enter  Entrance Stairs-Number of Steps 8  Entrance Stairs-Rails Left;Right  Home Layout One level  Bathroom Shower/Tub Tub/shower unit;C[place]  Corporate treasurer Yes  Home Equipment Grab bars - tub/shower;Walker - 2 wheels;BSC  Additional Comments lives with brother who is retired  Prior Function  Level of Oncologist / [place] prior to recent falls patient able to walk without AD  ADL's / [place] when asked how brother assists patient at home with ADLs  states "he does everything"  Communication  Communication No difficulties  Pain Assessment  Pain Assessment No/denies pain  Cognition  Arousal/Alertness Awake/alert  Behavior During Therapy WFL for tasks assessed/performed  Overall Cognitive Status Within Functional Limits for tasks assessed  Upper Extremity Assessment  Upper Extremity Assessment RUE deficits/detail;LUE deficits/detail  RUE Deficits / Details R UE grossly intact ROM and MMT 4/5  LUE Deficits / Details AROM grossly intact, strength 3+/5, chronic weakness 2* glioblastoma  Lower Extremity Assessment  Lower Extremity Assessment Defer to PT evaluation  ADL  Overall ADL's  Needs assistance/impaired  Eating/Feeding NPO  Grooming Set up;Sitting  Upper Body Bathing Set up;Supervision/ safety;Sitting  Lower Body Bathing Moderate assistance;Maximal assistance;Sitting/lateral leans;Sit to/from stand  Upper Body Dressing  Set up;Sitting  Lower Body Dressing Maximal assistance;Sitting/lateral leans;Sit to/from stand  Toilet Transfer Moderate assistance;Cueing for safety;Cueing for sequencing;Stand-pivot;RW  Toilet Transfer Details (indicate cue type and reason) needing two attempts to power up to standing from recliner, very limited control of L LE needing assist to advance to EOB  Toileting- Clothing Manipulation and Hygiene Total assistance;Sit to/from stand  Functional mobility during ADLs Moderate assistance;Cueing for safety;Cueing for sequencing;Rolling walker  General ADL Comments patient reports assist at baseline with self care from brother, however was ambulatory at baseline and now needing significant assistance for stand pivot transfers due to L sided weakness  Bed Mobility  Overal bed mobility Needs Assistance  Bed Mobility Sit to Supine  Sit to supine Min assist  General bed mobility comments to lift L LE into bed  Transfers  Overall transfer level Needs assistance  Equipment used Rolling walker (2 wheeled)   Transfers Sit to/from [facility]  Sit to Stand Mod assist  Stand pivot transfers Mod assist  General transfer comment please see toilet transfer in ADL section. high fall risk with limited eccentric control onto EOB  Balance  Overall balance assessment Needs assistance  Sitting-balance support Feet supported  Sitting balance-Leahy Scale Fair  Standing balance support Bilateral upper extremity supported  Standing balance-Leahy Scale Poor  Standing balance comment relies on BUE support and mod A  OT - End of Session  Equipment Utilized During Treatment Gait belt;Rolling walker  Activity Tolerance Patient limited by fatigue  Patient left in bed;with call bell/phone within reach  Nurse Communication Mobility status  OT Assessment  OT Recommendation/Assessment Patient needs continued OT Services  OT Visit Diagnosis Unsteadiness on feet (R26.81);Other abnormalities of gait and mobility (R26.89);History of falling (Z91.81)  OT Problem List Decreased strength;Decreased activity tolerance;Impaired balance (sitting and/or standing);Decreased safety awareness  OT Plan  OT Frequency (ACUTE ONLY) Min 2X/week  OT Treatment/Interventions (ACUTE ONLY) Self-care/ADL training;Therapeutic exercise;DME and/or AE instruction;Therapeutic activities;Patient/family education;Balance training  AM-PAC OT "6 Clicks" Daily Activity Outcome Measure (Version 2)  Help from another person eating meals? 4 (currently NPO, based on ROM functionally can feed himself)  Help from another person taking care of personal grooming? 3  Help from another person toileting, which includes using toliet, bedpan, or urinal? 2  Help from another person bathing (including washing, rinsing, drying)? 2  Help from another person to put on and taking off regular upper body clothing? 3  Help from another person to put on and taking off regular lower body clothing? 2  6 Click Score 16  OT Recommendation  Follow Up  Recommendations Home health OT;Supervision/Assistance - 24 hour  OT Equipment None recommended by OT  Individuals Consulted  Consulted and Agree with Results and Recommendations Patient  Acute Rehab OT Goals  Patient Stated Goal home with [facility] OT  OT Goal Formulation With patient  Time For Goal Achievement 08/06/20  Potential to Achieve Goals Good  OT Time Calculation  OT Start Time (ACUTE ONLY) 1112  OT Stop Time (ACUTE ONLY) 1127  OT Time Calculation (min) 15 min  OT General Charges  $OT Visit 1 Visit  OT Evaluation  $OT Eval Low Complexity 1 Low  Written Expression  Dominant Hand Right   Delbert Phenix OT OT pager: [phone]

## 2020-07-23 NOTE — Progress Notes (Signed)
Palliative care consult received.  Attempted to see [name] but he is currently off the floor for procedure.   Will plan to f/u for initial encounter later today or tomorrow.  Micheline Rough, MD [place] Team [phone]

## 2020-07-23 NOTE — Op Note (Signed)
[facility] Patient Name: John Watson Procedure Date: 07/23/2020 MRN: [id number] Attending MD: Docia Chuck. Henrene Pastor , MD Date of Birth: 01/19/1961 CSN: [id number] Age: 60 Admit Type: Outpatient Procedure:                Upper GI endoscopy Indications:              Anorexia, Nausea with vomiting, Weight loss. Known                            CNS tumor Providers:                Docia Chuck. Henrene Pastor, MD, Cleda Daub, RN, Ladona Ridgel, Technician Referring MD:              Medicines:                Monitored Anesthesia Care Complications:            No immediate complications. Estimated Blood Loss:     Estimated blood loss: none. Procedure:                Pre-Anesthesia Assessment:                           - Prior to the procedure, a History and Physical                            was performed, and patient medications and                            allergies were reviewed. The patient's tolerance of                            previous anesthesia was also reviewed. The risks                            and benefits of the procedure and the sedation                            options and risks were discussed with the patient.                            All questions were answered, and informed consent                            was obtained. Prior Anticoagulants: The patient has                            taken no previous anticoagulant or antiplatelet                            agents. ASA Grade Assessment: II - A patient with  mild systemic disease. After reviewing the risks                            and benefits, the patient was deemed in                            satisfactory condition to undergo the procedure.                           After obtaining informed consent, the endoscope was                            passed under direct vision. Throughout the                            procedure, the patient's blood  pressure, pulse, and                            oxygen saturations were monitored continuously. The                            GIF-H190 ([id number] Olympus gastroscope was                            introduced through the mouth, and advanced to the                            second part of duodenum. The upper GI endoscopy was                            accomplished without difficulty. The patient                            tolerated the procedure well. Scope In: Scope Out: Findings:      The esophagus was normal.      The stomach was normal.      The examined duodenum was normal.      The cardia and gastric fundus were normal on retroflexion. Impression:               1. Normal EGD                           2. No GI cause for symptoms found or suspected.                            Symptoms almost certainly central in origin                           3. Drug-induced hepatitis. Slowly improving. May                            take quite some time for resolution. Management is  expectant. Moderate Sedation:      none Recommendation:           1. Continue symptomatic therapies                           2. Ongoing management of CNS tumor per neuro                            oncology                           3. Advance diet as tolerated                           GI will sign off. Thank you. Procedure Code(s):        --- Professional ---                           [phone], Esophagogastroduodenoscopy, flexible,                            transoral; diagnostic, including collection of                            specimen(s) by brushing or washing, when performed                            (separate procedure) Diagnosis Code(s):        --- Professional ---                           R63.0, Anorexia                           R11.2, Nausea with vomiting, unspecified                           R63.4, Abnormal weight loss CPT copyright 2019 [employer]. All  rights reserved. The codes documented in this report are preliminary and upon coder review may  be revised to meet current compliance requirements. Docia Chuck. Henrene Pastor, MD 07/23/2020 1:52:12 PM This report has been signed electronically. Number of Addenda: 0

## 2020-07-23 NOTE — Anesthesia Postprocedure Evaluation (Signed)
Anesthesia Post Note  Patient: John Watson  Procedure(s) Performed: ESOPHAGOGASTRODUODENOSCOPY (EGD) WITH PROPOFOL (N/A )     Patient location during evaluation: Endoscopy Anesthesia Type: MAC Level of consciousness: awake and alert Pain management: pain level controlled Vital Signs Assessment: post-procedure vital signs reviewed and stable Respiratory status: spontaneous breathing, nonlabored ventilation, respiratory function stable and patient connected to nasal cannula oxygen Cardiovascular status: blood pressure returned to baseline and stable Postop Assessment: no apparent nausea or vomiting Anesthetic complications: no   No complications documented.  Last Vitals:  Vitals:   07/23/20 1350 07/23/20 1400  BP: 129/86 139/81  Pulse: 64 61  Resp: (!) 22 (!) 21  Temp: 36.9 C   SpO2: 100% 99%    Last Pain:  Vitals:   07/23/20 1400  TempSrc:   PainSc: 0-No pain                 Barnet Glasgow

## 2020-07-23 NOTE — Progress Notes (Signed)
Patient suffers from glioblastoma, LLE weakness which impairs their ability to perform daily activities like walking in the home.  A walker alone will not resolve the issues with performing activities of daily living. A lightweight wheelchair will allow patient to safely perform daily activities.  The patient can self propel in the home or has a caregiver who can provide assistance.     Blondell Reveal Kistler PT 07/23/2020  Acute Rehabilitation Services Pager [phone] Office [phone]

## 2020-07-23 NOTE — Transfer of Care (Signed)
Immediate Anesthesia Transfer of Care Note  Patient: John Watson  Procedure(s) Performed: ESOPHAGOGASTRODUODENOSCOPY (EGD) WITH PROPOFOL (N/A )  Patient Location: Endoscopy Unit  Anesthesia Type:MAC  Level of Consciousness: awake, alert , oriented and patient cooperative  Airway & Oxygen Therapy: Patient Spontanous Breathing and Patient connected to face mask oxygen  Post-op Assessment: Report given to RN and Post -op Vital signs reviewed and stable  Post vital signs: Reviewed and stable  Last Vitals:  Vitals Value Taken Time  BP 118/85 1344  Temp    Pulse 73   Resp    SpO2 100%     Last Pain:  Vitals:   07/23/20 1237  TempSrc: Oral  PainSc: 0-No pain      Patients Stated Pain Goal: 2 (08/65/78 4696)  Complications: No complications documented.

## 2020-07-23 NOTE — Anesthesia Preprocedure Evaluation (Addendum)
Anesthesia Evaluation  Patient identified by MRN, date of birth, ID band Patient awake    Reviewed: Allergy & Precautions, NPO status , Patient's Chart, lab work & pertinent test results  Airway Mallampati: II  TM Distance: >3 FB Neck ROM: Full    Dental no notable dental hx. (+) Teeth Intact   Pulmonary neg pulmonary ROS, former smoker,    Pulmonary exam normal breath sounds clear to auscultation       Cardiovascular Exercise Tolerance: Good Normal cardiovascular exam Rhythm:Regular Rate:Normal     Neuro/Psych Seizures -,  Hx of Glioblastoma CVA (L LE weakness), Residual Symptoms    GI/Hepatic GERD  ,(+) Hepatitis -Lab Results      Component                Value               Date                      ALT                      299 (H)             07/23/2020                AST                      70 (H)              07/23/2020                ALKPHOS                  241 (H)             07/23/2020                BILITOT                  14.9 (H)            07/23/2020              Endo/Other  negative endocrine ROS  Renal/GU negative Renal ROSLab Results      Component                Value               Date                      CREATININE               0.62                07/23/2020                BUN                      23 (H)              07/23/2020                NA                       130 (L)             07/23/2020                K  3.9                 07/23/2020                CL                       100                 07/23/2020                CO2                      21 (L)              07/23/2020               Musculoskeletal negative musculoskeletal ROS (+)   Abdominal   Peds  Hematology  (+) anemia , Lab Results      Component                Value               Date                      WBC                      16.9 (H)            07/23/2020                HGB                       10.2 (L)            07/23/2020                HCT                      29.7 (L)            07/23/2020                MCV                      97.4                07/23/2020                PLT                      156                 07/23/2020              Anesthesia Other Findings Pt with icteric sclera bilaterally has been present for months  Reproductive/Obstetrics                            Anesthesia Physical Anesthesia Plan  ASA: IV  Anesthesia Plan: MAC   Post-op Pain Management:    Induction:   PONV Risk Score and Plan: Treatment may vary due to age or medical condition, Ondansetron and Dexamethasone  Airway Management Planned: Natural Airway  Additional Equipment:   Intra-op Plan:   Post-operative Plan:   Informed Consent: I have reviewed the patients History and Physical, chart, labs and discussed the procedure including the risks, benefits and  alternatives for the proposed anesthesia with the patient or authorized representative who has indicated his/her understanding and acceptance.     Dental advisory given  Plan Discussed with: CRNA and Anesthesiologist  Anesthesia Plan Comments: (EGD for Hematochezia)        Anesthesia Quick Evaluation

## 2020-07-23 NOTE — Progress Notes (Addendum)
PROGRESS NOTE   John Watson  JJH:[id number] DOB: [birth year] DOA: 07/22/2020 PCP: Pcp, No  Brief Narrative:  38 home dwelling M Frontal lobe glioblastoma (stereotactic biopsy 02/13/2020-Dr. 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  Esophagitis with hiccups belching EGD 2/23 no obvious cause for rectal bleeding GI has evaluated and feel this is central related Continue saline 125 cc/H but can advance diet as per GI I have added IV Thorazine in addition to the p.o. to help with his intractable hiccups Frontal lobe glioblastoma status post resection/unilateral left-sided weakness No chemo for the past month-adding Dr. Mickeal Watson neuro-oncology to treatment team for impressions Continue Decadron 8 mg twice daily taper as per oncology Palliative care engaged on admission to help consolidate goals of care See below discussions Chronic seizures on Keppra Continue Keppra 1 g twice daily-transition to p.o. 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 John Watson [phone]--- he tells me that he would really like to take his brother home for end-of-life care and is willing to do what ever it takes to try to take him home I have mentioned to him that I would like Dr. Renda Watson opinion to ensure that there are no other palliative options but that we can engage hospice come out to the home and I have already ordered his DME wheelchair that was recommended by therapy Emotional counseling attempted and given Disposition:  Status is: Inpatient  Additionally I had a long discussion with the patient at the bedside-he tells me "I am ready to die"-he understands that the intent of his chemotherapy was to help palliate things and give him more time He is open to palliative care seeing him  The patient will require care spanning > 2 midnights and should be moved to inpatient because: Persistent severe electrolyte disturbances, Unsafe d/c plan, IV treatments appropriate due to intensity of illness or inability to take PO and Inpatient level of care appropriate due to severity of illness  Dispo: The patient is from: Home              Anticipated d/c is to: Home              Anticipated d/c date is: 3 days              Patient currently is not medically stable to d/c.   Difficult to place patient No       Consultants:   Gastroenterology  I have requested neuro-oncology to see patient when able  Procedures:  EGD 2/23 impression:               Normal EGD No GI cause for symptoms found or suspected.  Antimicrobials: None currently   Subjective: Flat affect Continuous hiccups No chest pain no fever Has not eaten since admission Feels still weak on the left side  Objective: Vitals:   07/23/20 1237 07/23/20 1344 07/23/20 1350 07/23/20 1400  BP: 124/76 118/85 129/86 139/81  Pulse:  78 64 61  Resp: 15 20 (!) 22 (!) 21  Temp: 98.4 F (36.9 C)  98.5 F  (36.9 C)   TempSrc: Oral  Oral   SpO2: 97% 100% 100% 99%  Weight:      Height:        Intake/Output Summary (Last 24 hours) at 07/23/2020 1520 Last data filed at 07/23/2020 1344 Gross per 24 hour  Intake 1600 ml  Output 1500 ml  Net 100 ml   Filed Weights   07/22/20 0846  Weight: 74.8 kg    Examination:  Marked scleral icterus Continued hiccups Power 5/5 on left lower extremity however unable to raise that leg as high as on the right side Cannot bend knee S1-S2 no murmur Abdomen distended no rebound no guarding ROM intact to upper extremity   Data Reviewed: personally reviewed   CBC    Component Value Date/Time   WBC 16.9 (H) 07/23/2020 0042   RBC 3.05 (L) 07/23/2020 0042   HGB 10.2 (L) 07/23/2020 0042   HGB 11.3 (L) 06/05/2020 1145   HCT 29.7 (L) 07/23/2020 0042   PLT 156 07/23/2020 0042   PLT 190 06/05/2020 1145   MCV 97.4 07/23/2020 0042   MCH 33.4 07/23/2020 0042   MCHC 34.3 07/23/2020 0042   RDW 21.4 (H) 07/23/2020 0042   LYMPHSABS 0.7 07/14/2020 0036   MONOABS 1.0 07/14/2020 0036   EOSABS 0.0 07/14/2020 0036   BASOSABS 0.1 07/14/2020 0036   CMP Latest Ref Rng & Units 07/23/2020 07/22/2020 07/14/2020  Glucose 70 - 99 mg/dL 132(H) 126(H) 186(H)  BUN 6 - 20 mg/dL 23(H) 21(H) 23(H)  Creatinine 0.61 - 1.24 mg/dL 0.62 0.56(L) 1.20  Sodium 135 - 145 mmol/L 130(L) 128(L) 138  Potassium 3.5 - 5.1 mmol/L 3.9 4.3 4.1  Chloride 98 - 111 mmol/L 100 94(L) 105  CO2 22 - 32 mmol/L 21(L) 22 -  Calcium 8.9 - 10.3 mg/dL 8.1(L) 8.9 -  Total Protein 6.5 - 8.1 g/dL 4.3(L) 5.3(L) 5.4(L)  Total Bilirubin 0.3 - 1.2 mg/dL 14.9(H) 19.7(HH) 20.7(HH)  Alkaline Phos 38 - 126 U/L 241(H) 334(H) 384(H)  AST 15 - 41 U/L 70(H) 112(H) 146(H)  ALT 0 - 44 U/L 299(H) 441(H) 353(H)     Radiology Studies: CT Abdomen Pelvis W Contrast  Result Date: 07/22/2020 CLINICAL DATA:  Bright red blood per rectum, falls EXAM: CT ABDOMEN AND PELVIS WITH CONTRAST TECHNIQUE: Multidetector CT imaging  of the abdomen and pelvis was performed using the standard protocol following bolus administration of intravenous contrast. CONTRAST:  139mL OMNIPAQUE IOHEXOL 300 MG/ML  SOLN COMPARISON:  07/14/2020 FINDINGS: Lower chest: Lingula subsegmental atelectasis. Hepatobiliary: Scattered small low-density hepatic lesions characterized as cysts previously. Contracted gallbladder. No biliary dilatation. Pancreas: Unremarkable. Spleen: Unremarkable. Adrenals/Urinary Tract: Adrenals, kidneys, and bladder are unremarkable. Stomach/Bowel: Stomach is within normal limits. Bowel is normal in caliber is. Bowel is normal in caliber. Vascular/Lymphatic: Mild aortic atherosclerosis. No enlarged lymph nodes. Reproductive: Enlarged prostate as before. Other: No ascites.  No new abnormality of the abdominal wall. Musculoskeletal: No acute or significant osseous abnormality. IMPRESSION: No acute abnormality.  No findings to account for reported symptoms. Electronically Signed   By: Macy Mis M.D.   On:  07/22/2020 11:34     Scheduled Meds: . dexamethasone  8 mg Oral BID  . pantoprazole (PROTONIX) IV  40 mg Intravenous Q12H   Continuous Infusions: . sodium chloride 125 mL/hr at 07/23/20 1320  . levETIRAcetam 1,000 mg (07/23/20 1026)     LOS: 0 days   Time spent: [place], MD [facility] To contact the attending provider between 7A-7P or the covering provider during after hours 7P-7A, please log into the web site [email or web address] and access using universal [place] password for that web site. If you do not have the password, please call the hospital operator.  07/23/2020, 3:20 PM

## 2020-07-23 NOTE — Evaluation (Addendum)
Physical Therapy Evaluation Patient Details Name: John Watson MRN: [id number] DOB: [birth year] Today's Date: 07/23/2020   History of Present Illness  60 y.o. male with medical history significant of glioblastomia, chemo-induced hepatitis, seizures, chronic hiccups. Presenting rectal bleeding and falls  Clinical Impression  Pt admitted with above diagnosis. Pt ambulated 41' with RW, distance limited by LLE fatigue. Pt reports LLE buckled twice yesterday resulting in 2 falls at home. L knee extension strength +2/5. Pt currently with functional limitations due to the deficits listed below (see PT Problem List). Pt will benefit from skilled PT to increase their independence and safety with mobility to allow discharge to the venue listed below.       Follow Up Recommendations Home health PT    Equipment Recommendations  Wheelchair, wheelchair cushion    Recommendations for Other Services       Precautions / Restrictions Precautions Precautions: Fall Precaution Comments: 2 falls on day of admission. No other prior falls. Restrictions Weight Bearing Restrictions: No      Mobility  Bed Mobility Overal bed mobility: Needs Assistance Bed Mobility: Supine to Sit     Supine to sit: Min assist     General bed mobility comments: min A to advance LLE and pivot L hips to EOB    Transfers Overall transfer level: Needs assistance Equipment used: Rolling walker (2 wheeled) Transfers: Sit to/from Stand Sit to Stand: From elevated surface;Min assist         General transfer comment: assist to power up, VCs hand placement  Ambulation/Gait Ambulation/Gait assistance: Min guard Gait Distance (Feet): 45 Feet Assistive device: Rolling walker (2 wheeled) Gait Pattern/deviations: Step-to pattern;Decreased step length - left Gait velocity: decr   General Gait Details: VCs sequencing and positioning in RW, difficulty fully advancing LLE but no buckling noted, pt stated he felt LLE  getting weak and that it would buckle soon if he continued walking  Stairs            Wheelchair Mobility    Modified Rankin (Stroke Patients Only)       Balance Overall balance assessment: Needs assistance Sitting-balance support: Feet supported;Single extremity supported Sitting balance-Leahy Scale: Poor Sitting balance - Comments: posterior lean initially, then fair     Standing balance-Leahy Scale: Poor Standing balance comment: relies on BUE support                             Pertinent Vitals/Pain Pain Assessment: No/denies pain    Home Living Family/patient expects to be discharged to:: Private residence Living Arrangements: Other relatives Available Help at Discharge: Family;Available 24 hours/day Type of Home: House Home Access: Stairs to enter Entrance Stairs-Rails: Chemical engineer of Steps: 8 Home Layout: One level Home Equipment: Grab bars - tub/shower;Walker - 2 wheels;Bedside commode Additional Comments: lives with brother who is retired    Prior Function Level of Independence: Independent         Comments: able to walk without AD     Hand Dominance        Extremity/Trunk Assessment   Upper Extremity Assessment Upper Extremity Assessment: Defer to OT evaluation    Lower Extremity Assessment Lower Extremity Assessment: LLE deficits/detail LLE Deficits / Details: knee ext AROM -25*, PROM -5*, knee ext +2/5, ankle DF to 0* AROM LLE Sensation: WNL LLE Coordination: WNL    Cervical / Trunk Assessment Cervical / Trunk Assessment: Normal  Communication   Communication: No difficulties  Cognition  Arousal/Alertness: Awake/alert Behavior During Therapy: WFL for tasks assessed/performed Overall Cognitive Status: Within Functional Limits for tasks assessed                                        General Comments      Exercises General Exercises - Lower Extremity Ankle Circles/Pumps:  AROM;Left;10 reps;Supine Quad Sets: AROM;Left;5 reps;Supine Heel Slides: AAROM;Left;5 reps   Assessment/Plan    PT Assessment Patient needs continued PT services  PT Problem List Decreased range of motion;Decreased activity tolerance;Decreased balance;Decreased knowledge of use of DME;Decreased mobility;Decreased strength       PT Treatment Interventions Gait training;Functional mobility training;Therapeutic activities;Therapeutic exercise;Patient/family education;Stair training    PT Goals (Current goals can be found in the Care Plan section)  Acute Rehab PT Goals Patient Stated Goal: to walk better PT Goal Formulation: With patient Time For Goal Achievement: 08/06/20 Potential to Achieve Goals: Fair    Frequency Min 3X/week   Barriers to discharge        Co-evaluation               AM-PAC PT "6 Clicks" Mobility  Outcome Measure Help needed turning from your back to your side while in a flat bed without using bedrails?: A Little Help needed moving from lying on your back to sitting on the side of a flat bed without using bedrails?: A Little Help needed moving to and from a bed to a chair (including a wheelchair)?: A Little Help needed standing up from a chair using your arms (e.g., wheelchair or bedside chair)?: A Lot Help needed to walk in hospital room?: A Little Help needed climbing 3-5 steps with a railing? : A Lot 6 Click Score: 16    End of Session Equipment Utilized During Treatment: Gait belt Activity Tolerance: Patient tolerated treatment well Patient left: in chair;with call bell/phone within reach;with chair alarm set Nurse Communication: Mobility status;Other (comment) (emptied 400 cc urine) PT Visit Diagnosis: Muscle weakness (generalized) (M62.81);Difficulty in walking, not elsewhere classified (R26.2);Hemiplegia and hemiparesis Hemiplegia - Right/Left: Left    Time: 9604-5409 PT Time Calculation (min) (ACUTE ONLY): 40 min   Charges:   PT  Evaluation $PT Eval Low Complexity: 1 Low PT Treatments $Gait Training: 8-22 mins $Therapeutic Exercise: 8-22 mins       Blondell Reveal Kistler PT 07/23/2020  Acute Rehabilitation Services Pager [phone] Office [phone]

## 2020-07-23 NOTE — Consult Note (Signed)
Consultation Note Date: 07/23/2020   Patient Name: John Watson  DOB: 01-12-1961  MRN: [id number]  Age / Sex: 60 y.o., male  PCP: Pcp, No Referring Physician: Nita Sells, MD  Reason for Consultation: Establishing goals of care  HPI/Patient Profile: 61 y.o. male  with past medical history of frontal lobe glioblastoma, seizures, CVA admitted on 07/22/2020 with BRBPR, left sided weakness, and falls.  He underwent EGD today.  Palliative consulted for [place].   Clinical Assessment and Goals of Care: I met today with John Watson.   I introduced palliative care as specialized medical care for people living with serious illness. It focuses on providing relief from the symptoms and stress of a serious illness. The goal is to improve quality of life for both the patient and the family.  We discussed clinical course as well as wishes moving forward in regard to advanced directives and his care plan this hospitalization.   He reports that he is "just tired."  I asked him to talk further about this and he reports that he know he has a terminal disease, his functional status continues to worsen, he feels worse on a daily basis, and he is ready to die.   He knows his family will be taken care of and he reports that this makes him comfortable in knowing that his time is short.  We discussed difference between a aggressive medical intervention path and a palliative, comfort focused care path.    He reports that he just wants to transition home (he lives with his brother) with hospice to be comfortable with whatever time he has left.  He reports hoping death is quick and painless.  He also tells me that he desires to go home with hospice even if there are further interventions that may be offered from [name].  I told him that either way, I would like for [name] to weigh in on any recommendations for improving  his quality of life moving forward.  Questions and concerns addressed.   PMT will continue to support holistically.  He asked me to call his brother.  I called and left a VM as he did not answer when I called.  SUMMARY OF RECOMMENDATIONS   - DNR/DNI - John Watson reports that he would like to transition home with hospice support.  Will work in conjunction with [facility] tomorrow to make arrangements. - Attempted to call his brother to discuss but I did not reach him this evening.  Left VM. - Will work tomorrow to connect with [name] for his input/recommendations  Code Status/Advance Care Planning: DNR   Prognosis:   < 6 months  Discharge Planning: Home with Hospice      Primary Diagnoses: Present on Admission: . Rectal bleeding . Brain malignancy ([place])   I have reviewed the medical record, interviewed the patient and family, and examined the patient. The following aspects are pertinent.  Past Medical History:  Diagnosis Date  . Glioblastoma ([place]) 02/13/2020  . Pneumonia 2012  . Seizure (  [name]) 8/16/211   last one 02/02/2020  . Stroke [facility])    Left leg weakness   Social History   Socioeconomic History  . Marital status: Single    Spouse name: Not on file  . Number of children: 1  . Years of education: Not on file  . Highest education level: Not on file  Occupational History  . Not on file  Tobacco Use  . Smoking status: Former Smoker    Years: 2.00    Types: Cigars    Quit date: 2002    Years since quitting: 20.1  . Smokeless tobacco: Never Used  Vaping Use  . Vaping Use: Never used  Substance and Sexual Activity  . Alcohol use: Not Currently  . Drug use: No  . Sexual activity: Yes    Birth control/protection: None  Other Topics Concern  . Not on file  Social History Narrative  . Not on file   Social Determinants of Health   Financial Resource Strain: Not on file  Food Insecurity: Not on file  Transportation Needs: Not on file  Physical Activity:  Not on file  Stress: Not on file  Social Connections: Not on file   Family History  Problem Relation Age of Onset  . Hypertension Mother   . Diabetes Mother   . Lung cancer Father   . Hypertension Sister   . Prostate cancer Brother   . Prostate cancer Paternal Grandfather   . Hypertension Brother   . Hypertension Brother   . Hyperlipidemia Brother    Scheduled Meds: . dexamethasone  8 mg Oral BID  . pantoprazole (PROTONIX) IV  40 mg Intravenous Q12H   Continuous Infusions: . sodium chloride 125 mL/hr at 07/23/20 1831  . chlorproMAZINE (THORAZINE) IV 12.5 mg (07/23/20 2150)  . levETIRAcetam 1,000 mg (07/23/20 1026)   PRN Meds:.chlorproMAZINE (THORAZINE) IV, chlorproMAZINE, ondansetron **OR** ondansetron (ZOFRAN) IV Medications Prior to Admission:  Prior to Admission medications   Medication Sig Start Date End Date Taking? Authorizing Provider  chlorproMAZINE (THORAZINE) 25 MG tablet Take 1 tablet (25 mg total) by mouth 3 (three) times daily as needed for hiccoughs. 06/06/20  Yes Vaslow, Acey Lav, MD  dexamethasone (DECADRON) 4 MG tablet Take 2 tablets (8 mg total) by mouth 2 (two) times daily. 07/08/20  Yes Vaslow, Acey Lav, MD  levETIRAcetam (KEPPRA) 1000 MG tablet Take 1 tablet (1,000 mg total) by mouth 2 (two) times daily. 03/27/20 04/26/20 Yes Vaslow, Acey Lav, MD  Multiple Vitamin (MULTIVITAMIN WITH MINERALS) TABS tablet Take 1 tablet by mouth daily.   Yes [provider]  ondansetron (ZOFRAN) 8 MG tablet Take 1 tablet (8 mg total) by mouth 2 (two) times daily as needed (nausea and vomiting). May take 30-60 minutes prior to Temodar administration if nausea/vomiting occurs. Patient taking differently: Take 8 mg by mouth 2 (two) times daily as needed for nausea or vomiting. 03/07/20  Yes Vaslow, Acey Lav, MD  pantoprazole (PROTONIX) 40 MG tablet Take 1 tablet (40 mg total) by mouth 2 (two) times daily. Take 30-60 minutes before breakfast and dinner 06/11/20  Yes Lemmon,  Lavone Nian, PA  gabapentin (NEURONTIN) 100 MG capsule Take 1 capsule (100 mg total) by mouth 3 (three) times daily. Patient not taking: No sig reported 07/14/20   Flora Lipps, MD   Allergies  Allergen Reactions  . Vicodin [Hydrocodone-Acetaminophen] Anaphylaxis   Review of Systems  Constitutional: Positive for activity change and fatigue.  Cardiovascular: Positive for leg swelling.  Gastrointestinal: Positive for blood  in stool.  Skin: Positive for color change.  Neurological: Positive for weakness.  Psychiatric/Behavioral: Positive for sleep disturbance.   Physical Exam General: Alert, awake, in no acute distress.  HEENT: Scleral icterus Heart: Regular rate and rhythm. No murmur appreciated. Lungs: Good air movement, clear Abdomen: Soft, nontender, nondistended, positive bowel sounds.  Skin: Warm and dry  Vital Signs: BP 139/81   Pulse 61   Temp 98.5 F (36.9 C) (Oral)   Resp (!) 21   Ht '5\' 11"'  (1.803 m)   Wt 74.8 kg   SpO2 99%   BMI 23.01 kg/m  Pain Scale: 0-10   Pain Score: 0-No pain   SpO2: SpO2: 99 % O2 Device:SpO2: 99 % O2 Flow Rate: .O2 Flow Rate (L/min): 4 L/min  IO: Intake/output summary:   Intake/Output Summary (Last 24 hours) at 07/23/2020 2214 Last data filed at 07/23/2020 1344 Gross per 24 hour  Intake 1600 ml  Output 1300 ml  Net 300 ml    LBM: Last BM Date: 07/22/20 Baseline Weight: Weight: 74.8 kg Most recent weight: Weight: 74.8 kg     Palliative Assessment/Data:   Flowsheet Rows   Flowsheet Row Most Recent Value  Intake Tab   Referral Department Hospitalist  Unit at Time of Referral Oncology Unit  Palliative Care Primary Diagnosis Cancer  Date Notified 07/22/20  Palliative Care Type New Palliative care  Reason for referral Clarify Goals of Care  Date of Admission 07/22/20  Date first seen by Palliative Care 07/23/20  # of days Palliative referral response time 1 Day(s)  # of days IP prior to Palliative referral 0   Clinical Assessment   Palliative Performance Scale Score 40%  Psychosocial & Spiritual Assessment   Palliative Care Outcomes   Patient/Family meeting held? Yes  Who was at the meeting? patient     Total Time: 75 minutes Greater than 50%  of this time was spent counseling and coordinating care related to the above assessment and plan.  Signed by: Micheline Rough, MD   Please contact Palliative Medicine Team phone at [phone] for questions and concerns.  For individual provider: See Shea Evans

## 2020-07-24 ENCOUNTER — Encounter (HOSPITAL_COMMUNITY): Payer: Self-pay | Admitting: Internal Medicine

## 2020-07-24 DIAGNOSIS — K922 Gastrointestinal hemorrhage, unspecified: Secondary | ICD-10-CM

## 2020-07-24 DIAGNOSIS — Z7189 Other specified counseling: Secondary | ICD-10-CM | POA: Diagnosis not present

## 2020-07-24 DIAGNOSIS — C719 Malignant neoplasm of brain, unspecified: Secondary | ICD-10-CM | POA: Diagnosis not present

## 2020-07-24 DIAGNOSIS — C711 Malignant neoplasm of frontal lobe: Secondary | ICD-10-CM | POA: Diagnosis not present

## 2020-07-24 DIAGNOSIS — Z87891 Personal history of nicotine dependence: Secondary | ICD-10-CM

## 2020-07-24 DIAGNOSIS — R627 Adult failure to thrive: Secondary | ICD-10-CM | POA: Diagnosis not present

## 2020-07-24 DIAGNOSIS — K759 Inflammatory liver disease, unspecified: Secondary | ICD-10-CM

## 2020-07-24 DIAGNOSIS — K716 Toxic liver disease with hepatitis, not elsewhere classified: Secondary | ICD-10-CM | POA: Diagnosis not present

## 2020-07-24 DIAGNOSIS — G8194 Hemiplegia, unspecified affecting left nondominant side: Secondary | ICD-10-CM

## 2020-07-24 DIAGNOSIS — Z515 Encounter for palliative care: Secondary | ICD-10-CM | POA: Diagnosis not present

## 2020-07-24 DIAGNOSIS — Z8673 Personal history of transient ischemic attack (TIA), and cerebral infarction without residual deficits: Secondary | ICD-10-CM

## 2020-07-24 LAB — COMPREHENSIVE METABOLIC PANEL
ALT: 269 U/L — ABNORMAL HIGH (ref 0–44)
AST: 67 U/L — ABNORMAL HIGH (ref 15–41)
Albumin: 2.1 g/dL — ABNORMAL LOW (ref 3.5–5.0)
Alkaline Phosphatase: 238 U/L — ABNORMAL HIGH (ref 38–126)
Anion gap: 10 (ref 5–15)
BUN: 20 mg/dL (ref 6–20)
CO2: 19 mmol/L — ABNORMAL LOW (ref 22–32)
Calcium: 7.9 mg/dL — ABNORMAL LOW (ref 8.9–10.3)
Chloride: 101 mmol/L (ref 98–111)
Creatinine, Ser: 0.67 mg/dL (ref 0.61–1.24)
GFR, Estimated: >60 mL/min (ref >60)
Glucose, Bld: 138 mg/dL — ABNORMAL HIGH (ref 70–99)
Potassium: 3.4 mmol/L — ABNORMAL LOW (ref 3.5–5.1)
Sodium: 130 mmol/L — ABNORMAL LOW (ref 135–145)
Total Bilirubin: 15.4 mg/dL — ABNORMAL HIGH (ref 0.3–1.2)
Total Protein: 4.6 g/dL — ABNORMAL LOW (ref 6.5–8.1)

## 2020-07-24 LAB — CBC WITH DIFFERENTIAL/PLATELET
Abs Immature Granulocytes: 0.58 10*3/uL — ABNORMAL HIGH (ref 0.00–0.07)
Basophils Absolute: 0 10*3/uL (ref 0.0–0.1)
Basophils Relative: 0 %
Eosinophils Absolute: 0 10*3/uL (ref 0.0–0.5)
Eosinophils Relative: 0 %
HCT: 30.9 % — ABNORMAL LOW (ref 39.0–52.0)
Hemoglobin: 10.7 g/dL — ABNORMAL LOW (ref 13.0–17.0)
Immature Granulocytes: 6 %
Lymphocytes Relative: 4 %
Lymphs Abs: 0.4 10*3/uL — ABNORMAL LOW (ref 0.7–4.0)
MCH: 33.4 pg (ref 26.0–34.0)
MCHC: 34.6 g/dL (ref 30.0–36.0)
MCV: 96.6 fL (ref 80.0–100.0)
Monocytes Absolute: 0.4 10*3/uL (ref 0.1–1.0)
Monocytes Relative: 4 %
Neutro Abs: 8.5 10*3/uL — ABNORMAL HIGH (ref 1.7–7.7)
Neutrophils Relative %: 86 %
Platelets: 152 10*3/uL (ref 150–400)
RBC: 3.2 MIL/uL — ABNORMAL LOW (ref 4.22–5.81)
RDW: 21 % — ABNORMAL HIGH (ref 11.5–15.5)
WBC: 9.9 10*3/uL (ref 4.0–10.5)
nRBC: 0.4 % — ABNORMAL HIGH (ref 0.0–0.2)

## 2020-07-24 MED ORDER — LEVETIRACETAM 500 MG PO TABS
1000.0000 mg | ORAL_TABLET | Freq: Two times a day (BID) | ORAL | Status: DC
  Administered 2020-07-24 – 2020-07-25 (×2): 1000 mg via ORAL
  Filled 2020-07-24 (×2): qty 2

## 2020-07-24 MED ORDER — HYDROXYZINE HCL 50 MG/ML IM SOLN
25.0000 mg | Freq: Four times a day (QID) | INTRAMUSCULAR | Status: DC | PRN
  Administered 2020-07-24: 25 mg via INTRAMUSCULAR
  Filled 2020-07-24 (×2): qty 0.5

## 2020-07-24 NOTE — Progress Notes (Addendum)
PROGRESS NOTE   John Watson  HLK:[id number] DOB: August 31, 1960 DOA: 07/22/2020 PCP: Pcp, No  Brief Narrative:  58 home dwelling M Frontal lobe glioblastoma (stereotactic biopsy 02/13/2020-Dr. 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  Esophagitis with hiccups belching EGD 2/23 no obvious cause for rectal bleeding GI has evaluated and feel this is central related Continue  IV Thorazine in addition to the p.o. to help with his intractable hiccups  Saline lock IV Frontal lobe glioblastoma status post resection/unilateral left-sided weakness Appreciate in advance Dr. 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 [phone] Disposition:  Status is: Inpatient  The patient will require care spanning > 2 midnights and should be moved to inpatient because: Persistent severe electrolyte disturbances, Unsafe d/c plan, IV treatments appropriate due to intensity of illness or  inability to take PO and Inpatient level of care appropriate due to severity of illness  Dispo: The patient is from: Home              Anticipated d/c is to: To be determined              Anticipated d/c date is: 1 day              Patient currently is not medically stable to d/c.   Difficult to place patient No  Consultants:   Gastroenterology  I have requested neuro-oncology to see patient when able  Procedures:  EGD 2/23 impression:               Normal EGD No GI cause for symptoms found or suspected.                           Antimicrobials: None currently   Subjective:  Hiccups seem about the same in addition he is now having worsening headaches and he cannot move his left lower extremity at all whereas yesterday he was able to  Objective: Vitals:   07/23/20 1350 07/23/20 1400 07/23/20 2257 07/24/20 0358  BP: 129/86 139/81 124/80 137/77  Pulse: 64 61 (!) 101   Resp: (!) 22 (!) 21 18 16   Temp: 98.5 F (36.9 C)  98 F (36.7 C) 98.7 F (37.1 C)  TempSrc: Oral   Oral  SpO2: 100% 99% 100% 100%  Weight:      Height:        Intake/Output Summary (Last 24 hours) at 07/24/2020 1525 Last data filed at 07/24/2020 0500 Gross per 24 hour  Intake 1402.67 ml  Output 500 ml  Net 902.67 ml   Filed Weights   07/22/20 0846  Weight: 74.8 kg  Examination:  Marked scleral icterus Continued hiccups Power currently 1/5 left leg compared to right leg with decreased sensation to cool touch cough as well as tibial area of left lower extremity--- some foot drop noted S1-S2 no murmur Weaker in left upper extremity slightly with decrease in grip strength    Data Reviewed: personally reviewed   CBC    Component Value Date/Time   WBC 9.9 07/24/2020 0402   RBC 3.20 (L) 07/24/2020 0402   HGB 10.7 (L) 07/24/2020 0402   HGB 11.3 (L) 06/05/2020 1145   HCT 30.9 (L) 07/24/2020 0402   PLT 152 07/24/2020 0402   PLT 190 06/05/2020 1145   MCV 96.6 07/24/2020 0402   MCH 33.4  07/24/2020 0402   MCHC 34.6 07/24/2020 0402   RDW 21.0 (H) 07/24/2020 0402   LYMPHSABS 0.4 (L) 07/24/2020 0402   MONOABS 0.4 07/24/2020 0402   EOSABS 0.0 07/24/2020 0402   BASOSABS 0.0 07/24/2020 0402   CMP Latest Ref Rng & Units 07/24/2020 07/23/2020 07/22/2020  Glucose 70 - 99 mg/dL 138(H) 132(H) 126(H)  BUN 6 - 20 mg/dL 20 23(H) 21(H)  Creatinine 0.61 - 1.24 mg/dL 0.67 0.62 0.56(L)  Sodium 135 - 145 mmol/L 130(L) 130(L) 128(L)  Potassium 3.5 - 5.1 mmol/L 3.4(L) 3.9 4.3  Chloride 98 - 111 mmol/L 101 100 94(L)  CO2 22 - 32 mmol/L 19(L) 21(L) 22  Calcium 8.9 - 10.3 mg/dL 7.9(L) 8.1(L) 8.9  Total Protein 6.5 - 8.1 g/dL 4.6(L) 4.3(L) 5.3(L)  Total Bilirubin 0.3 - 1.2 mg/dL 15.4(H) 14.9(H) 19.7(HH)  Alkaline Phos 38 - 126 U/L 238(H) 241(H) 334(H)  AST 15 - 41 U/L 67(H) 70(H) 112(H)  ALT 0 - 44 U/L 269(H) 299(H) 441(H)     Radiology Studies: No results found.   Scheduled Meds: . dexamethasone  8 mg Oral BID  . levETIRAcetam  1,000 mg Oral BID  . pantoprazole (PROTONIX) IV  40 mg Intravenous Q12H   Continuous Infusions: . chlorproMAZINE (THORAZINE) IV 12.5 mg (07/23/20 2150)     LOS: 1 day   Time spent: 25  Nita Sells, MD [facility] To contact the attending provider between 7A-7P or the covering provider during after hours 7P-7A, please log into the web site [email or web address] and access using universal [place] password for that web site. If you do not have the password, please call the hospital operator.  07/24/2020, 3:25 PM

## 2020-07-24 NOTE — TOC Initial Note (Signed)
Transition of Care [facility]) - Initial/Assessment Note    Patient Details  Name: John Watson MRN: [id number] Date of Birth: February 05, 1961  Transition of Care [facility]) CM/SW Contact:    Trish Mage, LCSW Phone Number: 07/24/2020, 5:37 PM  Clinical Narrative:   Met with patient in response to MD message that patient is wanting to d/c with hospice services.  John Watson confirmed that he wants hospice services; initially stated he plans to go home to [place] with brother, but changed his mind during our conversation, and wondered if he could go to hospice facility in [place] as the bulk of his relative live here.  I agreed to make referral to [place].  We then talked about plan B.  I explained that if he was not found eligible for hospice facility, I would then refer him hospice of [place], and they would refer for appropriateness of level of care, which would either be home with his brother or their hospice facility.  Referal made to [facility] with [place]. TOC will continue to follow during the course of hospitalization.                Expected Discharge Plan: Home w Hospice Care Barriers to Discharge: Other (comment) (Hospice review for appropriate level of care)   Patient Goals and CMS Choice Patient states their goals for this hospitalization and ongoing recovery are:: "I'm ready to go to my final home."      Expected Discharge Plan and Services Expected Discharge Plan: Home w Hospice Care In-house Referral: Clinical Social Work     Living arrangements for the past 2 months: Single Family Home                                      Prior Living Arrangements/Services Living arrangements for the past 2 months: Single Family Home Lives with:: Siblings Patient language and need for interpreter reviewed:: Yes        Need for Family Participation in Patient Care: Yes (Comment) Care giver support system in place?: Yes (comment)   Criminal Activity/Legal  Involvement Pertinent to Current Situation/Hospitalization: No - Comment as needed  Activities of Daily Living Home Assistive Devices/Equipment: Eyeglasses,Walker (specify type),Bedside commode/3-in-1,Shower chair with back ADL Screening (condition at time of admission) Patient's cognitive ability adequate to safely complete daily activities?: Yes Is the patient deaf or have difficulty hearing?: No Does the patient have difficulty seeing, even when wearing glasses/contacts?: No Does the patient have difficulty concentrating, remembering, or making decisions?: Yes Patient able to express need for assistance with ADLs?: Yes Does the patient have difficulty dressing or bathing?: Yes Independently performs ADLs?: No (having increased weakness on left side) Communication: Independent Dressing (OT): Needs assistance Is this a change from baseline?: Pre-admission baseline Grooming: Needs assistance Is this a change from baseline?: Pre-admission baseline Feeding: Independent Bathing: Needs assistance Is this a change from baseline?: Pre-admission baseline Toileting: Needs assistance Is this a change from baseline?: Pre-admission baseline In/Out Bed: Needs assistance Is this a change from baseline?: Pre-admission baseline Walks in Home: Dependent Is this a change from baseline?: Pre-admission baseline Does the patient have difficulty walking or climbing stairs?: Yes (secondary to weakness) Weakness of Legs: Left Weakness of Arms/Hands: Left  Permission Sought/Granted                  Emotional Assessment Appearance:: Appears stated age Attitude/Demeanor/Rapport: Engaged Affect (typically observed): Stoic  Orientation: : Oriented to Self,Oriented to Place,Oriented to Situation Alcohol / Substance Use: Not Applicable Psych Involvement: No (comment)  Admission diagnosis:  Dehydration [E86.0] Rectal bleeding [K62.5] Hepatitis [K75.9] Glioblastoma ([place]) [C71.9] Brain malignancy  ([place]) [C71.9] Patient Active Problem List   Diagnosis Date Noted  . Brain malignancy ([place]) 07/23/2020  . Non-intractable vomiting   . Loss of weight   . Rectal bleeding 07/22/2020  . Choking sensation 07/14/2020  . Intractable hiccups 07/14/2020  . GERD without esophagitis 07/14/2020  . 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 Jaundice 05/16/2020  . Brain lesion 02/13/2020  . Glioblastoma of frontal lobe ([place]) 02/01/2020  . Seizure disorder ([place]) 01/16/2020  . Hyperlipidemia LDL goal <70 01/16/2020  . Tobacco use disorder 01/16/2020  . Stroke (cerebrum) [facility]) s/p tPA - possible embolic stroke vs low-grade glioma 01/14/2020   PCP:  Pcp, No Pharmacy:   [facility], [address] [place] [address] Phone: [phone] Fax: [phone]     Social Determinants of Health (SDOH) Interventions    Readmission Risk Interventions No flowsheet data found.

## 2020-07-24 NOTE — Progress Notes (Signed)

## 2020-07-24 NOTE — Consult Note (Signed)
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 male presented with GI bleeding, worsening left sided weakness.  He is currently only able to slightly flinch his left leg, and his left arm he struggles to lift against gravity.  Several days ago that leg was weak, but he was at least able to walk on it.  He feels like he is "done" with all the medical stuff and wants to transition to hospice care.  MEDICAL HISTORY:  Past Medical History:  Diagnosis Date  . Glioblastoma ([place]) 02/13/2020  . Pneumonia 2012  . Seizure ([place]) 8/16/211   last one 02/02/2020  . Stroke [facility])    Left leg weakness    SURGICAL HISTORY: Past Surgical History:  Procedure Laterality Date  . APPLICATION OF CRANIAL NAVIGATION Right 02/13/2020   Procedure: APPLICATION OF CRANIAL NAVIGATION;  Surgeon: [name], [name], DO;  Location: [place];  Service: Neurosurgery;  Laterality: Right;  APPLICATION OF CRANIAL NAVIGATION  . BUBBLE STUDY  01/17/2020   Procedure: BUBBLE STUDY;  Surgeon: [name], MD;  Location: [place];  Service: Cardiovascular;;  . ESOPHAGOGASTRODUODENOSCOPY (EGD) WITH PROPOFOL N/A 07/23/2020   Procedure: ESOPHAGOGASTRODUODENOSCOPY (EGD) WITH PROPOFOL;  Surgeon: Irene Shipper, MD;  Location: [facility];  Service: Endoscopy;  Laterality: N/A;  . FRAMELESS  BIOPSY WITH BRAINLAB Right 02/13/2020   Procedure: STEREOTACTIC BRAIN BIOPSY OF RIGHT SIDED LESION;  Surgeon: [name], [name], DO;  Location: [place];  Service: Neurosurgery;  Laterality: Right;  STEREOTACTIC BRAIN BIOPSY OF RIGHT SIDED LESION  . LOOP RECORDER INSERTION N/A 01/17/2020   Procedure: LOOP RECORDER INSERTION;  Surgeon: [name], MD;  Location: [place] [facility];  Service:  Cardiovascular;  Laterality: N/A;  . TEE WITHOUT CARDIOVERSION N/A 01/17/2020   Procedure: TRANSESOPHAGEAL ECHOCARDIOGRAM (TEE);  Surgeon: [name], MD;  Location: [facility] ENDOSCOPY;  Service: Cardiovascular;  Laterality: N/A;    SOCIAL HISTORY: Social History   Socioeconomic History  . Marital status: Single    Spouse name: Not on file  . Number of children: 1  . Years of education: Not on file  . Highest education level: Not on file  Occupational History  . Not on file  Tobacco Use  . Smoking status: Former Smoker    Years: 2.00    Types: Cigars    Quit date: 2002    Years since quitting: 20.1  . Smokeless tobacco: Never Used  Vaping Use  . Vaping Use: Never used  Substance and Sexual Activity  . Alcohol use: Not Currently  . Drug use: No  . Sexual activity: Yes    Birth control/protection: None  Other Topics Concern  . Not on file  Social History Narrative  . Not on file   Social Determinants of Health   Financial Resource Strain: Not on file  Food Insecurity: Not on file  Transportation Needs: Not on file  Physical Activity: Not on file  Stress: Not on file  Social Connections: Not on file  Intimate Partner Violence: Not on file    FAMILY HISTORY: Family History  Problem Relation Age of Onset  . Hypertension Mother   . Diabetes Mother   . Lung cancer Father   . Hypertension Sister   . Prostate cancer Brother   .  Prostate cancer Paternal Grandfather   . Hypertension Brother   . Hypertension Brother   . Hyperlipidemia Brother     ALLERGIES:  is allergic to vicodin [hydrocodone-acetaminophen].  MEDICATIONS:  Current Facility-Administered Medications  Medication Dose Route Frequency Provider Last Rate Last Admin  . chlorproMAZINE (THORAZINE) 12.5 mg in sodium chloride 0.9 % 25 mL IVPB  12.5 mg Intravenous Q6H PRN Nita Sells, MD 50 mL/hr at 07/23/20 2150 12.5 mg at 07/23/20 2150  . chlorproMAZINE (THORAZINE) tablet 25 mg  25 mg Oral  TID PRN Irene Shipper, MD      . dexamethasone (DECADRON) tablet 8 mg  8 mg Oral BID Irene Shipper, MD      . levETIRAcetam (KEPPRA) tablet 1,000 mg  1,000 mg Oral BID Nita Sells, MD      . ondansetron [facility]) tablet 4 mg  4 mg Oral Q6H PRN Irene Shipper, MD       Or  . ondansetron [facility]) injection 4 mg  4 mg Intravenous Q6H PRN Irene Shipper, MD        REVIEW OF SYSTEMS:   Constitutional: Denies fevers, chills or abnormal weight loss Eyes: Denies blurriness of vision Ears, nose, mouth, throat, and face: Denies mucositis or sore throat Respiratory: Denies cough, dyspnea or wheezes Cardiovascular: Denies palpitation, chest discomfort or lower extremity swelling Gastrointestinal:  GI bleeding, belching GU: Denies dysuria or incontinence Skin: Denies abnormal skin rashes Neurological: Per HPI Musculoskeletal: Denies joint pain, back or neck discomfort. No decrease in ROM Behavioral/Psych: Denies anxiety, disturbance in thought content, and mood instability   PHYSICAL EXAMINATION: Vitals:   07/23/20 2257 07/24/20 0358  BP: 124/80 137/77  Pulse: (!) 101   Resp: 18 16  Temp: 98 F (36.7 C) 98.7 F (37.1 C)  SpO2: 100% 100%   KPS: 50. General: Alert, cooperative, pleasant, in no acute distress Head: Craniotomy scar noted, dry and intact. EENT: ++scleral icterus Lungs: Resp effort normal Cardiac: Regular rate and rhythm Abdomen: Soft, non-distended abdomen Skin: No rashes cyanosis or petechiae. Extremities: No clubbing or edema  NEUROLOGIC EXAM: Mental Status: Awake, alert, attentive to examiner. Oriented to self and environment. Language is fluent with intact comprehension.  Cranial Nerves: Visual acuity is grossly normal. Visual fields are full. Extra-ocular movements intact. No ptosis. Face is symmetric, tongue midline. Motor: Left arm and leg atrophy. Power is 1/5 in left leg, 4/5 in left arm. Reflexes are symmetric, no pathologic reflexes present. Sensory:  Intact to light touch and temperature Gait: Deferred  LABORATORY DATA:  I have reviewed the data as listed Lab Results  Component Value Date   WBC 9.9 07/24/2020   HGB 10.7 (L) 07/24/2020   HCT 30.9 (L) 07/24/2020   MCV 96.6 07/24/2020   PLT 152 07/24/2020   Recent Labs    02/02/20 0316 02/13/20 0650 02/14/20 1122 03/27/20 1144 06/20/20 1100 06/27/20 1016 07/14/20 0036 07/14/20 0048 07/22/20 0905 07/23/20 0042 07/24/20 0402  NA 140 140 140   < >  --   --   --    < > 128* 130* 130*  K 3.7 4.1 3.8   < >  --   --   --    < > 4.3 3.9 3.4*  CL 107 107 103   < >  --   --   --    < > 94* 100 101  CO2 24 26 23    < >  --   --   --   --  22 21* 19*  GLUCOSE 100* 99 129*   < >  --   --   --    < > 126* 132* 138*  BUN 10 9 11    < >  --   --   --    < > 21* 23* 20  CREATININE 1.08 1.14 1.29*   < >  --   --   --    < > 0.56* 0.62 0.67  CALCIUM 9.1 9.5 9.8   < >  --   --   --   --  8.9 8.1* 7.9*  GFRNONAA >60 >60 >60   < >  --   --   --   --  >60 >60 >60  GFRAA >60 >60 >60  --   --   --   --   --   --   --   --   PROT 6.5  --   --    < > 5.5* 5.3* 5.4*  --  5.3* 4.3* 4.6*  ALBUMIN 3.7  --   --    < > 3.4* 3.2* 2.4*  --  2.5* 2.0* 2.1*  AST 13*  --   --    < > 124* 84* 146*  --  112* 70* 67*  ALT 17  --   --    < > 544* 296* 353*  --  441* 299* 269*  ALKPHOS 54  --   --    < > 466* 391* 384*  --  334* 241* 238*  BILITOT 1.1  --   --    < > 27.5* 26.5* 20.7*  --  19.7* 14.9* 15.4*  BILIDIR  --   --   --    < > 17.0* 12.4* 14.9*  --   --   --   --   IBILI  --   --   --   --   --   --  5.8*  --   --   --   --    < > = values in this interval not displayed.    RADIOGRAPHIC STUDIES: I have personally reviewed the radiological images as listed and agreed with the findings in the report. DG Neck Soft Tissue  Result Date: 07/14/2020 CLINICAL DATA:  Soft tissues, unable to stop belching. EXAM: NECK SOFT TISSUES - 1+ VIEW COMPARISON:  None. FINDINGS: There is no evidence of retropharyngeal  soft tissue swelling or epiglottic enlargement. The cervical airway is unremarkable and no radio-opaque foreign body identified. Degenerative changes of the cervical spine. IMPRESSION: Negative. Electronically Signed   By: Iven Finn M.D.   On: 07/14/2020 05:53   CT Head Wo Contrast  Result Date: 07/14/2020 CLINICAL DATA:  Brain mass or lesion.  History of GBM EXAM: CT HEAD WITHOUT CONTRAST TECHNIQUE: Contiguous axial images were obtained from the base of the skull through the vertex without intravenous contrast. COMPARISON:  Brain MRI from 3 days ago FINDINGS: Brain: Known necrotic mass in the high and parasagittal right frontal lobe with adjacent white matter low-density and swelling. On axial slices, the mass measures up to 4.6 x 2.8 cm. No hemorrhage, hydrocephalus, or superimposed infarct. Some of the elevated density around the mass is attributable to a recent abdominal CT with contrast. Contiguous but separate appearing cortical and subcortical low-density at a right parietal gyrus with appearance of nonenhancing tumor by prior MRI. Vascular: No hyperdense vessel or unexpected calcification. Skull: Normal. Negative for fracture or focal lesion.  Sinuses/Orbits: No acute finding. IMPRESSION: Known right cerebral GBM.  No developments since a MRI 3 days ago. Electronically Signed   By: Monte Fantasia M.D.   On: 07/14/2020 04:42   MR BRAIN W WO CONTRAST  Result Date: 07/11/2020 CLINICAL DATA:  Frontal glioblastoma.  Follow-up. EXAM: MRI HEAD WITHOUT AND WITH CONTRAST TECHNIQUE: Multiplanar, multiecho pulse sequences of the brain and surrounding structures were obtained without and with intravenous contrast. CONTRAST:  61mL GADAVIST GADOBUTROL 1 MMOL/ML IV SOLN COMPARISON:  05/29/2020 FINDINGS: Brain: Slight contraction in overall dimensions and thickness of enhancing tissue related to the necrotic mass in the medial right frontoparietal junction region. Measurements today are 3.6 x 3.5 x 2.8 cm  compared with 4.0 x 3.7 x 2.9 cm on 05/29/2020. Surrounding edema is similar, possibly minimally more extensive. Nonenhancing mass affecting a focal gyrus in the right parietal lobe appears the same. No evidence of growth or development of contrast enhancement. No hydrocephalus. No extra-axial collection. Post biopsy approach appears unremarkable. Vascular: Major vessels at the base of the brain show flow. Skull and upper cervical spine: Otherwise negative Sinuses/Orbits: Clear/normal Other: None IMPRESSION: 1. Slight contraction in overall dimensions and thickness of enhancing tissue related to the necrotic mass in the medial right frontoparietal junction region. Measurements today are 3.6 x 3.5 x 2.8 cm compared with 4.0 x 3.7 x 2.9 cm on 05/29/2020. Surrounding edema is similar, possibly minimally more extensive. 2. No evidence of growth or development of contrast enhancement affecting tumor within a focal gyrus in the right parietal lobe appears the same. Electronically Signed   By: Nelson Chimes M.D.   On: 07/11/2020 21:37   CT Abdomen Pelvis W Contrast  Result Date: 07/22/2020 CLINICAL DATA:  Bright red blood per rectum, falls EXAM: CT ABDOMEN AND PELVIS WITH CONTRAST TECHNIQUE: Multidetector CT imaging of the abdomen and pelvis was performed using the standard protocol following bolus administration of intravenous contrast. CONTRAST:  185mL OMNIPAQUE IOHEXOL 300 MG/ML  SOLN COMPARISON:  07/14/2020 FINDINGS: Lower chest: Lingula subsegmental atelectasis. Hepatobiliary: Scattered small low-density hepatic lesions characterized as cysts previously. Contracted gallbladder. No biliary dilatation. Pancreas: Unremarkable. Spleen: Unremarkable. Adrenals/Urinary Tract: Adrenals, kidneys, and bladder are unremarkable. Stomach/Bowel: Stomach is within normal limits. Bowel is normal in caliber is. Bowel is normal in caliber. Vascular/Lymphatic: Mild aortic atherosclerosis. No enlarged lymph nodes. Reproductive:  Enlarged prostate as before. Other: No ascites.  No new abnormality of the abdominal wall. Musculoskeletal: No acute or significant osseous abnormality. IMPRESSION: No acute abnormality.  No findings to account for reported symptoms. Electronically Signed   By: Macy Mis M.D.   On: 07/22/2020 11:34   CT ABDOMEN PELVIS W CONTRAST  Result Date: 07/14/2020 CLINICAL DATA:  Epigastric pain.  Elevated LFTs.  Belching. EXAM: CT ABDOMEN AND PELVIS WITH CONTRAST TECHNIQUE: Multidetector CT imaging of the abdomen and pelvis was performed using the standard protocol following bolus administration of intravenous contrast. CONTRAST:  17mL OMNIPAQUE IOHEXOL 300 MG/ML  SOLN COMPARISON:  CT 05/16/2020.  Abdominal MRI 06/23/2020 FINDINGS: Lower chest: Lingular atelectasis.  No pleural fluid. Hepatobiliary: Scattered small low-density lesions throughout the liver previously characterized as cysts. Collapsed gallbladder. No intra or extrahepatic biliary ductal dilatation. No pericholecystic fat stranding or calcified gallstone. Pancreas: Unremarkable. No pancreatic ductal dilatation or surrounding inflammatory changes. Spleen: Normal in size without focal abnormality. Adrenals/Urinary Tract: Normal adrenal glands. No hydronephrosis or perinephric edema. Tiny low-density lesion in the lower left kidney may contain fat density and represent an angiomyolipoma. Urinary bladder is  completely decompressed. Stomach/Bowel: Ingested material within the stomach. No gastric wall thickening. Normal positioning of the duodenum. No small bowel obstruction or inflammation. Cecum is slightly high-riding in the mid abdomen. Normal appendix courses centrally. Moderate volume of stool throughout the colon. No colonic wall thickening or inflammation. Vascular/Lymphatic: Aortic atherosclerosis. No aneurysm. No acute vascular findings. Patent portal vein. No enlarged lymph nodes in the abdomen or pelvis. Reproductive: Prominent prostate gland  spans 6 cm transverse. Other: No intra-abdominal ascites. No free air or focal fluid collection. Musculoskeletal: No focal bone lesion or acute osseous abnormality. IMPRESSION: 1. No acute abnormality in the abdomen/pelvis. No explanation for symptoms. 2. Scattered low-density lesions throughout the liver previously characterized as cysts. 3. Tiny low-density lesion in the lower left kidney may contain fat density and represent cyst or an angiomyolipoma. 4. Enlarged prostate gland. Aortic Atherosclerosis (ICD10-I70.0). Electronically Signed   By: Keith Rake M.D.   On: 07/14/2020 02:45   US BIOPSY (LIVER)  Result Date: 07/02/2020 INDICATION: Elevated liver function tests EXAM: Ultrasound-guided random liver biopsy MEDICATIONS: None. ANESTHESIA/SEDATION: Moderate (conscious) sedation was employed during this procedure. A total of Versed 1 mg and Fentanyl 50 mcg was administered intravenously. Moderate Sedation Time: 10 minutes. The patient's level of consciousness and vital signs were monitored continuously by radiology nursing throughout the procedure under my direct supervision. COMPLICATIONS: None immediate. PROCEDURE: Informed written consent was obtained from the patient after a thorough discussion of the procedural risks, benefits and alternatives. All questions were addressed. Maximal Sterile Barrier Technique was utilized including caps, mask, sterile gowns, sterile gloves, sterile drape, hand hygiene and skin antiseptic. A timeout was performed prior to the initiation of the procedure. Patient position supine on the ultrasound table. Right upper quadrant skin prepped and draped in usual sterile fashion. Following local lidocaine administration, Three 18 gauge cores were obtained from the right hepatic lobe utilizing continuous ultrasound guidance. Samples were sent to pathology in formalin. Needle removed and hemostasis achieved with manual compression. IMPRESSION: Ultrasound-guided random liver  biopsy as above. Electronically Signed   By: Miachel Roux M.D.   On: 07/02/2020 09:27   DG Chest Portable 1 View  Result Date: 07/14/2020 CLINICAL DATA:  Belching. EXAM: PORTABLE CHEST 1 VIEW COMPARISON:  Chest CT 05/16/2020 FINDINGS: Implanted loop recorder projects over the left chest. The heart is normal in size with normal mediastinal contours. No acute or focal airspace disease. No pleural fluid or pulmonary edema. No pneumothorax. No pulmonary mass on portable exam. No acute osseous abnormalities are seen. IMPRESSION: No acute chest findings. Electronically Signed   By: Keith Rake M.D.   On: 07/14/2020 00:46   CUP PACEART REMOTE DEVICE CHECK  Result Date: 07/02/2020 ILR summary report received. Battery status OK. Normal device function. No new symptom, tachy, brady, or pause episodes. No new AF episodes. Monthly summary reports and ROV/PRN. 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    We empathize with how difficult and uncomfortable his life has been lately, especially with the jaundice, liver biopsy, numerous GI issues.  Although recent MRI brain was stable, he presents this week with rapid clinical and neurologic (motor) progression, certainly suspicious for tumor recurrence.    We are very supportive of his desire to transition to hospice services given the clinical and emotional context.  We are available at any time for him and  his family, for further discussion, questions, or in person visit.   All questions were answered. The patient knows to call the clinic with any problems, questions or concerns.  The total time spent in the encounter was 55 minutes and more than 50% was on counseling and review of test results     Ventura Sellers, MD 07/24/2020 4:58 PM

## 2020-07-24 NOTE — Progress Notes (Signed)
Daily Progress Note   Patient Name: John Watson       Date: 07/24/2020 DOB: [birth year]  Age: 60 y.o. MRN#: [id number] Attending Physician: Nita Sells, MD Primary Care Physician: Pcp, No Admit Date: 07/22/2020  Reason for Consultation/Follow-up: Establishing goals of care  Subjective: I checked in on John Watson this evening.  He confirms that he wants to pursue hospice moving forward.  Discussed plan to transition back to his brother's with hospice support.  He states that they had also discussed possibility of residential hospice.  He will discuss hospice plans further with his brother.  Length of Stay: 1  Current Medications: Scheduled Meds:  . dexamethasone  8 mg Oral BID  . levETIRAcetam  1,000 mg Oral BID    Continuous Infusions: . chlorproMAZINE (THORAZINE) IV 12.5 mg (07/23/20 2150)    PRN Meds: chlorproMAZINE (THORAZINE) IV, chlorproMAZINE, ondansetron **OR** ondansetron (ZOFRAN) IV  Physical Exam      General: Alert, awake, in no acute distress.  HEENT: Scleral icterus Heart: Regular rate and rhythm. No murmur appreciated. Lungs: Good air movement, clear Abdomen: Soft, nontender, nondistended, positive bowel sounds.  Skin: Warm and dry     Vital Signs: BP 137/77 (BP Location: Left Arm)   Pulse (!) 101   Temp 98.7 F (37.1 C) (Oral)   Resp 16   Ht 5\' 11"  (1.803 m)   Wt 74.8 kg   SpO2 100%   BMI 23.01 kg/m  SpO2: SpO2: 100 % O2 Device: O2 Device: Room Air O2 Flow Rate: O2 Flow Rate (L/min): 4 L/min  Intake/output summary:   Intake/Output Summary (Last 24 hours) at 07/24/2020 2215 Last data filed at 07/24/2020 2015 Gross per 24 hour  Intake 1402.67 ml  Output 750 ml  Net 652.67 ml   LBM: Last BM Date: 07/22/20 Baseline Weight: Weight:  74.8 kg Most recent weight: Weight: 74.8 kg       Palliative Assessment/Data:    Flowsheet Rows   Flowsheet Row Most Recent Value  Intake Tab   Referral Department Hospitalist  Unit at Time of Referral Oncology Unit  Palliative Care Primary Diagnosis Cancer  Date Notified 07/22/20  Palliative Care Type New Palliative care  Reason for referral Clarify Goals of Care  Date of Admission 07/22/20  Date first seen by Palliative Care 07/23/20  #  of days Palliative referral response time 1 Day(s)  # of days IP prior to Palliative referral 0  Clinical Assessment   Palliative Performance Scale Score 40%  Psychosocial & Spiritual Assessment   Palliative Care Outcomes   Patient/Family meeting held? Yes  Who was at the meeting? patient      Patient Active Problem List   Diagnosis Date Noted  . Brain malignancy ([place]) 07/23/2020  . Non-intractable vomiting   . Loss of weight   . Rectal bleeding 07/22/2020  . Choking sensation 07/14/2020  . Intractable hiccups 07/14/2020  . GERD without esophagitis 07/14/2020  . 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 Jaundice 05/16/2020  . Brain lesion 02/13/2020  . Glioblastoma of frontal lobe ([place]) 02/01/2020  . Seizure disorder ([place]) 01/16/2020  . Hyperlipidemia LDL goal <70 01/16/2020  . Tobacco use disorder 01/16/2020  . 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 male  with past medical history of frontal lobe glioblastoma, seizures, CVA admitted on 07/22/2020 with BRBPR, left sided weakness, and falls.  He underwent EGD today.  Palliative consulted for [place].   Recommendations/Plan:  Plan for discharge with hospice support.  Appreciate TOC assistance with discharge planning.  Goals of Care and Additional Recommendations:  Limitations on Scope of Treatment: Full Comfort Care  Code Status:    Code Status Orders   (From admission, onward)         Start     Ordered   07/22/20 1520  Do not attempt resuscitation (DNR)  Continuous       Question Answer Comment  In the event of cardiac or respiratory ARREST Do not call a "code blue"   In the event of cardiac or respiratory ARREST Do not perform Intubation, CPR, defibrillation or ACLS   In the event of cardiac or respiratory ARREST Use medication by any route, position, wound care, and other measures to relive pain and suffering. May use oxygen, suction and manual treatment of airway obstruction as needed for comfort.      07/22/20 1520        Code Status History    Date Active Date Inactive Code Status Order ID Comments User Context   07/14/2020 0545 07/14/2020 2005 DNR [id number]  Vernelle Emerald, MD ED   05/16/2020 2157 05/20/2020 1717 Full Code [id number]  Kayleen Memos, DO ED   02/13/2020 1236 02/14/2020 2215 Full Code [id number]  [name], [name], DO Inpatient   02/01/2020 1659 02/02/2020 2235 Full Code [id number]  Oswald Hillock, MD ED   01/14/2020 1350 01/17/2020 2144 Full Code [id number]  Estill Bakes ED   Advance Care Planning Activity    Advance Directive Documentation   Flowsheet Row Most Recent Value  Type of Advance Directive Out of facility DNR (pink MOST or yellow form)  Pre-existing out of facility DNR order (yellow form or pink MOST form) --  "MOST" Form in Place? --      Care plan was discussed with patient  Thank you for allowing the Palliative Medicine Team to assist in the care of this patient.   Total Time 20 Prolonged Time Billed No      Greater than 50%  of this time was spent counseling and coordinating care related to the above assessment and plan.  Micheline Rough, MD  Please contact Palliative Medicine Team phone at [phone] for questions and concerns.

## 2020-07-24 NOTE — Progress Notes (Addendum)
Chaplain responding to RN referral.    Providing spiritual and emotional support at bedside with pt and brother.  Mr John Watson reports that his condition changed quickly and he expresses readiness for end of life.     Chaplain provided space to process feelings around change in condition, values that he holds and hopes for his time here, connection to faith tradition.    Mr John Watson is John Watson and his faith figures prominently in his frame for end of life.  He expresses readiness to be in God's presence and peace.  In our conversation, he expresses fear, and speaks of God's presence with him bringing comfort and peace.  Family support is also a John Watson - and brother at bedside spoke words of honor and comfort with Mr John Watson.   Mr John Watson expresses concern for his 42 year old son - and feels reassured that he is surrounded by uncles and family that will support him.   Chaplain shared prayers at pt request.

## 2020-07-24 NOTE — Progress Notes (Signed)
Patient refused VS, scheduled meds and some of his assessment. This nurse was successful with getting the patient to take his keppra for seizures. Patient is requesting something for increased anxiety, no PRN to administer. On-call X.Blount notified via [place]. Will continue to monitor the patient.

## 2020-07-24 NOTE — Progress Notes (Signed)
[facility])  Received request from Dr. Verlon Au to amend referral from [facility] to hospice services at home after discharge.  Chart and pt information under review by [facility] physician.  Hospice eligibility pending at this time.  Hospital liaison spoke with Al, pt's brother, to initiate education related to hospice philosophy and services and to answer any questions at this time.  Al verbalized understanding of information given.  Per discussion the plan is to discharge home tomorrow.  [place] send signed and completed DNR home with pt/family.  Please provide prescriptions at discharge as needed to ensure ongoing symptom management until patient can be admitted onto hospice services.    DME needs discussed.  A hospital bed has been ordered.  Al reports having all other needed DME at this time: bedside commode, wheelchair, and OBT. Address has been verified and is correct in the chart.  [facility] information and contact numbers given to Al.  Above information shared with Al [employer] Manager.  Please call with any questions or concerns.  Thank you for the opportunity to participate in this pt's care.  Domenic Moras, BSN, RN [employer] [phone] (24h on call)

## 2020-07-25 DIAGNOSIS — C719 Malignant neoplasm of brain, unspecified: Secondary | ICD-10-CM | POA: Diagnosis not present

## 2020-07-25 MED ORDER — HYDROXYZINE HCL 50 MG/ML IM SOLN: 25.0000 mg | Freq: Four times a day (QID) | INTRAMUSCULAR | Status: AC | PRN

## 2020-07-25 MED ORDER — SODIUM CHLORIDE 0.9 % IV SOLN: 12.5000 mg | Freq: Four times a day (QID) | INTRAVENOUS | Status: AC | PRN

## 2020-07-25 NOTE — TOC Transition Note (Signed)
Transition of Care [facility]) - CM/SW Discharge Note   Patient Details  Name: John Watson MRN: [id number] Date of Birth: Apr 21, 1961  [facility] Care [facility]) CM/SW Contact:  Trish Mage, LCSW Phone Number: 07/25/2020, 12:07 PM   Clinical Narrative:   Patient who is stable for discharge will be transferred to [facility] today.  Family alerted.  PTAR arranged.  Nursing, please call report to [phone].  TOC sign off.      Final next level of care: [place] Barriers to Discharge: Barriers Resolved   Patient Goals and CMS Choice Patient states their goals for this hospitalization and ongoing recovery are:: "I'm ready to go to my final home."      Discharge Placement                       Discharge Plan and Services In-house Referral: Clinical Social Work                                   Social Determinants of Health (SDOH) Interventions     Readmission Risk Interventions No flowsheet data found.

## 2020-07-25 NOTE — Discharge Summary (Signed)
Physician Discharge Summary  John Watson:[id number] DOB: 1960-12-09 DOA: 07/22/2020  PCP: Pcp, No  Admit date: 07/22/2020 Discharge date: 07/25/2020  Time spent: 22 minutes  Recommendations for Outpatient Follow-up:  1. Requires end-of-life care at freestanding hospice facility 2. 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  Esophagitis with hiccups belching EGD 2/23 no obvious cause for rectal bleeding GI  evaluated this is central related or related to his chemo given recently Continue  IV Thorazine i on discharge for intractable hiccups Saline lock IV Frontal lobe glioblastoma status post resection/unilateral left-sided weakness Paucity of other options per 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 Defer to outpatient GI Leukocytosis query cause Further work-up deferred Hypovolemic hyponatremia No further labs-goal is hospice Normocytic anemia Hemoglobin quite stable-see above discussions Mild metabolic acidosis and hyponatremia on admission Improving-saline as above   Consultations:  Gastroenterology  Palliative care  Discharge Exam: Vitals:   07/23/20 2257 07/24/20 0358  BP: 124/80 137/77  Pulse: (!) 101   Resp: 18 16  Temp: 98 F (36.7 C) 98.7 F (37.1 C)  SpO2: 100% 100%    Subj on day of d/c   slightly confused at this time States "it's been a hard journey" and cries Supportive listening and empathy provided  General Exam on discharge  Icteric + + Chest clear no added sound Distended abdomen ROM intact to upper extremities Poor movement of left lower extremity  Discharge Instructions   Discharge Instructions    Diet - low sodium heart healthy   Complete by: As directed    Increase activity slowly   Complete by: As directed      Allergies as of 07/25/2020      Reactions   Vicodin [hydrocodone-acetaminophen] Anaphylaxis      Medication List    STOP taking these medications   chlorproMAZINE 25 MG tablet Commonly known as: THORAZINE   dexamethasone 4 MG tablet Commonly known as: DECADRON   gabapentin 100 MG capsule Commonly known as: NEURONTIN   levETIRAcetam 1000 MG tablet Commonly known as: KEPPRA   multivitamin with minerals Tabs tablet   ondansetron 8 MG tablet Commonly known as: Zofran   pantoprazole 40 MG tablet Commonly known as: PROTONIX     TAKE these medications   chlorproMAZINE 12.5 mg in sodium chloride 0.9 % 25 mL Inject 12.5  mg into the vein every 6 (six) hours as needed.   hydrOXYzine 50 MG/ML injection Commonly  known as: VISTARIL Inject 0.5 mLs (25 mg total) into the muscle every 6 (six) hours as needed for anxiety.            Durable Medical Equipment  (From admission, onward)         Start     Ordered   07/23/20 1811  For home use only DME wheelchair cushion (seat and back)  Once        07/23/20 1814         Allergies  Allergen Reactions   Vicodin [Hydrocodone-Acetaminophen] Anaphylaxis      The results of significant diagnostics from this hospitalization (including imaging, microbiology, ancillary and laboratory) are listed below for reference.    Significant Diagnostic Studies: DG Neck Soft Tissue  Result Date: 07/14/2020 CLINICAL DATA:  Soft tissues, unable to stop belching. EXAM: NECK SOFT TISSUES - 1+ VIEW COMPARISON:  None. FINDINGS: There is no evidence of retropharyngeal soft tissue swelling or epiglottic enlargement. The cervical airway is unremarkable and no radio-opaque foreign body identified. Degenerative changes of the cervical spine. IMPRESSION: Negative. Electronically Signed   By: Iven Finn M.D.   On: 07/14/2020 05:53   CT Head Wo Contrast  Result Date: 07/14/2020 CLINICAL DATA:  Brain mass or lesion.  History of GBM EXAM: CT HEAD WITHOUT CONTRAST TECHNIQUE: Contiguous axial images were obtained from the base of the skull through the vertex without intravenous contrast. COMPARISON:  Brain MRI from 3 days ago FINDINGS: Brain: Known necrotic mass in the high and parasagittal right frontal lobe with adjacent white matter low-density and swelling. On axial slices, the mass measures up to 4.6 x 2.8 cm. No hemorrhage, hydrocephalus, or superimposed infarct. Some of the elevated density around the mass is attributable to a recent abdominal CT with contrast. Contiguous but separate appearing cortical and subcortical low-density at a right parietal gyrus with appearance of nonenhancing tumor by prior MRI. Vascular: No hyperdense vessel or unexpected calcification. Skull:  Normal. Negative for fracture or focal lesion. Sinuses/Orbits: No acute finding. IMPRESSION: Known right cerebral GBM.  No developments since a MRI 3 days ago. Electronically Signed   By: Monte Fantasia M.D.   On: 07/14/2020 04:42   MR BRAIN W WO CONTRAST  Result Date: 07/11/2020 CLINICAL DATA:  Frontal glioblastoma.  Follow-up. EXAM: MRI HEAD WITHOUT AND WITH CONTRAST TECHNIQUE: Multiplanar, multiecho pulse sequences of the brain and surrounding structures were obtained without and with intravenous contrast. CONTRAST:  24mL GADAVIST GADOBUTROL 1 MMOL/ML IV SOLN COMPARISON:  05/29/2020 FINDINGS: Brain: Slight contraction in overall dimensions and thickness of enhancing tissue related to the necrotic mass in the medial right frontoparietal junction region. Measurements today are 3.6 x 3.5 x 2.8 cm compared with 4.0 x 3.7 x 2.9 cm on 05/29/2020. Surrounding edema is similar, possibly minimally more extensive. Nonenhancing mass affecting a focal gyrus in the right parietal lobe appears the same. No evidence of growth or development of contrast enhancement. No hydrocephalus. No extra-axial collection. Post biopsy approach appears unremarkable. Vascular: Major vessels at the base of the brain show flow. Skull and upper cervical spine: Otherwise negative Sinuses/Orbits: Clear/normal Other: None IMPRESSION: 1. Slight contraction in overall dimensions and thickness of enhancing tissue related to the necrotic mass in the medial right frontoparietal junction region. Measurements today are 3.6 x 3.5 x 2.8 cm compared with 4.0 x 3.7 x 2.9 cm on 05/29/2020. Surrounding edema is similar,  possibly minimally more extensive. 2. No evidence of growth or development of contrast enhancement affecting tumor within a focal gyrus in the right parietal lobe appears the same. Electronically Signed   By: Nelson Chimes M.D.   On: 07/11/2020 21:37   CT Abdomen Pelvis W Contrast  Result Date: 07/22/2020 CLINICAL DATA:  Bright red blood  per rectum, falls EXAM: CT ABDOMEN AND PELVIS WITH CONTRAST TECHNIQUE: Multidetector CT imaging of the abdomen and pelvis was performed using the standard protocol following bolus administration of intravenous contrast. CONTRAST:  131mL OMNIPAQUE IOHEXOL 300 MG/ML  SOLN COMPARISON:  07/14/2020 FINDINGS: Lower chest: Lingula subsegmental atelectasis. Hepatobiliary: Scattered small low-density hepatic lesions characterized as cysts previously. Contracted gallbladder. No biliary dilatation. Pancreas: Unremarkable. Spleen: Unremarkable. Adrenals/Urinary Tract: Adrenals, kidneys, and bladder are unremarkable. Stomach/Bowel: Stomach is within normal limits. Bowel is normal in caliber is. Bowel is normal in caliber. Vascular/Lymphatic: Mild aortic atherosclerosis. No enlarged lymph nodes. Reproductive: Enlarged prostate as before. Other: No ascites.  No new abnormality of the abdominal wall. Musculoskeletal: No acute or significant osseous abnormality. IMPRESSION: No acute abnormality.  No findings to account for reported symptoms. Electronically Signed   By: Macy Mis M.D.   On: 07/22/2020 11:34   CT ABDOMEN PELVIS W CONTRAST  Result Date: 07/14/2020 CLINICAL DATA:  Epigastric pain.  Elevated LFTs.  Belching. EXAM: CT ABDOMEN AND PELVIS WITH CONTRAST TECHNIQUE: Multidetector CT imaging of the abdomen and pelvis was performed using the standard protocol following bolus administration of intravenous contrast. CONTRAST:  121mL OMNIPAQUE IOHEXOL 300 MG/ML  SOLN COMPARISON:  CT 05/16/2020.  Abdominal MRI 06/23/2020 FINDINGS: Lower chest: Lingular atelectasis.  No pleural fluid. Hepatobiliary: Scattered small low-density lesions throughout the liver previously characterized as cysts. Collapsed gallbladder. No intra or extrahepatic biliary ductal dilatation. No pericholecystic fat stranding or calcified gallstone. Pancreas: Unremarkable. No pancreatic ductal dilatation or surrounding inflammatory changes. Spleen: Normal  in size without focal abnormality. Adrenals/Urinary Tract: Normal adrenal glands. No hydronephrosis or perinephric edema. Tiny low-density lesion in the lower left kidney may contain fat density and represent an angiomyolipoma. Urinary bladder is completely decompressed. Stomach/Bowel: Ingested material within the stomach. No gastric wall thickening. Normal positioning of the duodenum. No small bowel obstruction or inflammation. Cecum is slightly high-riding in the mid abdomen. Normal appendix courses centrally. Moderate volume of stool throughout the colon. No colonic wall thickening or inflammation. Vascular/Lymphatic: Aortic atherosclerosis. No aneurysm. No acute vascular findings. Patent portal vein. No enlarged lymph nodes in the abdomen or pelvis. Reproductive: Prominent prostate gland spans 6 cm transverse. Other: No intra-abdominal ascites. No free air or focal fluid collection. Musculoskeletal: No focal bone lesion or acute osseous abnormality. IMPRESSION: 1. No acute abnormality in the abdomen/pelvis. No explanation for symptoms. 2. Scattered low-density lesions throughout the liver previously characterized as cysts. 3. Tiny low-density lesion in the lower left kidney may contain fat density and represent cyst or an angiomyolipoma. 4. Enlarged prostate gland. Aortic Atherosclerosis (ICD10-I70.0). Electronically Signed   By: Keith Rake M.D.   On: 07/14/2020 02:45   US BIOPSY (LIVER)  Result Date: 07/02/2020 INDICATION: Elevated liver function tests EXAM: Ultrasound-guided random liver biopsy MEDICATIONS: None. ANESTHESIA/SEDATION: Moderate (conscious) sedation was employed during this procedure. A total of Versed 1 mg and Fentanyl 50 mcg was administered intravenously. Moderate Sedation Time: 10 minutes. The patient's level of consciousness and vital signs were monitored continuously by radiology nursing throughout the procedure under my direct supervision. COMPLICATIONS: None immediate. PROCEDURE:  Informed written consent was obtained from  the patient after a thorough discussion of the procedural risks, benefits and alternatives. All questions were addressed. Maximal Sterile Barrier Technique was utilized including caps, mask, sterile gowns, sterile gloves, sterile drape, hand hygiene and skin antiseptic. A timeout was performed prior to the initiation of the procedure. Patient position supine on the ultrasound table. Right upper quadrant skin prepped and draped in usual sterile fashion. Following local lidocaine administration, Three 18 gauge cores were obtained from the right hepatic lobe utilizing continuous ultrasound guidance. Samples were sent to pathology in formalin. Needle removed and hemostasis achieved with manual compression. IMPRESSION: Ultrasound-guided random liver biopsy as above. Electronically Signed   By: Miachel Roux M.D.   On: 07/02/2020 09:27   DG Chest Portable 1 View  Result Date: 07/14/2020 CLINICAL DATA:  Belching. EXAM: PORTABLE CHEST 1 VIEW COMPARISON:  Chest CT 05/16/2020 FINDINGS: Implanted loop recorder projects over the left chest. The heart is normal in size with normal mediastinal contours. No acute or focal airspace disease. No pleural fluid or pulmonary edema. No pneumothorax. No pulmonary mass on portable exam. No acute osseous abnormalities are seen. IMPRESSION: No acute chest findings. Electronically Signed   By: Keith Rake M.D.   On: 07/14/2020 00:46   CUP PACEART REMOTE DEVICE CHECK  Result Date: 07/02/2020 ILR summary report received. Battery status OK. Normal device function. No new symptom, tachy, brady, or pause episodes. No new AF episodes. Monthly summary reports and ROV/PRN. HB   Microbiology: Recent Results (from the past 240 hour(s))  SARS CORONAVIRUS 2 (TAT 6-24 HRS) Nasopharyngeal Nasopharyngeal Swab     Status: None   Collection Time: 07/22/20  2:11 PM   Specimen: Nasopharyngeal Swab  Result Value Ref Range Status   SARS Coronavirus 2  NEGATIVE NEGATIVE Final    Comment: (NOTE) SARS-CoV-2 target nucleic acids are NOT DETECTED.  The SARS-CoV-2 RNA is generally detectable in upper and lower respiratory specimens during the acute phase of infection. Negative results do not preclude SARS-CoV-2 infection, do not rule out co-infections with other pathogens, and should not be used as the sole basis for treatment or other patient management decisions. Negative results must be combined with clinical observations, patient history, and epidemiological information. The expected result is Negative.  Fact Sheet for Patients: SugarRoll.be  Fact Sheet for Healthcare Providers: https://www.woods-mathews.com/  This test is not yet approved or cleared by the [place] FDA and  has been authorized for detection and/or diagnosis of SARS-CoV-2 by FDA under an Emergency Use Authorization (EUA). This EUA will remain  in effect (meaning this test can be used) for the duration of the COVID-19 declaration under Se ction 564(b)(1) of the Act, 21 U.S.C. section 360bbb-3(b)(1), unless the authorization is terminated or revoked sooner.  Performed at [facility], [place] [address] [place], [place] [address]      Labs: Basic Metabolic Panel: Recent Labs  Lab 07/22/20 0905 07/23/20 0042 07/24/20 0402  NA 128* 130* 130*  K 4.3 3.9 3.4*  CL 94* 100 101  CO2 22 21* 19*  GLUCOSE 126* 132* 138*  BUN 21* 23* 20  CREATININE 0.56* 0.62 0.67  CALCIUM 8.9 8.1* 7.9*   Liver Function Tests: Recent Labs  Lab 07/22/20 0905 07/23/20 0042 07/24/20 0402  AST 112* 70* 67*  ALT 441* 299* 269*  ALKPHOS 334* 241* 238*  BILITOT 19.7* 14.9* 15.4*  PROT 5.3* 4.3* 4.6*  ALBUMIN 2.5* 2.0* 2.1*   Recent Labs  Lab 07/22/20 0905  LIPASE 51   No  results for input(s): AMMONIA in the last 168 hours. CBC: Recent Labs  Lab 07/22/20 0905 07/22/20 1700 07/23/20 0042 07/24/20 0402  WBC 21.9*  --   16.9* 9.9  NEUTROABS  --   --   --  8.5*  HGB 12.1* 9.7* 10.2* 10.7*  HCT 34.3* 27.8* 29.7* 30.9*  MCV 95.3  --  97.4 96.6  PLT 197  --  156 152   Cardiac Enzymes: No results for input(s): CKTOTAL, CKMB, CKMBINDEX, TROPONINI in the last 168 hours. BNP: BNP (last 3 results) No results for input(s): BNP in the last 8760 hours.  ProBNP (last 3 results) No results for input(s): PROBNP in the last 8760 hours.  CBG: No results for input(s): GLUCAP in the last 168 hours.     Signed:  Nita Sells MD   [facility] 07/25/2020, 11:20 AM

## 2020-07-25 NOTE — Progress Notes (Signed)
Manufacturing engineer [facility]) Hospital Liaison note.    Chart reviewed and eligibility confirmed for [facility] inpatient hospice facility. Spoke with family to confirm interest and explain services. Patient and family agreeable to transfer today. TOC aware.    [facility] will notify TOC when registration paperwork has been completed to arrange transport.   RN please call report to [phone]. Please be sure a signed [place] DNR form transports with the patient.  Thank you for the opportunity to participate in this patient's care.  Domenic Moras, BSN, RN [facility] Liaison (listed on [place] under Hospice[facility])    [phone] (24h on call)

## 2020-07-26 NOTE — Progress Notes (Signed)
Daily Progress Note   Patient Name: John Watson       Date: 07/26/2020 DOB: 1960/08/05  Age: 60 y.o. MRN#: [id number] Attending Physician: No att. providers found Primary Care Physician: Pcp, No Admit Date: 07/22/2020  Reason for Consultation/Follow-up: Establishing goals of care  Subjective: I saw and examined John Watson this morning.  We had discussion regarding options for hospice services in conjunction with Chrislyn Edison Pace from [employer], who joined [place] via phone.  Discussed the potential benefits and downsides of each of these options and talked about the fact that family and hospice would support whatever decision he believes is most appropriate.  After discussion with John Watson, he reports he will be open to either transition to residential hospice (he seems to be leaning in this direction) versus transitioning home with hospice support at his brother Al's house.  He asked for [place] to reach out to his brother to see if he has a preference on which of these options he thinks would best serve John Watson.  Length of Stay: 2  Physical Exam      General: Alert, awake, in no acute distress.  HEENT: Scleral icterus Heart: Regular rate and rhythm. No murmur appreciated. Lungs: Good air movement, clear Abdomen: Soft, nontender, nondistended, positive bowel sounds.  Skin: Warm and dry     Vital Signs: BP 137/77 (BP Location: Left Arm)   Pulse (!) 101   Temp 98.7 F (37.1 C) (Oral)   Resp 16   Ht 5\' 11"  (1.803 m)   Wt 74.8 kg   SpO2 100%   BMI 23.01 kg/m  SpO2: SpO2: 100 % O2 Device: O2 Device: Room Air O2 Flow Rate: O2 Flow Rate (L/min): 4 L/min  Intake/output summary:   Intake/Output Summary (Last 24 hours) at 07/26/2020 1222 Last data filed at 07/25/2020  1400 Gross per 24 hour  Intake --  Output 200 ml  Net -200 ml   LBM: Last BM Date: 07/22/20 Baseline Weight: Weight: 74.8 kg Most recent weight: Weight: 74.8 kg       Palliative Assessment/Data:    Flowsheet Rows   Flowsheet Row Most Recent Value  Intake Tab   Referral Department Hospitalist  Unit at Time of Referral Oncology Unit  Palliative Care Primary Diagnosis Cancer  Date Notified 07/22/20  Palliative Care Type  New Palliative care  Reason for referral Clarify Goals of Care  Date of Admission 07/22/20  Date first seen by Palliative Care 07/23/20  # of days Palliative referral response time 1 Day(s)  # of days IP prior to Palliative referral 0  Clinical Assessment   Palliative Performance Scale Score 40%  Psychosocial & Spiritual Assessment   Palliative Care Outcomes   Patient/Family meeting held? Yes  Who was at the meeting? patient      Patient Active Problem List   Diagnosis Date Noted  . Brain malignancy ([facility]) 07/23/2020  . Non-intractable vomiting   . Loss of weight   . Rectal bleeding 07/22/2020  . Choking sensation 07/14/2020  . Intractable hiccups 07/14/2020  . GERD without esophagitis 07/14/2020  . 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 Jaundice 05/16/2020  . Brain lesion 02/13/2020  . Glioblastoma of frontal lobe ([place]) 02/01/2020  . Seizure disorder ([place]) 01/16/2020  . Hyperlipidemia LDL goal <70 01/16/2020  . Tobacco use disorder 01/16/2020  . 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 male  with past medical history of frontal lobe glioblastoma, seizures, CVA admitted on 07/22/2020 with BRBPR, left sided weakness, and falls.  He underwent EGD today.  Palliative consulted for [place].   Recommendations/Plan:  Plan for discharge with hospice support.  Overall, his symptoms seem well controlled today.    Plan for discharge  from the hospital with hospice support.  Option of home with hospice versus residential hospice to be clarified by [facility] after they speak with his brother, Al.  Goals of Care and Additional Recommendations:  Limitations on Scope of Treatment: Full Comfort Care  Code Status:    Code Status Orders  (From admission, onward)         Start     Ordered   07/22/20 1520  Do not attempt resuscitation (DNR)  Continuous       Question Answer Comment  In the event of cardiac or respiratory ARREST Do not call a "code blue"   In the event of cardiac or respiratory ARREST Do not perform Intubation, CPR, defibrillation or ACLS   In the event of cardiac or respiratory ARREST Use medication by any route, position, wound care, and other measures to relive pain and suffering. May use oxygen, suction and manual treatment of airway obstruction as needed for comfort.      07/22/20 1520        Code Status History    Date Active Date Inactive Code Status Order ID Comments User Context   07/14/2020 0545 07/14/2020 2005 DNR [id number]  Vernelle Emerald, MD ED   05/16/2020 2157 05/20/2020 1717 Full Code [id number]  Kayleen Memos, DO ED   02/13/2020 1236 02/14/2020 2215 Full Code [id number]  [name], [name], DO Inpatient   02/01/2020 1659 02/02/2020 2235 Full Code [id number]  Oswald Hillock, MD ED   01/14/2020 1350 01/17/2020 2144 Full Code [id number]  Estill Bakes ED   Advance Care Planning Activity    Advance Directive Documentation   Flowsheet Row Most Recent Value  Type of Advance Directive Out of facility DNR (pink MOST or yellow form)  Pre-existing out of facility DNR order (yellow form or pink MOST form) --  "MOST" Form in Place? --      Care plan was discussed with patient  Thank you for allowing the Palliative Medicine Team to  assist in the care of this patient.   Total Time 25 Prolonged Time Billed No   Greater than 50%  of this time was spent counseling and coordinating care related to  the above assessment and plan.  Micheline Rough, MD  Please contact Palliative Medicine Team phone at [phone] for questions and concerns.

## 2020-07-28 ENCOUNTER — Other Ambulatory Visit: Payer: Self-pay | Admitting: *Deleted

## 2020-07-28 NOTE — Patient Outreach (Signed)
[facility]) Care Management  07/28/2020  CHASEN MENDELL 1960-08-03 [id number]  Transition of Care  Case Closure   Referral received : 07/24/20 Insurance: [facility]   Referral received for transition of care- Patient discharged from [facility] 07/25/20 to [facility].    Plan Will plan [facility] care management case closure .    Joylene Draft, RN, BSN  [facility] Management Coordinator  [phone]- Mobile [phone]- Toll Free Main Office

## 2020-07-29 ENCOUNTER — Inpatient Hospital Stay: Payer: No Typology Code available for payment source | Admitting: Internal Medicine

## 2020-07-29 ENCOUNTER — Inpatient Hospital Stay: Payer: No Typology Code available for payment source

## 2020-08-11 ENCOUNTER — Ambulatory Visit (INDEPENDENT_AMBULATORY_CARE_PROVIDER_SITE_OTHER): Payer: No Typology Code available for payment source

## 2020-08-11 DIAGNOSIS — I639 Cerebral infarction, unspecified: Secondary | ICD-10-CM

## 2020-08-13 LAB — CUP PACEART REMOTE DEVICE CHECK
Date Time Interrogation Session: 20220306225549
Implantable Pulse Generator Implant Date: 20210819

## 2020-08-19 ENCOUNTER — Ambulatory Visit: Payer: No Typology Code available for payment source | Admitting: Gastroenterology

## 2020-08-19 NOTE — Progress Notes (Signed)
Carelink Summary Report / Loop Recorder 

## 2020-08-26 ENCOUNTER — Other Ambulatory Visit (HOSPITAL_COMMUNITY): Payer: Self-pay

## 2022-07-10 IMAGING — MR MR 3D RECON AT [facility]
18 of 20 series · 41 of 48 positions shown · IV contrast (gadavist)
Comparison: CT 05/16/2020

CLINICAL DATA: Elevated LFTs.  Bloating.  Jaundice.

EXAM:
MRI ABDOMEN WITHOUT AND WITH CONTRAST (INCLUDING MRCP)
TECHNIQUE: Multiplanar multisequence MR imaging of the abdomen was performed
both before and after the administration of intravenous contrast.
Heavily T2-weighted images of the biliary and pancreatic ducts were
obtained, and three-dimensional MRCP images were rendered by post
processing.
CONTRAST:  8mL GADAVIST GADOBUTROL 1 MMOL/ML IV SOLN

[Series 3: T2 fat-sat · axial · 6.0mm · 1.25mm/px · 1 of 40 slices shown]
[im 1/40]
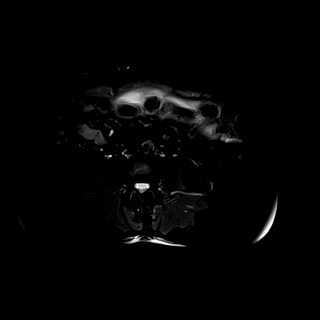

[Series 6: cor_3d_spc_trig · coronal · 1.0mm · 0.49mm/px · 3 of 88 slices shown]
[im 1/88]
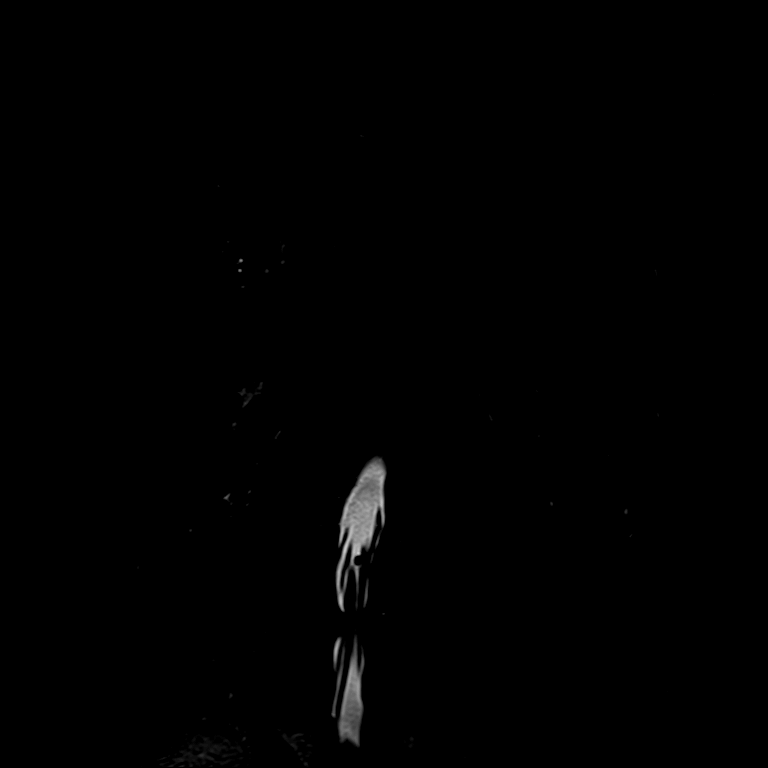
[im 44/88]
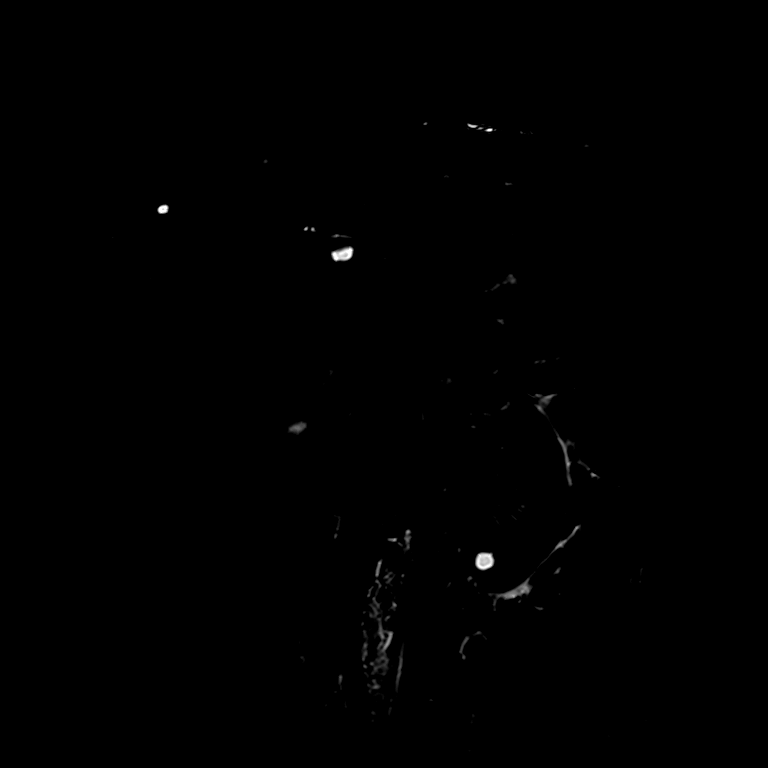
[im 88/88]
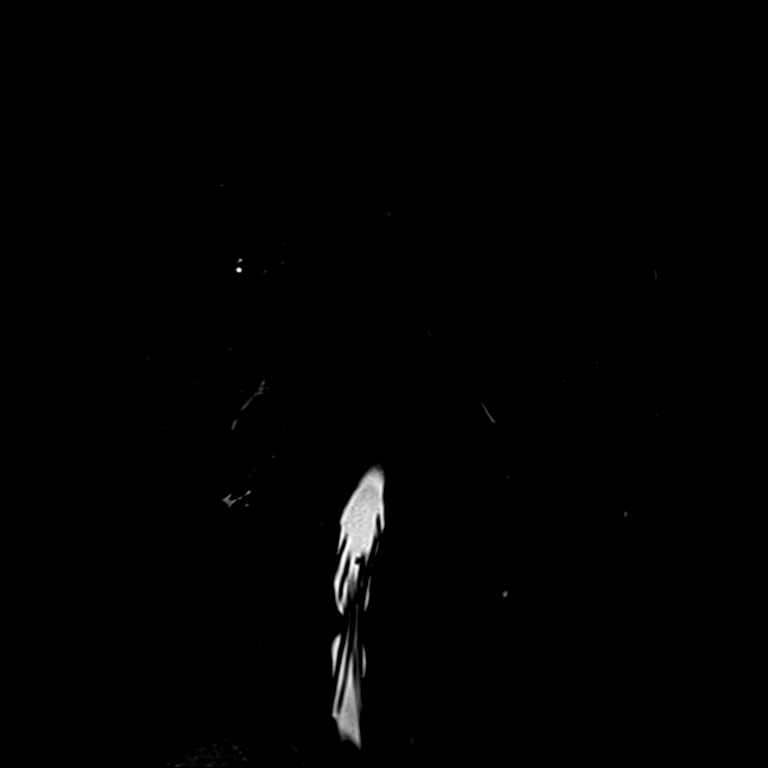

[Series 9: T2 · coronal · 6.0mm · 1.56mm/px · 1 of 36 slices shown (1 of 2)]
[im 1/36]
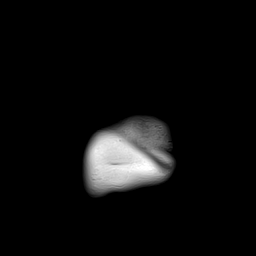

[Series 11: DWI · axial · 6.0mm · 1.49mm/px · z∈[-106,+175]mm · 3 of 80 slices shown (1 of 2)]
[im 1/80]
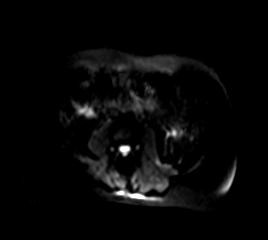
[im 40/80]
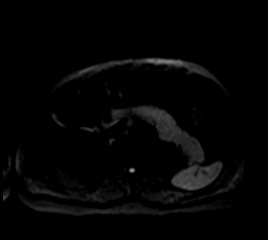
[im 80/80]
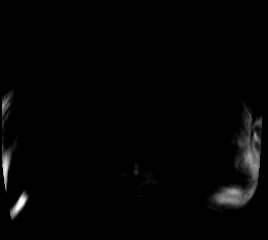

[Series 12: DWI · axial · 6.0mm · 1.49mm/px · 1 of 40 slices shown (2 of 2)]
[im 1/40]
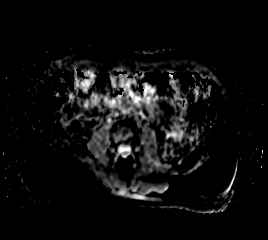

[Series 13: T1 · axial · 3.0mm · 1.25mm/px · z∈[-124,+137]mm · 3 of 88 slices shown (1 of 2)]
[im 1/88]
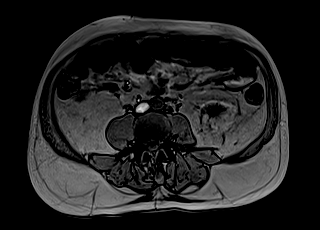
[im 44/88]
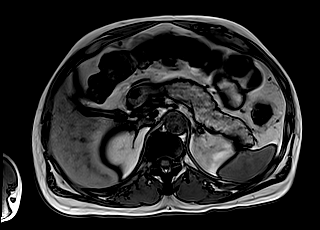
[im 88/88]
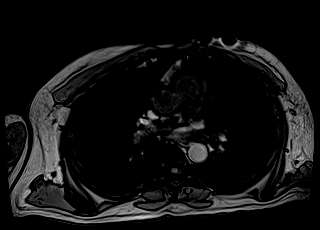

[Series 14: T1 · axial · 3.0mm · 1.25mm/px · z∈[-124,+137]mm · 3 of 88 slices shown (2 of 2)]
[im 1/88]
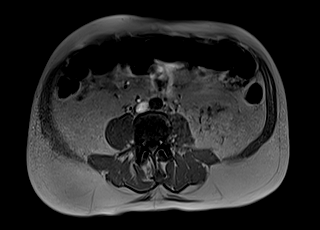
[im 44/88]
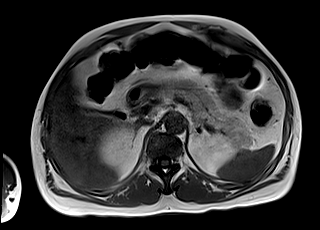
[im 88/88]
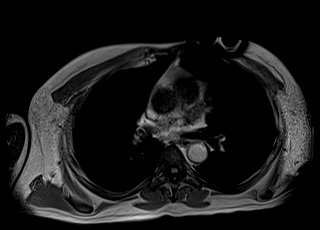

[Series 15: cor obl thk · sagittal · 50.0mm · 0.78mm/px · 1 of 9 slices shown]
[im 1/9]
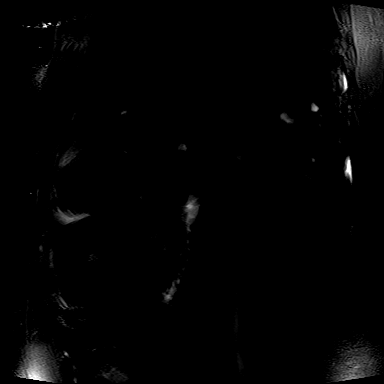

[Series 16: T2 · axial · 6.0mm · 1.48mm/px · 1 of 40 slices shown (2 of 2)]
[im 1/40]
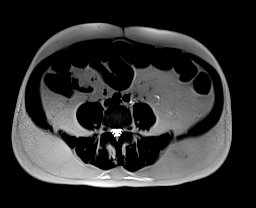

[Series 18: T1 dynamic · axial · 3.0mm · 1.25mm/px · z∈[-129,+132]mm · 3 of 88 slices shown (1 of 6)]
[im 1/88]
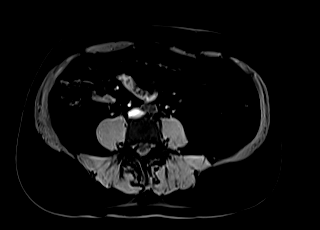
[im 44/88]
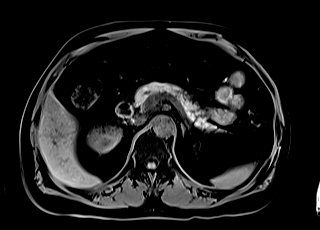
[im 88/88]
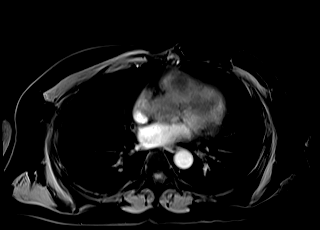

[Series 21: T1 dynamic · axial · 3.0mm · 1.25mm/px · z∈[-129,+132]mm · 3 of 88 slices shown (2 of 6)]
[im 1/88]
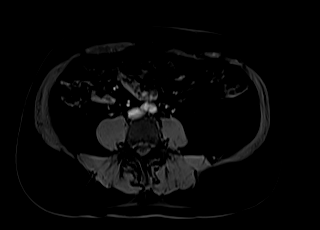
[im 44/88]
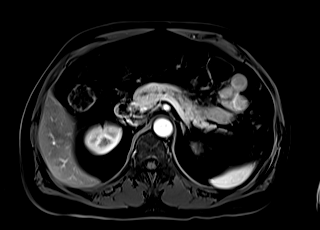
[im 88/88]
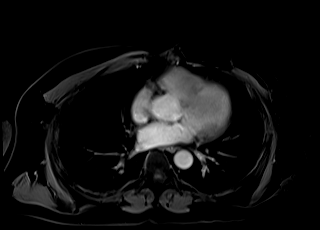

[Series 23: T1 dynamic · axial · 3.0mm · 1.25mm/px · z∈[-129,+132]mm · 3 of 88 slices shown (3 of 6)]
[im 1/88]
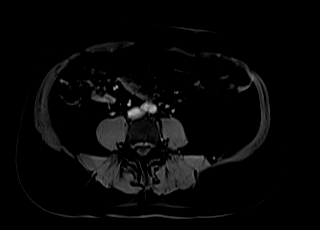
[im 44/88]
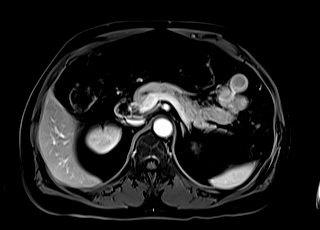
[im 88/88]
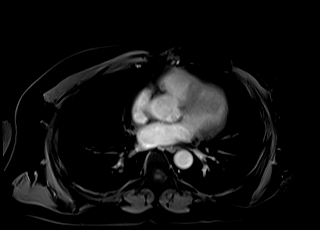

[Series 25: T1 dynamic · axial · 3.0mm · 1.25mm/px · z∈[-129,+132]mm · 3 of 88 slices shown (4 of 6)]
[im 1/88]
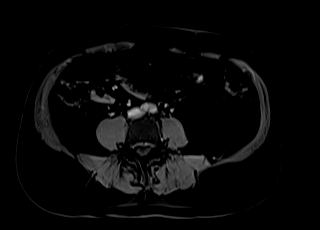
[im 44/88]
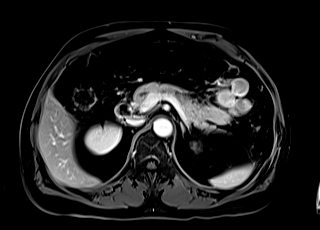
[im 88/88]
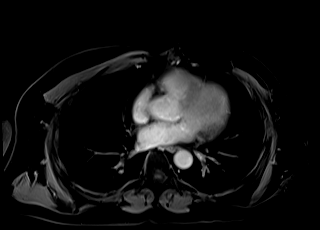

[Series 27: T1 dynamic · coronal · 5.0mm · 1.41mm/px · 2 of 52 slices shown (5 of 6)]
[im 1/52]
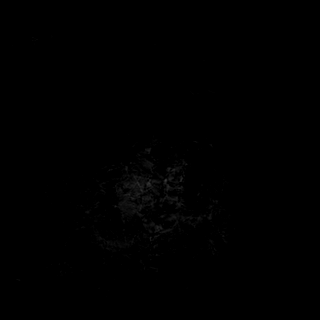
[im 52/52]
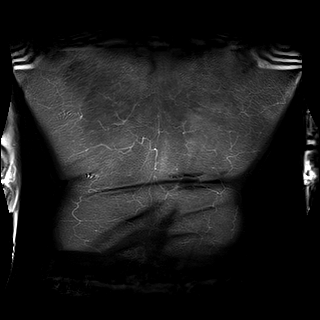

[Series 29: T1 dynamic · axial · 3.0mm · 1.25mm/px · z∈[-129,+132]mm · 3 of 88 slices shown (6 of 6)]
[im 1/88]
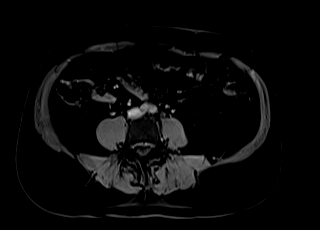
[im 44/88]
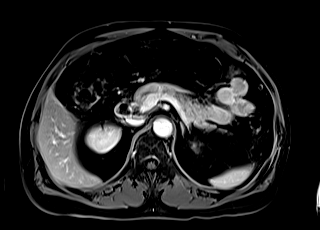
[im 88/88]
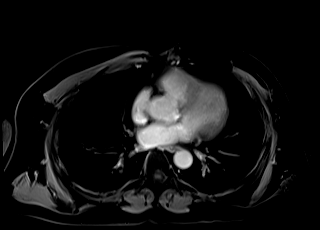

[Series 101: sub 20 sec · axial · 3.0mm · 1.25mm/px · z∈[-129,+132]mm · 3 of 88 slices shown]
[im 1/88]
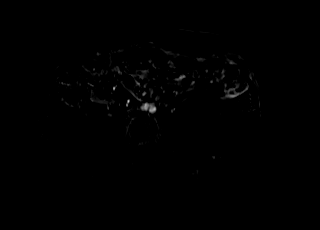
[im 44/88]
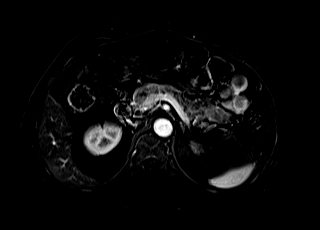
[im 88/88]
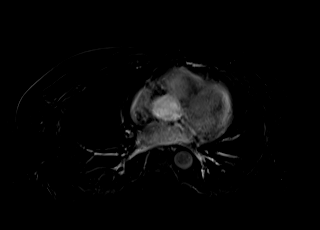

[Series 102: sub 45 sec · axial · 3.0mm · 1.25mm/px · z∈[-129,+132]mm · 3 of 88 slices shown]
[im 1/88]
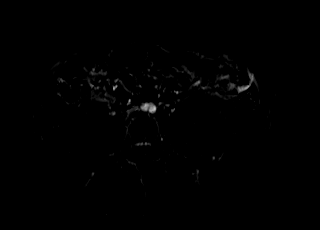
[im 44/88]
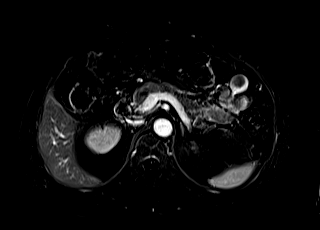
[im 88/88]
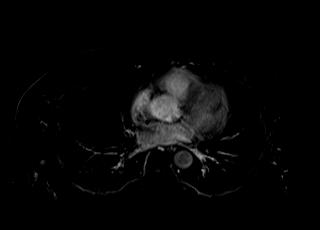

[Series 103: sub 90 sec · axial · 3.0mm · 1.25mm/px · 1 of 88 slices shown]
[im 1/88]
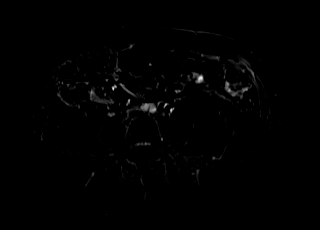

[41 of 48 positions shown; findings below may reference images not displayed]

FINDINGS: Lower chest:  Lung bases are clear.

Hepatobiliary: Scattered small hepatic cysts within LEFT and RIGHT
hepatic lobe. No intrahepatic biliary duct dilatation. The common
bile duct is normal caliber. Gallbladder is collapsed. Liver
parenchyma appears normal. No evidence of cirrhosis. No hepatic
steatosis.

Postcontrast enhanced imaging demonstrates no enhancing hepatic
lesion.

Pancreas: Pancreatic parenchyma appears normal. No variant
pancreatic ductal anatomy.

Spleen: Normal spleen.

Adrenals/urinary tract: Adrenal glands and kidneys are normal.

Stomach/Bowel: Stomach and limited of the small bowel is
unremarkable

Vascular/Lymphatic: Abdominal aortic normal caliber. No
retroperitoneal periportal lymphadenopathy.

Musculoskeletal: No aggressive osseous lesion
IMPRESSION: 1. No evidence of biliary obstruction. No intrahepatic or
extrahepatic duct dilatation. Normal common bile duct.
2. Gallbladder collapsed.
3. No hepatic parenchymal abnormality other than multiple small
benign hepatic cysts.
4. Normal pancreas.
5. No explanation for elevated bilirubin.
